# Patient Record
Sex: Female | Born: 1935 | Race: White | Hispanic: No | Smoking: Never smoker
Health system: Southern US, Community
[De-identification: ages and names within clinical notes are randomized; demographics above are authoritative.]

## PROBLEM LIST (undated history)

## (undated) DIAGNOSIS — I10 Essential (primary) hypertension: Secondary | ICD-10-CM

## (undated) DIAGNOSIS — F0390 Unspecified dementia without behavioral disturbance: Secondary | ICD-10-CM

---

## 2013-06-23 ENCOUNTER — Inpatient Hospital Stay: Payer: Self-pay | Admitting: Orthopedic Surgery

## 2013-06-23 LAB — URINALYSIS, COMPLETE
Bacteria: NONE SEEN
Bilirubin,UR: NEGATIVE
Blood: NEGATIVE
GLUCOSE, UR: NEGATIVE mg/dL (ref 0–75)
Leukocyte Esterase: NEGATIVE
Nitrite: NEGATIVE
Ph: 5 (ref 4.5–8.0)
Protein: NEGATIVE
Specific Gravity: 1.024 (ref 1.003–1.030)
Squamous Epithelial: <1
WBC UR: 1 /HPF (ref 0–5)

## 2013-06-23 LAB — CBC
HCT: 39.9 % (ref 35.0–47.0)
HGB: 13.8 g/dL (ref 12.0–16.0)
MCH: 29.2 pg (ref 26.0–34.0)
MCHC: 34.5 g/dL (ref 32.0–36.0)
MCV: 85 fL (ref 80–100)
PLATELETS: 178 10*3/uL (ref 150–440)
RBC: 4.71 10*6/uL (ref 3.80–5.20)
RDW: 13.6 % (ref 11.5–14.5)
WBC: 8.6 10*3/uL (ref 3.6–11.0)

## 2013-06-23 LAB — COMPREHENSIVE METABOLIC PANEL
Albumin: 3.4 g/dL (ref 3.4–5.0)
Alkaline Phosphatase: 75 U/L
Anion Gap: 5 — ABNORMAL LOW (ref 7–16)
BUN: 16 mg/dL (ref 7–18)
Bilirubin,Total: 0.7 mg/dL (ref 0.2–1.0)
CO2: 29 mmol/L (ref 21–32)
Calcium, Total: 9 mg/dL (ref 8.5–10.1)
Chloride: 107 mmol/L (ref 98–107)
Creatinine: 0.84 mg/dL (ref 0.60–1.30)
EGFR (African American): >60
EGFR (Non-African Amer.): >60
Glucose: 96 mg/dL (ref 65–99)
OSMOLALITY: 282 (ref 275–301)
POTASSIUM: 3.6 mmol/L (ref 3.5–5.1)
SGOT(AST): 33 U/L (ref 15–37)
SGPT (ALT): 23 U/L (ref 12–78)
SODIUM: 141 mmol/L (ref 136–145)
Total Protein: 6.9 g/dL (ref 6.4–8.2)

## 2013-06-23 LAB — APTT
ACTIVATED PTT: 23.9 s (ref 23.6–35.9)
Activated PTT: <23 secs — ABNORMAL LOW (ref 23.6–35.9)

## 2013-06-23 LAB — PROTIME-INR
INR: 1.1
INR: 1.1
PROTHROMBIN TIME: 13.6 s (ref 11.5–14.7)
Prothrombin Time: 13.9 secs (ref 11.5–14.7)

## 2013-06-24 LAB — CBC WITH DIFFERENTIAL/PLATELET
Basophil #: 0 10*3/uL (ref 0.0–0.1)
Basophil %: 0.5 %
Eosinophil #: 0.1 10*3/uL (ref 0.0–0.7)
Eosinophil %: 1.5 %
HCT: 36.2 % (ref 35.0–47.0)
HGB: 12.4 g/dL (ref 12.0–16.0)
LYMPHS ABS: 1.1 10*3/uL (ref 1.0–3.6)
LYMPHS PCT: 14.6 %
MCH: 28.7 pg (ref 26.0–34.0)
MCHC: 34.2 g/dL (ref 32.0–36.0)
MCV: 84 fL (ref 80–100)
Monocyte #: 1 x10 3/mm — ABNORMAL HIGH (ref 0.2–0.9)
Monocyte %: 13.7 %
NEUTROS PCT: 69.7 %
Neutrophil #: 5.2 10*3/uL (ref 1.4–6.5)
Platelet: 157 10*3/uL (ref 150–440)
RBC: 4.32 10*6/uL (ref 3.80–5.20)
RDW: 13.3 % (ref 11.5–14.5)
WBC: 7.5 10*3/uL (ref 3.6–11.0)

## 2013-06-24 LAB — BASIC METABOLIC PANEL
Anion Gap: 6 — ABNORMAL LOW (ref 7–16)
BUN: 16 mg/dL (ref 7–18)
CALCIUM: 8.6 mg/dL (ref 8.5–10.1)
CHLORIDE: 107 mmol/L (ref 98–107)
Co2: 27 mmol/L (ref 21–32)
Creatinine: 0.8 mg/dL (ref 0.60–1.30)
EGFR (Non-African Amer.): >60
Glucose: 101 mg/dL — ABNORMAL HIGH (ref 65–99)
OSMOLALITY: 281 (ref 275–301)
Potassium: 3.1 mmol/L — ABNORMAL LOW (ref 3.5–5.1)
Sodium: 140 mmol/L (ref 136–145)

## 2013-06-24 LAB — MAGNESIUM: MAGNESIUM: 1.5 mg/dL — AB

## 2013-06-25 LAB — CBC WITH DIFFERENTIAL/PLATELET
BASOS ABS: 0 10*3/uL (ref 0.0–0.1)
Basophil %: 0.1 %
EOS ABS: 0 10*3/uL (ref 0.0–0.7)
Eosinophil %: 0 %
HCT: 30.6 % — AB (ref 35.0–47.0)
HGB: 10.8 g/dL — AB (ref 12.0–16.0)
Lymphocyte #: 0.6 10*3/uL — ABNORMAL LOW (ref 1.0–3.6)
Lymphocyte %: 5.5 %
MCH: 29.3 pg (ref 26.0–34.0)
MCHC: 35.2 g/dL (ref 32.0–36.0)
MCV: 83 fL (ref 80–100)
MONO ABS: 0.7 x10 3/mm (ref 0.2–0.9)
MONOS PCT: 6.9 %
NEUTROS ABS: 9.1 10*3/uL — AB (ref 1.4–6.5)
NEUTROS PCT: 87.5 %
Platelet: 134 10*3/uL — ABNORMAL LOW (ref 150–440)
RBC: 3.67 10*6/uL — AB (ref 3.80–5.20)
RDW: 13.1 % (ref 11.5–14.5)
WBC: 10.4 10*3/uL (ref 3.6–11.0)

## 2013-06-25 LAB — BASIC METABOLIC PANEL
Anion Gap: 5 — ABNORMAL LOW (ref 7–16)
BUN: 10 mg/dL (ref 7–18)
CHLORIDE: 109 mmol/L — AB (ref 98–107)
CREATININE: 0.81 mg/dL (ref 0.60–1.30)
Calcium, Total: 7.8 mg/dL — ABNORMAL LOW (ref 8.5–10.1)
Co2: 27 mmol/L (ref 21–32)
EGFR (African American): >60
EGFR (Non-African Amer.): >60
Glucose: 135 mg/dL — ABNORMAL HIGH (ref 65–99)
Osmolality: 282 (ref 275–301)
POTASSIUM: 3.8 mmol/L (ref 3.5–5.1)
Sodium: 141 mmol/L (ref 136–145)

## 2013-06-25 LAB — MAGNESIUM: Magnesium: 2.2 mg/dL

## 2013-06-26 LAB — CBC WITH DIFFERENTIAL/PLATELET
Basophil #: 0 10*3/uL (ref 0.0–0.1)
Basophil %: 0.3 %
Eosinophil #: 0.2 10*3/uL (ref 0.0–0.7)
Eosinophil %: 1.7 %
HCT: 28.6 % — AB (ref 35.0–47.0)
HGB: 9.6 g/dL — ABNORMAL LOW (ref 12.0–16.0)
Lymphocyte #: 1.9 10*3/uL (ref 1.0–3.6)
Lymphocyte %: 20.8 %
MCH: 28.5 pg (ref 26.0–34.0)
MCHC: 33.6 g/dL (ref 32.0–36.0)
MCV: 85 fL (ref 80–100)
MONO ABS: 1 x10 3/mm — AB (ref 0.2–0.9)
Monocyte %: 11.5 %
NEUTROS PCT: 65.7 %
Neutrophil #: 5.9 10*3/uL (ref 1.4–6.5)
Platelet: 147 10*3/uL — ABNORMAL LOW (ref 150–440)
RBC: 3.38 10*6/uL — AB (ref 3.80–5.20)
RDW: 13.6 % (ref 11.5–14.5)
WBC: 8.9 10*3/uL (ref 3.6–11.0)

## 2013-06-26 LAB — MAGNESIUM: Magnesium: 1.8 mg/dL

## 2013-06-26 LAB — POTASSIUM: POTASSIUM: 3.8 mmol/L (ref 3.5–5.1)

## 2013-06-27 LAB — CBC WITH DIFFERENTIAL/PLATELET
Basophil #: 0 10*3/uL (ref 0.0–0.1)
Basophil %: 0.5 %
EOS ABS: 0.2 10*3/uL (ref 0.0–0.7)
EOS PCT: 3.2 %
HCT: 27.7 % — ABNORMAL LOW (ref 35.0–47.0)
HGB: 9.5 g/dL — ABNORMAL LOW (ref 12.0–16.0)
LYMPHS PCT: 25.9 %
Lymphocyte #: 1.8 10*3/uL (ref 1.0–3.6)
MCH: 29.4 pg (ref 26.0–34.0)
MCHC: 34.4 g/dL (ref 32.0–36.0)
MCV: 86 fL (ref 80–100)
Monocyte #: 0.9 x10 3/mm (ref 0.2–0.9)
Monocyte %: 13.4 %
NEUTROS PCT: 57 %
Neutrophil #: 4 10*3/uL (ref 1.4–6.5)
Platelet: 146 10*3/uL — ABNORMAL LOW (ref 150–440)
RBC: 3.24 10*6/uL — ABNORMAL LOW (ref 3.80–5.20)
RDW: 13.5 % (ref 11.5–14.5)
WBC: 7 10*3/uL (ref 3.6–11.0)

## 2014-09-09 NOTE — Op Note (Signed)
PATIENT NAME:  [name] [name], [name] [name] MR#:  [id number] DATE OF BIRTH:  [birth year]  DATE OF PROCEDURE:  06/24/2013  PREOPERATIVE DIAGNOSIS: Right tibia and fibula fracture, closed.   POSTOPERATIVE DIAGNOSIS: Right tibia and fibula fracture, closed.   PROCEDURE: Intramedullary fixation of right tibia fracture.   ANESTHESIA: General with LMA.   SURGEON: Juanell Fairly, M.D.   ESTIMATED BLOOD LOSS: 100 mL.   COMPLICATIONS: None.   IMPLANT: Biomet 10 mm x 330 mm VersaNail.   INDICATIONS FOR THE PROCEDURE: The patient sustained a fall when she tripped over a curb. She sustained a spiral fracture in the distal third of her tibia with an associated fracture of the proximal fibula with mild displacement. This 81 year old female was very active and a community ambulator prior to her injury. I had recommended an intramedullary fixation for her fracture. I reviewed the risks and benefits of surgery with the patient and her family. The risks include infection, bleeding, nerve or blood vessel injury, nonunion, malunion, hardware failure, knee or ankle pain, failure to return to ambulation and the need for further surgery. Medical risks include, but are not limited to, DVT and pulmonary embolism, myocardial infarction, stroke, pneumonia, respiratory failure and death. The patient understood these risks and wished to proceed with surgery.   PROCEDURE NOTE: The patient was brought to the operating room where she underwent general anesthesia with placement of an LMA. The patient had had her right leg marked with the word yes by me according to the hospital's right site protocol. She was prepped and draped in a sterile fashion. A timeout was performed to verify the patient's name, date of birth, medical record number, correct site of surgery and correct procedure to be performed. It was also used to verify the patient had received antibiotics and that all appropriate instruments, implants and radiographic studies were  available in the room. Once all in attendance were in agreement, the case began.   The patient's right leg was placed on a triangle with distal traction. The ankle had been Cobaned to the distal leg holder on the triangle. The patient had a closed reduction using C-arm at the start of the case. Once adequate reduction of the fracture had been achieved, a midline incision inferior to the patella was made with a #10 blade. A deep #10 blade was then used to make a small medial arthrotomy, taking care not to injure the patella tendon. A guidepin was then drilled through the proximal tibia and advanced into the tibial canal. The guidepin position was confirmed using C-arm imaging in both the AP and lateral planes. Once adequate position of the drill pin was achieved, a starting awl was advanced over the guidepin and into the proximal tibia. Once the starting hole had been created, a ball-tip guidewire was advanced into the proximal tibia, down the shaft and across the fracture site. The distal portion of the ball-tip guidewire was evaluated again using C-arm in both the AP and lateral planes. It was found to be crossing the fracture site. A depth gauge was used to measure the guidepin length, and it was found to be 330 mm long. Sequential reamers were then passed over the ball-tip guidewire and across the fracture site. The patient had significant reamer chatter at approximately 11.5 mm reamer. The decision was made, therefore, to insert a 10 mm diameter x 330 mm tibial VersaNail. This was advanced into position, malleting the inserter gently. The position of the nail was checked at the ankle, the knee  and at the fracture site both in the AP and lateral planes. Once it was determined that the nail was in adequate position and the fracture remained well reduced, the proximal guide arm was used to insert an oblique proximal interlocking screw as well as a screw through the dynamic hole laterally. The length of these  screws was measured with a depth gauge prior to their insertion. These screws were inserted through the guide arm, allowing for accurate placement. The proximal guide arm was then removed. Attention was then turned to placement of distal interlocking screws. Two distal interlocking screws were placed from a medial to lateral direction using a perfect circle freehand technique. Again, the depth of the screws was measured using a depth gauge. Once both screws were in position, final images of the intramedullary nail and fracture reduction were performed using C-arm. All wounds were then copiously irrigated. The distal and proximal interlocking stab wounds were closed with skin staples. The medial arthrotomy was closed with 0 Vicryl, the subcutaneous tissue closed with 2-0 Vicryl and the skin approximated with staples. Dry sterile dressings were applied, along with an AO splint. The patient was then awakened, transferred to a hospital bed and brought to the PACU in stable condition. I was scrubbed and present for the entire case, and all sharp and instrument counts were correct at the conclusion of the case. I spoke with the patient's family in the postop consultation room to let them know the case had gone without complication and the patient was stable in the recovery room.    ____________________________ Kathreen DevoidKevin L. Nekia Maxham, MD klk:gb D: 06/29/2013 22:31:53 ET T: 06/29/2013 23:19:41 ET JOB#: [id number]  cc: Kathreen DevoidKevin L. Tywanda Rice, MD, <Dictator> Kathreen DevoidKEVIN L Denyla Cortese MD ELECTRONICALLY SIGNED 06/30/2013 10:43

## 2014-09-09 NOTE — H&P (Signed)
Subjective/Chief Complaint Right leg pain.   History of Present Illness Patient is an 81 year old female who tripped on a curb today sustaining an injury to her right leg.  She denies other injuries or LOC.  She is seen in the ER with her son and daughter in law.  Patient denies numbness or tingling in the right leg.  She denies any significant pain and states she has not required pain medication.  A posterior splint was placed on the leg.   Past Med/Surgical Hx:  Hypertension:   hysterectomy:   Rt hip replacement:   ALLERGIES:  NKDA: None  HOME MEDICATIONS: Medication Instructions Status  dorzolamide-timolol ophthalmic 2.23%-0.68% ophthalmic solution 1 drop(s) to each affected eye 2 times a day Active  hydrochlorothiazide-valsartan 12.5 mg-320 mg oral tablet 1 tab(s) orally once a day Active  potassium chloride 20 mEq oral tablet, extended release 1 tab(s) orally once a day Active  Taztia XT 180 mg/24 hours oral capsule, extended release 1 cap(s) orally once a day Active   Family and Social History:  Social History negative tobacco   Place of Living Home   Review of Systems:  Subjective/Chief Complaint Right leg pain   Physical Exam:  GEN no acute distress   HEENT hearing intact to voice, Oropharynx clear, hard of hearing   RESP normal resp effort   EXTR Right lower leg is wrapped in a posterior splint which only covers the leg.  The foot is completely uncovered.  Patient has palpable pedal pulses and the foot is warm and well perfused.  She has intact sensation to light touch in the right foot and she has intact motor function throughout the right foot/ankle.   SKIN normal to palpation   NEURO motor/sensory function intact   PSYCH A+O to time, place, person   Lab Results: Hepatic:  05-Feb-15 15:13   Bilirubin, Total 0.7  Alkaline Phosphatase 75 (45-117 NOTE: New Reference Range 04/08/13)  SGPT (ALT) 23  SGOT (AST) 33  Total Protein, Serum 6.9  Albumin, Serum  3.4  Routine BB:  05-Feb-15 15:13   ABO Group + Rh Type A Positive  Antibody Screen NEGATIVE (Result(s) reported on 23 Jun 2013 at 04:36PM.)  Routine Chem:  05-Feb-15 15:13   BUN 16  Creatinine (comp) 0.84  Sodium, Serum 141  Potassium, Serum 3.6  Chloride, Serum 107  CO2, Serum 29  Calcium (Total), Serum 9.0  Osmolality (calc) 282  eGFR (African American) >60  eGFR (Non-African American) >60 (eGFR values <58m/min/1.73 m2 may be an indication of chronic kidney disease (CKD). Calculated eGFR is useful in patients with stable renal function. The eGFR calculation will not be reliable in acutely ill patients when serum creatinine is changing rapidly. It is not useful in  patients on dialysis. The eGFR calculation may not be applicable to patients at the low and high extremes of body sizes, pregnant women, and vegetarians.)  Anion Gap  5  Routine UA:  05-Feb-15 20:43   Color (UA) Yellow  Clarity (UA) Clear  Glucose (UA) Negative  Bilirubin (UA) Negative  Ketones (UA) 1+  Specific Gravity (UA) 1.024  Blood (UA) Negative  pH (UA) 5.0  Protein (UA) Negative  Nitrite (UA) Negative  Leukocyte Esterase (UA) Negative (Result(s) reported on 23 Jun 2013 at 09:31PM.)  RBC (UA) 1 /HPF  WBC (UA) 1 /HPF  Bacteria (UA) NONE SEEN  Epithelial Cells (UA) <1 /HPF  Mucous (UA) PRESENT (Result(s) reported on 23 Jun 2013 at 09:31PM.)  Routine  Coag:  05-Feb-15 15:13   Prothrombin 13.9  INR 1.1 (INR reference interval applies to patients on anticoagulant therapy. A single INR therapeutic range for coumarins is not optimal for all indications; however, the suggested range for most indications is 2.0 - 3.0. Exceptions to the INR Reference Range may include: Prosthetic heart valves, acute myocardial infarction, prevention of myocardial infarction, and combinations of aspirin and anticoagulant. The need for a higher or lower target INR must be assessed individually. Reference: The Pharmacology  and Management of the Vitamin K  antagonists: the seventh ACCP Conference on Antithrombotic and Thrombolytic Therapy. XBLTJ.0300 Sept:126 (3suppl): N9146842. A HCT value >55% may artifactually increase the PT.  In one study,  the increase was an average of 25%. Reference:  "Effect on Routine and Special Coagulation Testing Values of Citrate Anticoagulant Adjustment in Patients with High HCT Values." American Journal of Clinical Pathology 2006;126:400-405.)  Activated PTT (APTT) 23.9 (A HCT value >55% may artifactually increase the APTT. In one study, the increase was an average of 19%. Reference: "Effect on Routine and Special Coagulation Testing Values of Citrate Anticoagulant Adjustment in Patients with High HCT Values." American Journal of Clinical Pathology 2006;126:400-405.)    21:19   Prothrombin 13.6  INR 1.1 (INR reference interval applies to patients on anticoagulant therapy. A single INR therapeutic range for coumarins is not optimal for all indications; however, the suggested range for most indications is 2.0 - 3.0. Exceptions to the INR Reference Range may include: Prosthetic heart valves, acute myocardial infarction, prevention of myocardial infarction, and combinations of aspirin and anticoagulant. The need for a higher or lower target INR must be assessed individually. Reference: The Pharmacology and Management of the Vitamin K  antagonists: the seventh ACCP Conference on Antithrombotic and Thrombolytic Therapy. PQZRA.0762 Sept:126 (3suppl): N9146842. A HCT value >55% may artifactually increase the PT.  In one study,  the increase was an average of 25%. Reference:  "Effect on Routine and Special Coagulation Testing Values of Citrate Anticoagulant Adjustment in Patients with High HCT Values." American Journal of Clinical Pathology 2006;126:400-405.)  Activated PTT (APTT)  < 23.0 (A HCT value >55% may artifactually increase the APTT. In one study, the increase was an  average of 19%. Reference: "Effect on Routine and Special Coagulation Testing Values of Citrate Anticoagulant Adjustment in Patients with High HCT Values." American Journal of Clinical Pathology 2006;126:400-405.)  Routine Hem:  05-Feb-15 15:13   WBC (CBC) 8.6  RBC (CBC) 4.71  Hemoglobin (CBC) 13.8  Hematocrit (CBC) 39.9  Platelet Count (CBC) 178 (Result(s) reported on 23 Jun 2013 at 03:38PM.)  MCV 85  MCH 29.2  MCHC 34.5  RDW 13.6   Radiology Results: XRay:    05-Feb-15 14:07, Tibia And Fibula Right  Tibia And Fibula Right  REASON FOR EXAM:    truama, swelling, pain, fall  COMMENTS:   LMP: Post Hysterectomy    PROCEDURE: DXR - DXR TIBIA AND FIBULA RT (LOWER L  - Jun 23 2013  2:07PM     CLINICAL DATA:  [place] and injured right lower leg.    EXAM:  RIGHT TIBIA AND FIBULA - 2 VIEW    COMPARISON:  None.    FINDINGS:  Comminuted oblique fracture involving the distal tibial metaphysis  with slight lateral and posterior displacement of the distal  fragment. Comminuted oblique fracture involving the proximal fibular  metaphysis with slight lateral and anterior displacement of the  distal fragment. Mild osseous demineralization. Sclerotic focus  involving the medial cortex of the  distal fibular metaphysis likely  an old healed fibroma or a large bone island. Visualized knee joint  and ankle joint intact.     IMPRESSION:  1. Comminuted oblique fracture involving the distal tibial  metaphysis with lateral and posterior displacement of the distal  fragment.  2. Comminuted oblique fracture involving the proximal fibular  metaphysis with lateral and anterior displacement of the distal  fragment.  Electronically Signed    By: Evangeline Dakin M.D.    On: 06/23/2013 14:20         Verified By: Deniece Portela, M.D.,  LabUnknown:  PACS Image    Assessment/Admission Diagnosis Right tibia and fibula fracture, closed   Plan Patient has an oblique fracture in the distal  tibia and a proximal fibula fracture which is mildly displaced.  I have explained the injury to the patient and her family.  I have recommended intramedullary rod fixation for the tibia fracture.  The surgery is scheduled for tomorrow at 11 AM pending medical clearance.  I have contact the hosptialist, Dr. Laurin Coder, to see the patient for pre-op clearance.  I have reviewed the radiograph and lab studies.  The risks and benefits of surgical intervention were discussed in detail with the patient and her family and they expressed understanding of the risks and benefits and agreed with plans for surgery. The risks include, but are not limited to: infection, bleeding requiring transfusion, nerve and blood vessel injury , fracture, leg length discrepancy, malunion, nonunion, persistent leg or knee pain, harware failure or painful hardware, DVT, and PE, MI, stroke, pneumonia, respiratory failure and death.  The patient is being admitted to my service.  She will be NPO after midnight.   Electronic Signatures: [place] Park (MD)  (Signed 05-Feb-15 22:36)  Authored: CHIEF COMPLAINT and HISTORY, PAST MEDICAL/SURGIAL HISTORY, ALLERGIES, HOME MEDICATIONS, FAMILY AND SOCIAL HISTORY, REVIEW OF SYSTEMS, PHYSICAL EXAM, LABS, Radiology, ASSESSMENT AND PLAN   Last Updated: 05-Feb-15 22:36 by Thornton Park (MD)

## 2014-09-09 NOTE — Discharge Summary (Signed)
PATIENT NAME:  [name] [name], Lamonda A MR#:  [id number] DATE OF BIRTH:  [birth year]  DATE OF ADMISSION:  06/23/2013 DATE OF DISCHARGE:  06/27/2013  REASON FOR ADMISSION: Right tibia and fibula fracture, closed.   HISTORY OF PRESENT ILLNESS: Ms. [name] [name] is an 81 year old female who tripped on a curb and sustained an injury to her right lower leg. She had no other injuries. She was diagnosed with a tibia and fibula fracture in the ER by x-ray. She was admitted to orthopedics after being placed in a posterior splint for further management.   PAST MEDICAL HISTORY: Includes hypertension, hysterectomy, and right hip replacement.   ALLERGIES: No known drug allergies.   HOME MEDICATIONS: Include hydrochlorothiazide/valsartan 12.5 mg/320 mg p.o. daily, potassium chloride 20 mEq p.o. daily, Taztia XT 100 mg oral extended-release capsule 1 tablet daily, and dorzolamide/timolol ophthalmic solution one drop to each eye b.i.d.   HOSPITAL COURSE: The patient was admitted on June 23, 2013. She was seen by the hospitalist service and cleared for surgery. The patient was then brought to the operating room on June 24, 2013, and underwent an uncomplicated intramedullary fixation of the right tibia fracture. She was placed in an AO splint and returned to the orthopedic floor postoperatively. On postop day #1, she received 24 hours of antibiotics. Her dressing remained clean, dry and intact, and she was neurovascularly intact. Her pain was well controlled. She was out of bed to a chair. On postop day #2, the patient's Foley catheter was removed. She remained on Lovenox for DVT prophylaxis. She again was up and out of bed to a chair and improving with physical therapy. She had physical and occupational therapy consults, which began on postop day #1 and continued throughout her hospitalization. Her laboratories including a CBC were checked daily. The patient did not require any postop transfusions. Her DVT prophylaxis included  Lovenox, TED stockings and A-V I foot compressors. She also continued on incentive spirometry throughout her hospitalization. The hospitalists continued to follow the patient throughout her hospitalization as well. They temporarily stopped her hydrochlorothiazide during her hospitalization, as she had hypokalemia and a low magnesium. The patient continued to progress well with physical therapy. Given her pain control and clinical improvement and progress with physical therapy, she was then prepared for discharge on postop day #3.   DISCHARGE INSTRUCTIONS: The patient was discharged with instructions to remain touchdown weight-bearing on the right lower extremity using a walker. She should elevate and continue to ice her right lower extremity whenever possible. She will keep her bandage and splint on until she follows up in the office in approximately 10 days. She will continue to elevate her heels and continue to use her incentive spirometer. The patient has been arranged with home physical therapy. She was discharged on Norco 325 mg/5 mg tablets with instructions to take 1 to 2 tabs every 4 to 6 hours p.r.n. for pain. She was sent home with magnesium hydroxide 80% oral suspension, Lovenox 30 mg b.i.d., Colace 100 mg b.i.d., iron 325 mg daily, valsartan 160 mg daily, magnesium oxide 400 mg p.o. daily, dorzolamide/timolol ophthalmic solution one drop to each eye b.i.d., Taztia XT 180 mg p.o. daily, hydrochlorothiazide/valsartan 12.5 mg/320 mg daily, and potassium 20 mEq p.o. daily. The patient understood the discharge plan.   ____________________________ Kathreen DevoidKevin L. Andree Heeg, MD klk:jcm D: 07/12/2013 15:57:52 ET T: 07/12/2013 17:02:42 ET JOB#: [id number]  cc: Kathreen DevoidKevin L. Rishabh Rinkenberger, MD, <Dictator> Kathreen DevoidKEVIN L Kresha Abelson MD ELECTRONICALLY SIGNED 07/19/2013 18:15

## 2014-09-09 NOTE — Consult Note (Signed)
PATIENT NAME:  [name] [name], [name] [name] MR#:  [id number] DATE OF BIRTH:  Aug 24, 1928  DATE OF CONSULTATION:  06/24/2013  REFERRING PHYSICIAN:   CONSULTING PHYSICIAN:  Ramonita Lab, MD  REASON FOR CONSULTATION:  Preoperative clearance.   HISTORY OF PRESENT ILLNESS:  The patient is an 81 year old pleasant female with past medical history of hypertension, was admitted to Dr. Samuel Germany service after she got tripped on a curb and sustained an injury to her right leg.  She denies any dizziness or loss of consciousness.  The patient was seen in the ER, diagnosed with right comminuted tibial and fibular fracture, admitted to Dr. Samuel Germany service.  Medical consult was placed to [facility].  The patient denies any chest pain or shortness of breath.  No cardiac problems in the past.  She is resting well and pain is well-controlled.   PAST MEDICAL HISTORY:  Hypertension.  Glaucoma.   PAST SURGICAL HISTORY:  Hip replacement.   ALLERGIES:  No known drug allergies.   PSYCHOSOCIAL HISTORY:  Lives alone.  Son lives next door.  No history of smoking, alcohol or illicit drug usage.   FAMILY HISTORY:  Hypertension runs in her family.   REVIEW OF SYSTEMS:  CONSTITUTIONAL:  Denies any fever, fatigue.  EYES:  Denies blurry vision, double vision.  EARS, NOSE, THROAT:  No epistaxis, discharge.  RESPIRATION:  Denies cough, COPD.  CARDIOVASCULAR:  No chest pain, palpitations.  Denies syncope.  GASTROINTESTINAL:  No nausea, vomiting, diarrhea.  GENITOURINARY:  No dysuria or hematuria.  GYNECOLOGIC:  Denies breast mass or vaginal discharge.  ENDOCRINE:  Denies polyuria, nocturia, thyroid problems.  HEMATOLOGIC AND LYMPHATIC:  No anemia, easy bruising, bleeding.  INTEGUMENTARY:  No acne, rash, lesions.  MUSCULOSKELETAL:  No joint pain in the neck and back.  Complaining of right lower extremity pain, probably from recent fracture. NEUROLOGIC:  No vertigo, ataxia. PSYCHIATRIC:  No ADD, OCD.    HOME MEDICATIONS:  Potassium  chloride 20 mEq by mouth once a day, hydrochlorothiazide 12.5 mg/320 by mouth once daily, dorzolamide, timolol one drop each affected eye.   PHYSICAL EXAMINATION: VITAL SIGNS:  Temperature 98 degrees Fahrenheit, pulse 87, respirations 18, blood pressure 166/89, pulse ox 94% on 2 liters.  GENERAL APPEARANCE:  Not under acute distress.  Moderately built and nourished.  HEENT:  Normocephalic, atraumatic.  Pupils are equally reactive to light and accommodation.  No scleral icterus.  No conjunctival injection.  No sinus tenderness.  No postnasal drip.  Moist mucous membranes.  NECK:  Supple.  No JVD.  No thyromegaly.  Range of motion is intact.  LUNGS:  Clear to auscultation bilaterally.  No accessory muscle usage.  No anterior chest wall tenderness on palpation.  CARDIAC:  S1, S2 normal.  Regular rate and rhythm.  No murmurs.  GASTROINTESTINAL:  Soft.  Bowel sounds are positive in all four quadrants.  Nontender, nondistended.  No hepatosplenomegaly.  No masses felt.  NEUROLOGIC:  Awake, alert, oriented x 3.  Cranial nerves II through XII are grossly intact.  Reflexes are 2+.  EXTREMITIES:  Right lower extremity intact in an immobilizer.  Right elbow laceration is cleaned and covered with a clean bandage.  No cyanosis.  No clubbing.  SKIN:  Warm to touch.  Normal turgor.  No rashes.  No lesions.  PSYCHIATRIC:  Normal mood and affect.   LABORATORY AND IMAGING STUDIES:  Portable chest x-ray, no active disease.  A 12-lead EKG:  Normal sinus rhythm with premature atrial complexes.  Normal PR and QRS  interval.  No acute ST-T wave changes.  BNP normal except anion gap at 5.  LFTs normal.  CBC normal.  She is A positive, antibody negative.  PT 13.6, INR 1.1.  Activated PTT 23.0.  Urinalysis yellow in color, nitrite and leukocyte esterase are negative, blood negative, glucose and bili are negative.   ASSESSMENT AND PLAN:  An 81 year old female with right-sided tibia fibular fracture.  Medical consult is placed  to [facility] for preoperative clearance.  The patient is medically cleared for surgery in the a.m.  1.  Hypertension.  Blood pressure is elevated, probably from stress and pain.  Pain management by orthopedics.  We will provide her Lopressor IV as needed basis for systolic blood pressure greater than 160 after providing her pain management.  2.  Glaucoma.  We will resume her eye drops.  3.  Right tibia fibular fracture.  Management by orthopedics.  Pain management by orthopedics.   4.  We will provide her gastrointestinal prophylaxis with Protonix.  5.  Deep vein thrombosis prophylaxis with TEDs for now as the patient is nothing by mouth and scheduled for surgery in a.m.   Plan of care discussed with the patient.  She is aware of the plan.   Total time spent on the consultation is 45 minutes.    ____________________________ Ramonita LabAruna Jann Ra, MD ag:ea D: 06/24/2013 02:54:42 ET T: 06/24/2013 04:46:24 ET JOB#: [id number]  cc: Ramonita LabAruna Peggyann Zwiefelhofer, MD, <Dictator> Kathreen DevoidKevin L. Krasinski, MD Ramonita LabARUNA Maria Coin MD ELECTRONICALLY SIGNED 07/08/2013 1:03

## 2015-04-08 IMAGING — CR DG CHEST 1V PORT
1 series · 1 of 1 positions shown · non-contrast
Comparison: None.

CLINICAL DATA: Preop clearance.

EXAM:
PORTABLE CHEST - 1 VIEW

[ap]
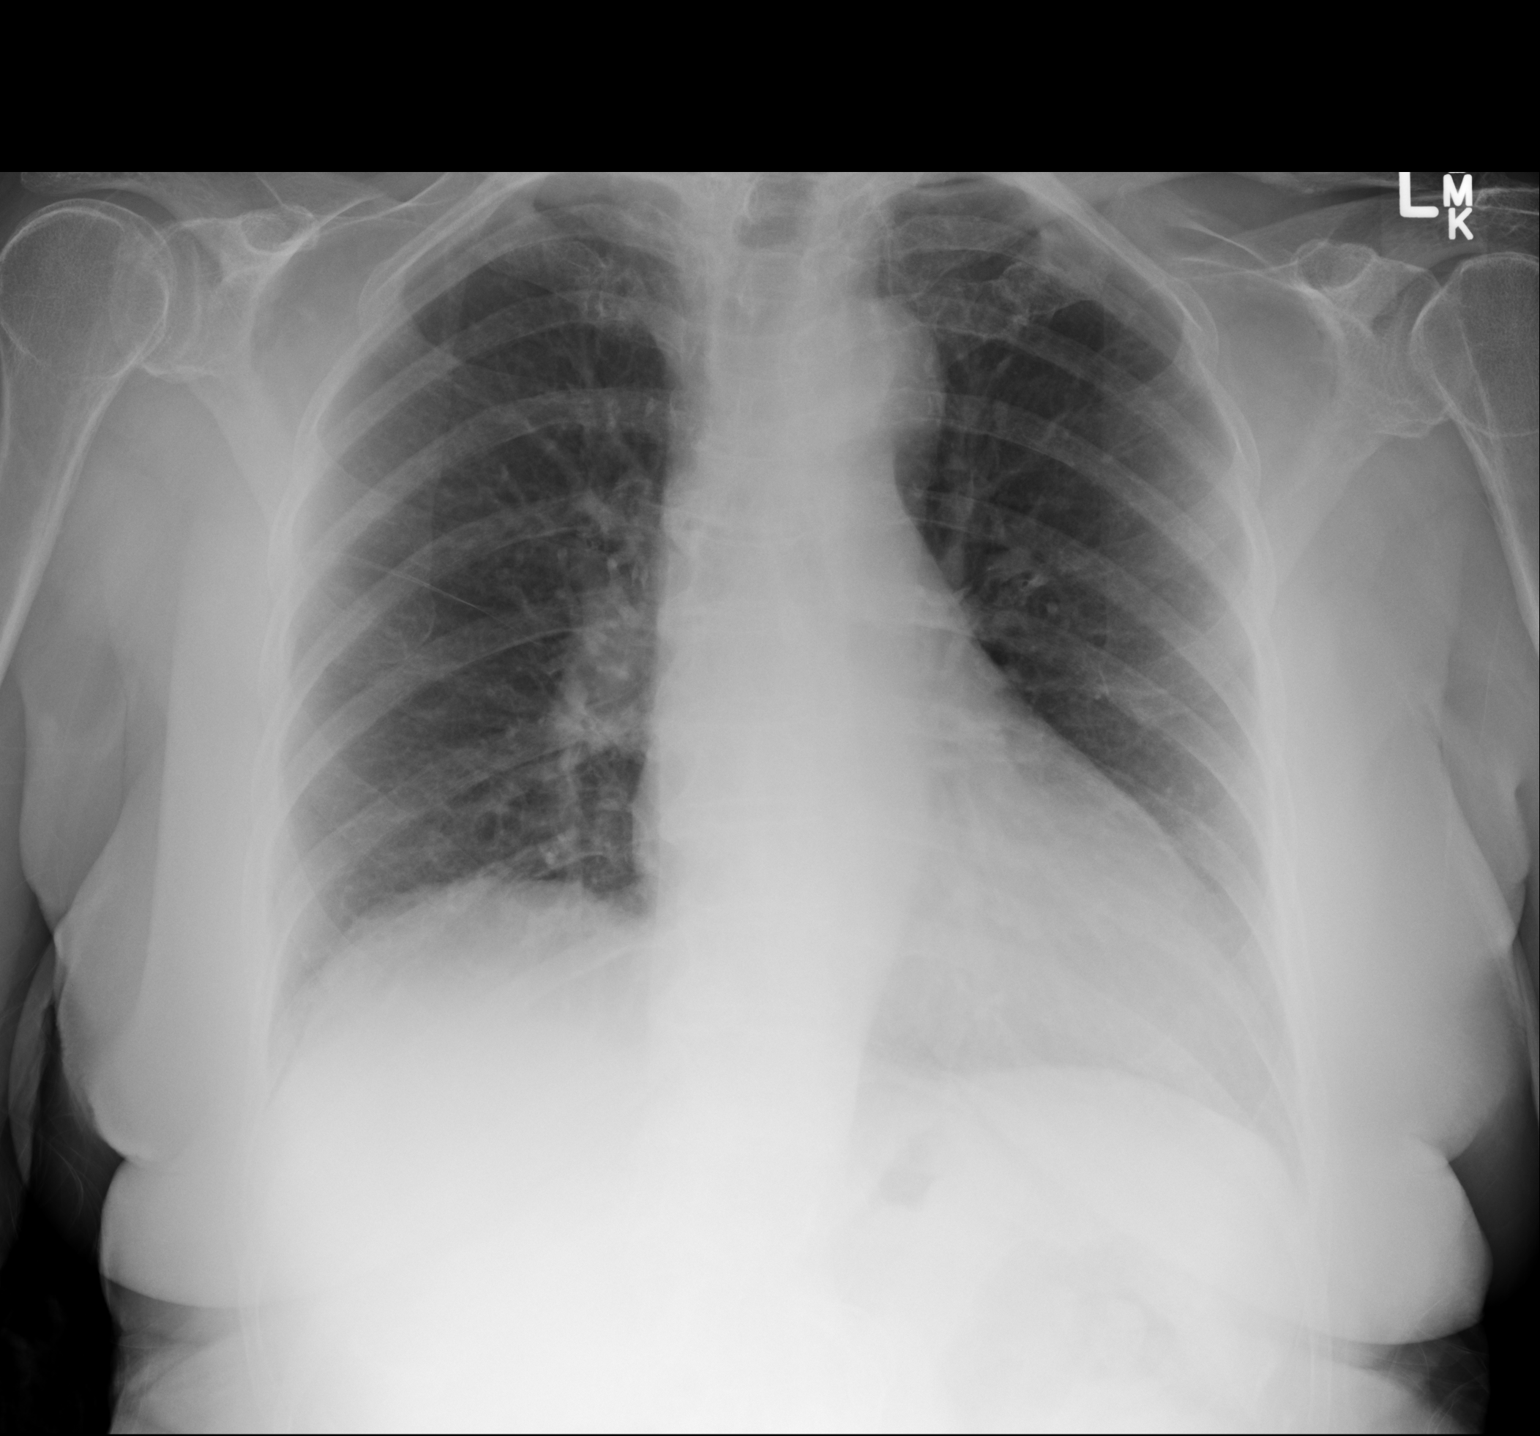

[1 of 1 positions shown; findings below may reference images not displayed]

FINDINGS: Mild cardiomegaly. No edema or consolidation. No effusion or
pneumothorax.
IMPRESSION: No active disease.

## 2017-04-07 ENCOUNTER — Other Ambulatory Visit: Payer: Self-pay | Admitting: Internal Medicine

## 2017-04-07 DIAGNOSIS — R4182 Altered mental status, unspecified: Secondary | ICD-10-CM

## 2017-04-14 ENCOUNTER — Ambulatory Visit
Admission: RE | Admit: 2017-04-14 | Discharge: 2017-04-14 | Disposition: A | Discharge status: Home / Self Care | Payer: Medicare Other | Source: Ambulatory Visit | Attending: Internal Medicine | Admitting: Internal Medicine

## 2017-04-14 DIAGNOSIS — R4182 Altered mental status, unspecified: Secondary | ICD-10-CM | POA: Insufficient documentation

## 2018-02-06 ENCOUNTER — Emergency Department: Payer: Medicare Other

## 2018-02-06 ENCOUNTER — Emergency Department
Admission: EM | Admit: 2018-02-06 | Discharge: 2018-02-06 | Disposition: A | Discharge status: Home / Self Care | Payer: Medicare Other | Attending: Emergency Medicine | Admitting: Emergency Medicine

## 2018-02-06 ENCOUNTER — Encounter: Payer: Self-pay | Admitting: Emergency Medicine

## 2018-02-06 ENCOUNTER — Other Ambulatory Visit: Payer: Self-pay

## 2018-02-06 DIAGNOSIS — R8290 Unspecified abnormal findings in urine: Secondary | ICD-10-CM | POA: Diagnosis not present

## 2018-02-06 DIAGNOSIS — Z23 Encounter for immunization: Secondary | ICD-10-CM | POA: Diagnosis not present

## 2018-02-06 DIAGNOSIS — I10 Essential (primary) hypertension: Secondary | ICD-10-CM | POA: Insufficient documentation

## 2018-02-06 DIAGNOSIS — F0390 Unspecified dementia without behavioral disturbance: Secondary | ICD-10-CM | POA: Insufficient documentation

## 2018-02-06 DIAGNOSIS — W19XXXA Unspecified fall, initial encounter: Secondary | ICD-10-CM

## 2018-02-06 DIAGNOSIS — Z043 Encounter for examination and observation following other accident: Secondary | ICD-10-CM | POA: Insufficient documentation

## 2018-02-06 HISTORY — DX: Unspecified dementia, unspecified severity, without behavioral disturbance, psychotic disturbance, mood disturbance, and anxiety: F03.90

## 2018-02-06 HISTORY — DX: Essential (primary) hypertension: I10

## 2018-02-06 LAB — CBC
HEMATOCRIT: 38.6 % (ref 35.0–47.0)
Hemoglobin: 13.8 g/dL (ref 12.0–16.0)
MCH: 30.7 pg (ref 26.0–34.0)
MCHC: 35.8 g/dL (ref 32.0–36.0)
MCV: 85.7 fL (ref 80.0–100.0)
PLATELETS: 153 10*3/uL (ref 150–440)
RBC: 4.5 MIL/uL (ref 3.80–5.20)
RDW: 13.6 % (ref 11.5–14.5)
WBC: 8.9 10*3/uL (ref 3.6–11.0)

## 2018-02-06 LAB — BASIC METABOLIC PANEL
Anion gap: 7 (ref 5–15)
BUN: 21 mg/dL (ref 8–23)
CHLORIDE: 105 mmol/L (ref 98–111)
CO2: 31 mmol/L (ref 22–32)
CREATININE: 0.76 mg/dL (ref 0.44–1.00)
Calcium: 9.4 mg/dL (ref 8.9–10.3)
GFR calc non Af Amer: >60 mL/min (ref >60)
Glucose, Bld: 135 mg/dL — ABNORMAL HIGH (ref 70–99)
Potassium: 3.4 mmol/L — ABNORMAL LOW (ref 3.5–5.1)
Sodium: 143 mmol/L (ref 135–145)

## 2018-02-06 LAB — HEPATIC FUNCTION PANEL
ALBUMIN: 4 g/dL (ref 3.5–5.0)
ALK PHOS: 67 U/L (ref 38–126)
ALT: 23 U/L (ref 0–44)
AST: 48 U/L — ABNORMAL HIGH (ref 15–41)
Bilirubin, Direct: 0.2 mg/dL (ref 0.0–0.2)
Indirect Bilirubin: 1.4 mg/dL — ABNORMAL HIGH (ref 0.3–0.9)
Total Bilirubin: 1.6 mg/dL — ABNORMAL HIGH (ref 0.3–1.2)
Total Protein: 6.6 g/dL (ref 6.5–8.1)

## 2018-02-06 LAB — CK: Total CK: 555 U/L — ABNORMAL HIGH (ref 38–234)

## 2018-02-06 LAB — TROPONIN I
TROPONIN I: 0.04 ng/mL — AB (ref <0.03)
TROPONIN I: 0.04 ng/mL — AB (ref <0.03)

## 2018-02-06 MED ORDER — TETANUS-DIPHTH-ACELL PERTUSSIS 5-2.5-18.5 LF-MCG/0.5 IM SUSP
0.5000 mL | Freq: Once | INTRAMUSCULAR | Status: AC
  Administered 2018-02-06: 0.5 mL via INTRAMUSCULAR
  Filled 2018-02-06: qty 0.5

## 2018-02-06 MED ORDER — CEPHALEXIN 500 MG PO CAPS: 500.0000 mg | ORAL_CAPSULE | Freq: Three times a day (TID) | ORAL | 0 refills | Status: AC

## 2018-02-06 MED ORDER — SODIUM CHLORIDE 0.9 % IV BOLUS
1000.0000 mL | Freq: Once | INTRAVENOUS | Status: AC
  Administered 2018-02-06: 1000 mL via INTRAVENOUS

## 2018-02-06 NOTE — ED Notes (Signed)
Patient transported to X-ray 

## 2018-02-06 NOTE — Discharge Instructions (Addendum)
These follow-up with Dr. Arlana Pouchate as social work discussed with you.  Please return for any further problems.  Try to keep his close an eye on her as you can.  Since her urine smells but she will not give us a urine specimen I will give you some Keflex antibiotic.  Take 1 3 times a day for 5 days.  That should take care of most urinary tract infections.  Please return for any fever, pain or if she starts acting sicker.

## 2018-02-06 NOTE — ED Triage Notes (Addendum)
Pt arrived via POV with son and DIL with reports of multiple falls this week, last fall being today.  Pt was found by son in backyard on her back for an unknown period of time.  Son lives nearby but lives by herself.   Pt reports she fell early this morning when she got up out of the bed, pt unable to recall falling outside.     Pt has strong smelling urine odor as well.  Pt has  Abrasion and ecchymosis noted to left eye.

## 2018-02-06 NOTE — Clinical Social Work Note (Signed)
Clinical Social Work Assessment  Patient Details  Name: [name] [name] MRN: [id number] Date of Birth: 03/26/1929  Date of referral:  02/06/18               Reason for consult:  Facility Placement, Care Management Concerns                Permission sought to share information with:  Family Supports, Magazine features editoracility Contact Representative Permission granted to share information::  Yes, Verbal Permission Granted  Name::     Neale BurlyDavid Cermak and Celesta AverJudy Hennon [phone]  Agency::  All facilities  Relationship::     Contact Information:     Housing/Transportation Living arrangements for the past 2 months:  Single Family Home Source of Information:  Patient Patient Interpreter Needed:  None Criminal Activity/Legal Involvement Pertinent to Current Situation/Hospitalization:  No - Comment as needed Significant Relationships:  Adult Children, Other Family Members, Neighbor Lives with:  Self(Family visits every day) Do you feel safe going back to the place where you live?  Yes Need for family participation in patient care:  Yes (Comment)  Care giving concerns: Son and daughter in law will increase her in home supports and begin looking at memory care facilities   Social Worker assessment / plan: LCSW introduced myself to patient and family and obtained consent to complete assessment. This family is close, she is widowed ahd has I son and daughter in law and grand kids and great grand children. She is very independent and has managed to live alone but in the last few months she is having wandering off issues and memory issues. She does need assistance now with  Her hygiene and the family will not allow her to drive. She needs help dressing. She refuses to use walker. Patient is oriented x2. She can make toast and coffee and may eat small foods. LCSW provided family with resources from [employer] [facility]- SNF list, [facility]F and [facility] and also received personal resource list for 1-1 care givers. The family is going  to follow up with her family doctor to arrange his to write up orders for [facility]- and make recommendations to SNF. LCSW will complete Fl2 and Passr and further support the family.   Employment status:  Retired Health and safety inspectornsurance information:  Medicare([employer]) PT Recommendations:  Not assessed at this time Information / Referral to community resources:     Patient/Family's Response to care:  Would like her safe  Patient/Family's Understanding of and Emotional Response to Diagnosis, Current Treatment, and Prognosis: Patient disoriented to place and time  Emotional Assessment Appearance:  Appears stated age Attitude/Demeanor/Rapport:  Gracious Affect (typically observed):  Calm Orientation:  Oriented to Self, Oriented to  Time(Early signs of dementia) Alcohol / Substance use:  Not Applicable Psych involvement (Current and /or in the community):  No (Comment)  Discharge Needs  Concerns to be addressed:    Readmission within the last 30 days:  No Current discharge risk:  None Barriers to Discharge:  No Barriers Identified   Cheron SchaumannBandi, Kelen Laura M, LCSW 02/06/2018, 5:32 PM

## 2018-02-06 NOTE — ED Notes (Signed)
Pt assisted to the toilet in room. Pt was unable to urinate, will try again

## 2018-02-06 NOTE — ED Notes (Signed)
Social work at bedside.  

## 2018-02-06 NOTE — ED Provider Notes (Signed)
[facility] Emergency Department Provider Note   ____________________________________________   First MD Initiated Contact with Patient 02/06/18 1629     (approximate)  I have reviewed the triage vital signs and the nursing notes.   HISTORY  Chief Complaint Fall    HPI [name] [name] is a 84 y.o. female who fell today out in the backyard and was out there for an unknown period of time.  Son lives nearby but across the past year.  Patient's by herself most of the time.  Patient is fallen 3 times in the last week.  Patient has dementia not taking her medicines.  Family is trying to talk her into going to a nursing home but she has so far refused.  Patient herself says she feels fine.  She has no complaints.   Past Medical History:  Diagnosis Date  . Dementia   . Hypertension     There are no active problems to display for this patient.   History reviewed. No pertinent surgical history.  Prior to Admission medications   Medication Sig Start Date End Date Taking? Authorizing Provider  cephALEXin (KEFLEX) 500 MG capsule Take 1 capsule (500 mg total) by mouth 3 (three) times daily for 10 days. 02/06/18 02/16/18  Arnaldo Natal, MD    Allergies Patient has no known allergies.  History reviewed. No pertinent family history.  Social History Social History   Tobacco Use  . Smoking status: Never Smoker  . Smokeless tobacco: Never Used  Substance Use Topics  . Alcohol use: Not on file  . Drug use: Not on file    Review of Systems  Constitutional: No fever/chills Eyes: No visual changes. ENT: No sore throat. Cardiovascular: Denies chest pain. Respiratory: Denies shortness of breath. Gastrointestinal: No abdominal pain.  No nausea, no vomiting.  No diarrhea.  No constipation. Genitourinary: Negative for dysuria. Musculoskeletal: Negative for back pain. Skin: Negative for rash. Neurological: Negative for headaches, focal  weakness  ____________________________________________   PHYSICAL EXAM:  VITAL SIGNS: ED Triage Vitals [02/06/18 1430]  Enc Vitals Group     BP (!) 152/78     Pulse Rate 86     Resp 18     Temp 98.7 F (37.1 C)     Temp Source Oral     SpO2 99 %     Weight 150 lb (68 kg)     Height 5\' 5"  (1.651 m)     Head Circumference      Peak Flow      Pain Score      Pain Loc      Pain Edu?      Excl. in GC?     Constitutional: Alert and oriented. Well appearing and in no acute distress. Eyes: Conjunctivae are normal.  Head: Atraumatic except for some abrasions and swelling around the left cheek. Nose: No congestion/rhinnorhea. Mouth/Throat: Mucous membranes are moist.  Oropharynx non-erythematous. Neck: No stridor. Cardiovascular: Normal rate, regular rhythm. Grossly normal heart sounds.  Good peripheral circulation. Respiratory: Normal respiratory effort.  No retractions. Lungs CTAB. Gastrointestinal: Soft and nontender. No distention. No abdominal bruits. No CVA tenderness. Musculoskeletal: No lower extremity tenderness bilateral edema left greater than right this is chronic  Neurologic:  Normal speech and language. No gross focal neurologic deficits are appreciated.  Cranial nerves II through XII are intact of the visual fields were not checked cerebellar finger-to-nose is slightly slowed but equal bilaterally motor strength is 5/5 throughout Skin:  Skin is  warm, dry and intact. No rash noted. Psychiatric: Mood and affect are normal. Speech and behavior are normal.  ____________________________________________   LABS (all labs ordered are listed, but only abnormal results are displayed)  Labs Reviewed  BASIC METABOLIC PANEL - Abnormal; Notable for the following components:      Result Value   Potassium 3.4 (*)    Glucose, Bld 135 (*)    All other components within normal limits  CK - Abnormal; Notable for the following components:   Total CK 555 (*)    All other  components within normal limits  HEPATIC FUNCTION PANEL - Abnormal; Notable for the following components:   AST 48 (*)    Total Bilirubin 1.6 (*)    Indirect Bilirubin 1.4 (*)    All other components within normal limits  TROPONIN I - Abnormal; Notable for the following components:   Troponin I 0.04 (*)    All other components within normal limits  TROPONIN I - Abnormal; Notable for the following components:   Troponin I 0.04 (*)    All other components within normal limits  CBC  URINALYSIS, COMPLETE (UACMP) WITH MICROSCOPIC   ____________________________________________  EKG  EKG read and interpreted by me shows normal sinus rhythm rate of 85 normal axis no acute ST-T wave changes ____________________________________________  RADIOLOGY  ED MD interpretation: CT of the head neck and maxillofacial area there negative except for some mild swelling in the soft tissues around the cheek where the abrasions are.  Radiology reads chest x-ray as possible seventh nondisplaced rib fracture.  I reviewed the films.  Official radiology report(s): Dg Chest 2 View  Result Date: 02/06/2018 CLINICAL DATA:  Pt arrived via POV with son and DIL with reports of multiple falls this week, last fall being today. Pt was found by son in backyard on her back for an unknown period of time. Son lives nearby but lives by herself. Pt reports she fell early this morning when she got up out of the bed, pt unable to recall falling outside. Non-smoker. EXAM: CHEST - 2 VIEW COMPARISON:  None. FINDINGS: Cardiac silhouette is top-normal in size. No mediastinal or hilar masses. No evidence of adenopathy. Lungs are clear.  No pleural effusion.  No pneumothorax. Nondisplaced fracture of the right lateral seventh rib. No other convincing fractures. Skeletal structures are demineralized. IMPRESSION: 1. Undisplaced fracture of the right lateral seventh rib. 2. No acute cardiopulmonary disease. No evidence of lung contusion or of a  pneumothorax. Electronically Signed   By: Amie Portland M.D.   On: 02/06/2018 17:13   Ct Head Wo Contrast  Result Date: 02/06/2018 CLINICAL DATA:  84 year old female with head, neck and facial injuries from multiple falls. EXAM: CT HEAD WITHOUT CONTRAST CT MAXILLOFACIAL WITHOUT CONTRAST CT CERVICAL SPINE WITHOUT CONTRAST TECHNIQUE: Multidetector CT imaging of the head, cervical spine, and maxillofacial structures were performed using the standard protocol without intravenous contrast. Multiplanar CT image reconstructions of the cervical spine and maxillofacial structures were also generated. COMPARISON:  04/14/2017 head CT FINDINGS: CT HEAD FINDINGS Brain: No evidence of acute infarction, hemorrhage, hydrocephalus, extra-axial collection or mass lesion/mass effect. Mild chronic small-vessel white matter ischemic changes again noted. Vascular: No hyperdense vessel or unexpected calcification. Skull: Normal. Negative for fracture or focal lesion. Other: None. CT MAXILLOFACIAL FINDINGS Osseous: No fracture or mandibular dislocation. No destructive process. Orbits: No traumatic or inflammatory finding. Sinuses: A small amount of fluid in the LEFT sphenoid sinus noted. Soft tissues: Mild LEFT facial soft tissue  swelling noted. CT CERVICAL SPINE FINDINGS Alignment: Normal. Skull base and vertebrae: No acute fracture. No primary bone lesion or focal pathologic process. Soft tissues and spinal canal: No prevertebral fluid or swelling. No visible canal hematoma. Disc levels: Moderate multilevel degenerative disc disease, spondylosis and facet arthropathy noted. Upper chest: No acute abnormality Other: None IMPRESSION: 1. No evidence of acute intracranial abnormality. Mild chronic small-vessel white matter ischemic changes. 2. Mild LEFT facial soft tissue swelling without acute fracture. 3. No static evidence of acute injury to the cervical spine. Moderate multilevel degenerative changes. Electronically Signed   By:  Harmon PierJeffrey  Hu M.D.   On: 02/06/2018 15:52   Ct Cervical Spine Wo Contrast  Result Date: 02/06/2018 CLINICAL DATA:  84 year old female with head, neck and facial injuries from multiple falls. EXAM: CT HEAD WITHOUT CONTRAST CT MAXILLOFACIAL WITHOUT CONTRAST CT CERVICAL SPINE WITHOUT CONTRAST TECHNIQUE: Multidetector CT imaging of the head, cervical spine, and maxillofacial structures were performed using the standard protocol without intravenous contrast. Multiplanar CT image reconstructions of the cervical spine and maxillofacial structures were also generated. COMPARISON:  04/14/2017 head CT FINDINGS: CT HEAD FINDINGS Brain: No evidence of acute infarction, hemorrhage, hydrocephalus, extra-axial collection or mass lesion/mass effect. Mild chronic small-vessel white matter ischemic changes again noted. Vascular: No hyperdense vessel or unexpected calcification. Skull: Normal. Negative for fracture or focal lesion. Other: None. CT MAXILLOFACIAL FINDINGS Osseous: No fracture or mandibular dislocation. No destructive process. Orbits: No traumatic or inflammatory finding. Sinuses: A small amount of fluid in the LEFT sphenoid sinus noted. Soft tissues: Mild LEFT facial soft tissue swelling noted. CT CERVICAL SPINE FINDINGS Alignment: Normal. Skull base and vertebrae: No acute fracture. No primary bone lesion or focal pathologic process. Soft tissues and spinal canal: No prevertebral fluid or swelling. No visible canal hematoma. Disc levels: Moderate multilevel degenerative disc disease, spondylosis and facet arthropathy noted. Upper chest: No acute abnormality Other: None IMPRESSION: 1. No evidence of acute intracranial abnormality. Mild chronic small-vessel white matter ischemic changes. 2. Mild LEFT facial soft tissue swelling without acute fracture. 3. No static evidence of acute injury to the cervical spine. Moderate multilevel degenerative changes. Electronically Signed   By: Harmon PierJeffrey  Hu M.D.   On: 02/06/2018 15:52    Ct Maxillofacial Wo Contrast  Result Date: 02/06/2018 CLINICAL DATA:  84 year old female with head, neck and facial injuries from multiple falls. EXAM: CT HEAD WITHOUT CONTRAST CT MAXILLOFACIAL WITHOUT CONTRAST CT CERVICAL SPINE WITHOUT CONTRAST TECHNIQUE: Multidetector CT imaging of the head, cervical spine, and maxillofacial structures were performed using the standard protocol without intravenous contrast. Multiplanar CT image reconstructions of the cervical spine and maxillofacial structures were also generated. COMPARISON:  04/14/2017 head CT FINDINGS: CT HEAD FINDINGS Brain: No evidence of acute infarction, hemorrhage, hydrocephalus, extra-axial collection or mass lesion/mass effect. Mild chronic small-vessel white matter ischemic changes again noted. Vascular: No hyperdense vessel or unexpected calcification. Skull: Normal. Negative for fracture or focal lesion. Other: None. CT MAXILLOFACIAL FINDINGS Osseous: No fracture or mandibular dislocation. No destructive process. Orbits: No traumatic or inflammatory finding. Sinuses: A small amount of fluid in the LEFT sphenoid sinus noted. Soft tissues: Mild LEFT facial soft tissue swelling noted. CT CERVICAL SPINE FINDINGS Alignment: Normal. Skull base and vertebrae: No acute fracture. No primary bone lesion or focal pathologic process. Soft tissues and spinal canal: No prevertebral fluid or swelling. No visible canal hematoma. Disc levels: Moderate multilevel degenerative disc disease, spondylosis and facet arthropathy noted. Upper chest: No acute abnormality Other: None IMPRESSION: 1. No  evidence of acute intracranial abnormality. Mild chronic small-vessel white matter ischemic changes. 2. Mild LEFT facial soft tissue swelling without acute fracture. 3. No static evidence of acute injury to the cervical spine. Moderate multilevel degenerative changes. Electronically Signed   By: Harmon Pier M.D.   On: 02/06/2018 15:52     ____________________________________________   PROCEDURES  Procedure(s) performed:   Procedures  Critical Care performed:   ____________________________________________   INITIAL IMPRESSION / ASSESSMENT AND PLAN / ED COURSE   Patient is not tender over the rib or chest wall at all.  I reexamined her to confirm this.  She denies any burning when she urinates either.  However her urine smells strongly per the nurse and family members.  I will give her some Keflex 1 3 times a day for 5 days to treat any possible UTI as patient refuses to give [place] urine specimen I do not think it would be appropriate at this point to hold her down to get a cath specimen.        ____________________________________________   FINAL CLINICAL IMPRESSION(S) / ED DIAGNOSES  Final diagnoses:  Fall, initial encounter     ED Discharge Orders         Ordered    cephALEXin (KEFLEX) 500 MG capsule  3 times daily     02/06/18 1849           Note:  This document was prepared using Dragon voice recognition software and may include unintentional dictation errors.    Arnaldo Natal, MD 02/06/18 [phone]

## 2018-02-07 NOTE — Progress Notes (Signed)
As per the request of patient and family LCSW assisted them in completing Fl2 and obtaining a passr number they will bring this to their family doctor. Several resources were provided for both in home and SNF care options.  Delta Air LinesClaudine Jayton Popelka LCSW [phone]

## 2018-02-07 NOTE — NC FL2 (Signed)
  [place] MEDICAID FL2 LEVEL OF CARE SCREENING TOOL     IDENTIFICATION  Patient Name: [name] PeersWilma A Fairbairn Birthdate: 03/11/1929 Sex: female Admission Date (Current Location): 02/06/2018  [place]   Facility and Address:  [facility], [address], [place], [place] [address]      Provider Number: [id number]  Attending Physician Name and Address:  No att. providers found  Relative Name and Phone Number:   Onalee HuaDavid Osment [phone] son)    Current Level of Care: Hospital Recommended Level of Care: Memory Care, Family Care Home Prior Approval Number:    Date Approved/Denied:   PASRR Number:   [id number]  Discharge Plan: Home    Current Diagnoses: There are no active problems to display for this patient.   Orientation RESPIRATION BLADDER Height & Weight     Self, Situation  Normal Continent Weight: 150 lb (68 kg) Height:  5\' 5"  (165.1 cm)  BEHAVIORAL SYMPTOMS/MOOD NEUROLOGICAL BOWEL NUTRITION STATUS      Continent Diet(Normal)  AMBULATORY STATUS COMMUNICATION OF NEEDS Skin    Can walk Verbally Normal                       Personal Care Assistance Level of Assistance  Bathing, Feeding, Dressing, Total care Bathing Assistance: Independent Feeding assistance: Independent Dressing Assistance: Limited assistance Total Care Assistance: Independent   Functional Limitations Info  Sight, Hearing, Speech Sight Info: Adequate Hearing Info: Impaired(Hard of hearing) Speech Info: Adequate    SPECIAL CARE FACTORS FREQUENCY                       Contractures Contractures Info: Not present    Additional Factors Info  Code Status Code Status Info: Full code             Current Medications (02/07/2018):  This is the current hospital active medication list No current facility-administered medications for this encounter.    Current Outpatient Medications  Medication Sig Dispense Refill  . cephALEXin  (KEFLEX) 500 MG capsule Take 1 capsule (500 mg total) by mouth 3 (three) times daily for 10 days. 15 capsule 0     Discharge Medications: Please see discharge summary for a list of discharge medications.  Relevant Imaging Results:  Relevant Lab Results:   Additional Information [id number] SSN   Keriana Sarsfield, [place] M, [place]

## 2018-05-01 ENCOUNTER — Emergency Department
Admission: EM | Admit: 2018-05-01 | Discharge: 2018-05-01 | Disposition: A | Discharge status: Home / Self Care | Payer: Medicare Other | Attending: Emergency Medicine | Admitting: Emergency Medicine

## 2018-05-01 ENCOUNTER — Emergency Department: Payer: Medicare Other

## 2018-05-01 DIAGNOSIS — E876 Hypokalemia: Secondary | ICD-10-CM | POA: Insufficient documentation

## 2018-05-01 DIAGNOSIS — R410 Disorientation, unspecified: Secondary | ICD-10-CM | POA: Diagnosis present

## 2018-05-01 DIAGNOSIS — W010XXA Fall on same level from slipping, tripping and stumbling without subsequent striking against object, initial encounter: Secondary | ICD-10-CM | POA: Insufficient documentation

## 2018-05-01 DIAGNOSIS — W19XXXA Unspecified fall, initial encounter: Secondary | ICD-10-CM

## 2018-05-01 DIAGNOSIS — N3001 Acute cystitis with hematuria: Secondary | ICD-10-CM | POA: Insufficient documentation

## 2018-05-01 DIAGNOSIS — F0390 Unspecified dementia without behavioral disturbance: Secondary | ICD-10-CM | POA: Insufficient documentation

## 2018-05-01 DIAGNOSIS — I10 Essential (primary) hypertension: Secondary | ICD-10-CM | POA: Insufficient documentation

## 2018-05-01 LAB — URINALYSIS, COMPLETE (UACMP) WITH MICROSCOPIC
BILIRUBIN URINE: NEGATIVE
GLUCOSE, UA: NEGATIVE mg/dL
Ketones, ur: 5 mg/dL — AB
NITRITE: POSITIVE — AB
Protein, ur: 100 mg/dL — AB
SPECIFIC GRAVITY, URINE: 1.016 (ref 1.005–1.030)
pH: 6 (ref 5.0–8.0)

## 2018-05-01 LAB — BASIC METABOLIC PANEL
Anion gap: 10 (ref 5–15)
BUN: 15 mg/dL (ref 8–23)
CHLORIDE: 104 mmol/L (ref 98–111)
CO2: 27 mmol/L (ref 22–32)
Calcium: 9.3 mg/dL (ref 8.9–10.3)
Creatinine, Ser: 0.71 mg/dL (ref 0.44–1.00)
GFR calc non Af Amer: >60 mL/min (ref >60)
Glucose, Bld: 100 mg/dL — ABNORMAL HIGH (ref 70–99)
POTASSIUM: 3.1 mmol/L — AB (ref 3.5–5.1)
SODIUM: 141 mmol/L (ref 135–145)

## 2018-05-01 LAB — CBC
HEMATOCRIT: 42.6 % (ref 36.0–46.0)
HEMOGLOBIN: 14.1 g/dL (ref 12.0–15.0)
MCH: 29.3 pg (ref 26.0–34.0)
MCHC: 33.1 g/dL (ref 30.0–36.0)
MCV: 88.6 fL (ref 80.0–100.0)
Platelets: 172 10*3/uL (ref 150–400)
RBC: 4.81 MIL/uL (ref 3.87–5.11)
RDW: 13 % (ref 11.5–15.5)
WBC: 8 10*3/uL (ref 4.0–10.5)
nRBC: 0 % (ref 0.0–0.2)

## 2018-05-01 LAB — CK: Total CK: 150 U/L (ref 38–234)

## 2018-05-01 LAB — TROPONIN I: Troponin I: <0.03 ng/mL (ref <0.03)

## 2018-05-01 MED ORDER — CEPHALEXIN 500 MG PO CAPS: 500.0000 mg | ORAL_CAPSULE | Freq: Three times a day (TID) | ORAL | 0 refills | Status: AC

## 2018-05-01 MED ORDER — SODIUM CHLORIDE 0.9 % IV SOLN
1.0000 g | Freq: Once | INTRAVENOUS | Status: AC
  Administered 2018-05-01: 1 g via INTRAVENOUS
  Filled 2018-05-01: qty 10

## 2018-05-01 MED ORDER — POTASSIUM CHLORIDE 20 MEQ PO PACK
40.0000 meq | PACK | Freq: Once | ORAL | Status: AC
  Administered 2018-05-01: 40 meq via ORAL
  Filled 2018-05-01: qty 2

## 2018-05-01 NOTE — ED Notes (Signed)
Pt discharged with family taking her home

## 2018-05-01 NOTE — ED Provider Notes (Signed)
Patient [facility] by CM/SW [facility]. Will d/c with keflex for UTI   Jene EveryKinner, Donne Robillard, MD 05/01/18 1609

## 2018-05-01 NOTE — ED Notes (Signed)
Pt cleaned and repositioned in bed.

## 2018-05-01 NOTE — ED Notes (Signed)
sw in with pt

## 2018-05-01 NOTE — ED Triage Notes (Signed)
Pt presents via EMS. Per family last seen pt at 1400 yesterday and presented today to find pt in floor. Unknown time of fall. Pt soaked in urine. Hx advanced dementia. Unable to give history.

## 2018-05-01 NOTE — NC FL2 (Addendum)
  [place] MEDICAID FL2 LEVEL OF CARE SCREENING TOOL     IDENTIFICATION  Patient Name: [name] [name] Birthdate: 04/16/1929 Sex: female Admission Date (Current Location): 05/01/2018  [place]   Facility and Address:  [facility], [address], [place], [place] [address]      Provider Number: [id number]  Attending Physician Name and Address:  No att. providers found  Relative Name and Phone Number:   Annamary RummageJudy and David Gaba [phone]    Current Level of Care: Hospital Recommended Level of Care: Memory Care Prior Approval Number:    Date Approved/Denied:   PASRR Number:    [id number] A  Discharge Plan: Domiciliary (Rest home)    Current Diagnoses: There are no active problems to display for this patient.   Orientation RESPIRATION BLADDER Height & Weight     Self  Normal Incontinent Weight: 149 lb 14.6 oz (68 kg) Height:     BEHAVIORAL SYMPTOMS/MOOD NEUROLOGICAL BOWEL NUTRITION STATUS     Dementia Continent Normal diet  AMBULATORY STATUS COMMUNICATION OF NEEDS Skin   Independent Verbally Bruising                       Personal Care Assistance Level of Assistance  Bathing, Feeding, Dressing, Total care Bathing Assistance: Limited assistance Feeding assistance: Independent Dressing Assistance: Limited assistance Total Care Assistance: Limited assistance   Functional Limitations Info  Sight, Hearing, Speech Sight Info: Adequate Hearing Info: Adequate Speech Info: Adequate    SPECIAL CARE FACTORS FREQUENCY   Dementia                    Contractures Contractures Info: Not present    Additional Factors Info                  Current Medications (05/01/2018):  This is the current hospital active medication list No current facility-administered medications for this encounter.    Current Outpatient Medications  Medication Sig Dispense Refill  . cephALEXin (KEFLEX) 500 MG capsule Take 1  capsule (500 mg total) by mouth 3 (three) times daily for 7 days. 21 capsule 0     Discharge Medications: Please see discharge summary for a list of discharge medications.  Relevant Imaging Results:  Relevant Lab Results:   Additional Information [id number] SSN   Ailsa Mireles, [place] M, [place]

## 2018-05-01 NOTE — ED Notes (Signed)
SW at bedside.

## 2018-05-01 NOTE — ED Provider Notes (Signed)
[facility] Emergency Department Provider Note  ____________________________________________  Time seen: Approximately 1:47 PM  I have reviewed the triage vital signs and the nursing notes.   HISTORY  Chief Complaint Fall  Level 5 caveat:  Portions of the history and physical were unable to be obtained due to dementia   HPI [name] [name] is a 84 y.o. female with history of dementia and hypertension who presents for evaluation after fall vs syncope.  According to EMS family saw patient last yesterday at noon.  They came this afternoon to see her and she was found on the ground.  Patient has no recollection of fall.  She is confused which is her baseline.  Patient denies headache, neck pain, back pain, chest pain, extremity pain, abdominal pain.  Past Medical History:  Diagnosis Date  . Dementia   . Hypertension     There are no active problems to display for this patient.   No past surgical history on file.  Prior to Admission medications   Medication Sig Start Date End Date Taking? Authorizing Provider  cephALEXin (KEFLEX) 500 MG capsule Take 1 capsule (500 mg total) by mouth 3 (three) times daily for 7 days. 05/01/18 05/08/18  Nita Sickle, MD    Allergies Patient has no known allergies.  No family history on file.  Social History Social History   Tobacco Use  . Smoking status: Never Smoker  . Smokeless tobacco: Never Used  Substance Use Topics  . Alcohol use: Not on file  . Drug use: Not on file    Review of Systems Constitutional: Negative for fever. Eyes: Negative for visual changes. ENT: Negative for facial injury or neck injury Cardiovascular: Negative for chest injury. Respiratory: Negative for shortness of breath. Negative for chest wall injury. Gastrointestinal: Negative for abdominal pain or injury. Genitourinary: Negative for dysuria. Musculoskeletal: Negative for back injury, negative for arm or leg pain. Skin:  Negative for laceration/abrasions. Neurological: Negative for head injury.  ___________________________________________   PHYSICAL EXAM:  VITAL SIGNS: ED Triage Vitals  Enc Vitals Group     BP 05/01/18 1321 (!) 195/92     Pulse Rate 05/01/18 1321 74     Resp 05/01/18 1321 14     Temp 05/01/18 1321 98.5 F (36.9 C)     Temp Source 05/01/18 1321 Oral     SpO2 05/01/18 1321 98 %     Weight 05/01/18 1322 149 lb 14.6 oz (68 kg)     Height --      Head Circumference --      Peak Flow --      Pain Score 05/01/18 1322 0     Pain Loc --      Pain Edu? --      Excl. in GC? --     Constitutional: Alert and oriented x1. No acute distress. Does not appear intoxicated. Patient smells very strong of urine HEENT Head: Normocephalic and atraumatic. Face: No facial bony tenderness. Stable midface Ears: No hemotympanum bilaterally. No Battle sign Eyes: No eye injury. PERRL. No raccoon eyes Nose: Nontender. No epistaxis. No rhinorrhea Mouth/Throat: Mucous membranes are moist. No oropharyngeal blood. No dental injury. Airway patent without stridor. Normal voice. Neck: no C-collar in place. No midline c-spine tenderness.  Cardiovascular: Normal rate, regular rhythm. Normal and symmetric distal pulses are present in all extremities. Pulmonary/Chest: Chest wall is stable and nontender to palpation/compression. Normal respiratory effort. Breath sounds are normal. No crepitus.  Abdominal: Soft, nontender, non distended.  Musculoskeletal: Nontender with normal full range of motion in all extremities. No deformities. No thoracic or lumbar midline spinal tenderness. Pelvis is stable. Skin: Skin is warm, dry and intact. No abrasions or contutions. Psychiatric: Speech and behavior are appropriate. Neurological: Normal speech and language. Moves all extremities to command. No gross focal neurologic deficits are appreciated.  Glascow Coma Score: 4 - Opens eyes on own 6 - Follows simple motor commands 4  - Seems confused, disoriented GCS: 14   ____________________________________________   LABS (all labs ordered are listed, but only abnormal results are displayed)  Labs Reviewed  BASIC METABOLIC PANEL - Abnormal; Notable for the following components:      Result Value   Potassium 3.1 (*)    Glucose, Bld 100 (*)    All other components within normal limits  URINALYSIS, COMPLETE (UACMP) WITH MICROSCOPIC - Abnormal; Notable for the following components:   Color, Urine AMBER (*)    APPearance CLOUDY (*)    Hgb urine dipstick SMALL (*)    Ketones, ur 5 (*)    Protein, ur 100 (*)    Nitrite POSITIVE (*)    Leukocytes, UA SMALL (*)    Bacteria, UA RARE (*)    All other components within normal limits  URINE CULTURE  CBC  TROPONIN I  CK   ____________________________________________  EKG  ED ECG REPORT I, Nita Sickle, the attending physician, personally viewed and interpreted this ECG.  Normal sinus rhythm, rate of 73, normal intervals, normal axis, no ST elevations or depressions.  Normal EKG. ____________________________________________  RADIOLOGY  I have personally reviewed the images performed during this visit and I agree with the Radiologist's read.   Interpretation by Radiologist:  Ct Head Wo Contrast  Result Date: 05/01/2018 CLINICAL DATA:  Head trauma, found on floor today, last seen at 1400 hours yesterday, unknown time a fall, advanced dementia, hypertension EXAM: CT HEAD WITHOUT CONTRAST TECHNIQUE: Contiguous axial images were obtained from the base of the skull through the vertex without intravenous contrast. Sagittal and coronal MPR images reconstructed from axial data set. COMPARISON:  02/06/2018 FINDINGS: Brain: Generalized atrophy. Normal ventricular morphology. No midline shift or mass effect. Small vessel chronic ischemic changes of deep cerebral white matter. No intracranial hemorrhage, mass lesion, evidence of acute infarction, or extra-axial fluid  collection. Vascular: No hyperdense vessels. Skull: Intact Sinuses/Orbits: Clear Other: N/A IMPRESSION: Atrophy with small vessel chronic ischemic changes of deep cerebral white matter. No acute intracranial abnormalities. Electronically Signed   By: Ulyses Southward M.D.   On: 05/01/2018 14:34      ____________________________________________   PROCEDURES  Procedure(s) performed: None Procedures Critical Care performed:  None ____________________________________________   INITIAL IMPRESSION / ASSESSMENT AND PLAN / ED COURSE   84 y.o. female with history of dementia and hypertension who presents for evaluation after fall vs syncope.  Patient found down by her family today.  Last seen a little over 24 hours ago.  No obvious injuries on physical exam.  Patient denies any pain at this time.  Patient has advanced dementia and is currently at baseline.  CT head has been ordered to rule out intracranial injury.  Patient smells very strong of urine therefore we will send a UA to rule out UTI.  EKG shows no evidence of ischemia or dysrhythmias.  Will monitor on telemetry for any signs of dysrhythmias.  Will check labs to rule out AKI, electrolyte abnormalities, sepsis, or anemia.  Clinical Course as of May 01 1454  Sat May 01, 2018  1455 UA positive for UTI.  No signs of sepsis.  Patient was given Rocephin and will be discharged home on Keflex.  Head CT negative for intracranial injury.  Discussed with case manager and social work for evaluation for possible placement.   [CV]    Clinical Course User Index [CV] Don PerkingVeronese, [place], MD     As part of my medical decision making, I reviewed the following data within the electronic MEDICAL RECORD NUMBER History obtained from family, Nursing notes reviewed and incorporated, Labs reviewed , EKG interpreted , Old chart reviewed, Radiograph reviewed , A consult was requested and obtained from this/these consultant(s) CM and SW, Notes from prior ED visits and [place]  Controlled Substance Database    Pertinent labs & imaging results that were available during my care of the patient were reviewed by me and considered in my medical decision making (see chart for details).    ____________________________________________   FINAL CLINICAL IMPRESSION(S) / ED DIAGNOSES  Final diagnoses:  Fall, initial encounter  Acute cystitis with hematuria  Hypokalemia      NEW MEDICATIONS STARTED DURING THIS VISIT:  ED Discharge Orders         Ordered    cephALEXin (KEFLEX) 500 MG capsule  3 times daily     05/01/18 1418           Note:  This document was prepared using Dragon voice recognition software and may include unintentional dictation errors.    Nita SickleVeronese, [place], MD 05/01/18 1455

## 2018-05-01 NOTE — Care Management Note (Addendum)
Case Management Note  Patient Details  Name: [name] PeersWilma A [name] MRN: [id number] Date of Birth: 06/18/1928  Subjective/Objective:   Patient to be discharged per MD order. Orders in place for home health services. Patient  has had an increase in falls and confusion and does not understand her physical limitations due to dementia. Family concerned about the safety of her environment. CMS Medicare.gov Compare Post Acute Care list reviewed with patient and [facility] is their preference. Referral placed with Jermaine at [facility]. Family tells me patient is on a waiting list for assisted living but agreeable to home health in the meantime. No DME needed. Per family request will include social worker in the home health order to assist with transition of care. They're also requesting a list of private care sitters to assist with her care in the interim. We can provide this list. Plan is to discharge with home health services and out of pocket caregivers until the patient can get into a facility.                     Action/Plan:   Expected Discharge Date:                  Expected Discharge Plan:  Home w Home Health Services  In-House Referral:     Discharge planning Services  CM Consult  Post Acute Care Choice:  Home Health Choice offered to:  Patient, Adult Children  DME Arranged:    DME Agency:     HH Arranged:  RN, PT, Nurse's Aide, Social Work [facility] [employer] Agency:  [facility]  Status of Service:  Completed, signed off  If discussed at [employer] Meetings, dates discussed:    Additional Comments:  [name] ManifoldJosh A Errik Mitchelle, RN 05/01/2018, 4:03 PM

## 2018-05-01 NOTE — [place] FL2 (Deleted)
  [place] MEDICAID FL2 LEVEL OF CARE SCREENING TOOL     IDENTIFICATION  Patient Name: [name] [name] Birthdate: 03/01/1929 Sex: female Admission Date (Current Location): 05/01/2018  County and Medicaid Number:  [id number]   Facility and Address:  [facility], [address], [place], [place] [address]      Provider Number: [id number]  Attending Physician Name and Address:  No att. providers found  Relative Name and Phone Number:       Current Level of Care: Hospital Recommended Level of Care: Memory Care Prior Approval Number:    Date Approved/Denied:   PASRR Number:    Discharge Plan: Domiciliary (Rest home)    Current Diagnoses: There are no active problems to display for this patient.   Orientation RESPIRATION BLADDER Height & Weight     Self  Normal Incontinent Weight: 149 lb 14.6 oz (68 kg) Height:     BEHAVIORAL SYMPTOMS/MOOD NEUROLOGICAL BOWEL NUTRITION STATUS      Continent Diet  AMBULATORY STATUS COMMUNICATION OF NEEDS Skin   Independent Verbally Bruising                       Personal Care Assistance Level of Assistance  Bathing, Feeding, Dressing, Total care Bathing Assistance: Limited assistance Feeding assistance: Independent Dressing Assistance: Limited assistance Total Care Assistance: Limited assistance   Functional Limitations Info  Sight, Hearing, Speech Sight Info: Adequate Hearing Info: Adequate Speech Info: Adequate    SPECIAL CARE FACTORS FREQUENCY                       Contractures Contractures Info: Not present    Additional Factors Info                  Current Medications (05/01/2018):  This is the current hospital active medication list No current facility-administered medications for this encounter.    Current Outpatient Medications  Medication Sig Dispense Refill  . cephALEXin (KEFLEX) 500 MG capsule Take 1 capsule (500 mg total) by mouth 3 (three) times daily for 7 days. 21  capsule 0     Discharge Medications: Please see discharge summary for a list of discharge medications.  Relevant Imaging Results:  Relevant Lab Results:   Additional Information [id number] SSN   [name] [name] M, LCSW    

## 2018-05-01 NOTE — [place] FL2 (Deleted)
  [place] MEDICAID FL2 LEVEL OF CARE SCREENING TOOL     IDENTIFICATION  Patient Name: [name] [name] Birthdate: 01/02/1929 Sex: female Admission Date (Current Location): 05/01/2018  [facility] [place]   Facility and Address:  [facility], [address], [place], [place] [address]      Provider Number: [id number]  Attending Physician Name and Address:  No att. providers found  Relative Name and Phone Number:       Current Level of Care: Hospital Recommended Level of Care: Memory Care Prior Approval Number:    Date Approved/Denied:   PASRR Number:    Discharge Plan: Domiciliary (Rest home)    Current Diagnoses: There are no active problems to display for this patient.   Orientation RESPIRATION BLADDER Height & Weight     Self  Normal Incontinent Weight: 149 lb 14.6 oz (68 kg) Height:     BEHAVIORAL SYMPTOMS/MOOD NEUROLOGICAL BOWEL NUTRITION STATUS      Continent Diet  AMBULATORY STATUS COMMUNICATION OF NEEDS Skin   Independent Verbally Bruising                       Personal Care Assistance Level of Assistance  Bathing, Feeding, Dressing, Total care Bathing Assistance: Limited assistance Feeding assistance: Independent Dressing Assistance: Limited assistance Total Care Assistance: Limited assistance   Functional Limitations Info  Sight, Hearing, Speech Sight Info: Adequate Hearing Info: Adequate Speech Info: Adequate    SPECIAL CARE FACTORS FREQUENCY                       Contractures Contractures Info: Not present    Additional Factors Info                  Current Medications (05/01/2018):  This is the current hospital active medication list No current facility-administered medications for this encounter.    Current Outpatient Medications  Medication Sig Dispense Refill  . cephALEXin (KEFLEX) 500 MG capsule Take 1 capsule (500 mg total) by mouth 3 (three) times daily for 7 days. 21  capsule 0     Discharge Medications: Please see discharge summary for a list of discharge medications.  Relevant Imaging Results:  Relevant Lab Results:   Additional Information [id number] SSN   Davidlee Jeanbaptiste, [place] M, [place]

## 2018-05-03 LAB — URINE CULTURE: Culture: >=100000 — AB

## 2018-08-20 ENCOUNTER — Other Ambulatory Visit: Payer: Self-pay

## 2018-08-20 ENCOUNTER — Emergency Department
Admission: EM | Admit: 2018-08-20 | Discharge: 2018-08-20 | Disposition: A | Discharge status: Home / Self Care | Payer: Medicare Other | Attending: Emergency Medicine | Admitting: Emergency Medicine

## 2018-08-20 ENCOUNTER — Emergency Department: Payer: Medicare Other

## 2018-08-20 DIAGNOSIS — Y92122 Bedroom in nursing home as the place of occurrence of the external cause: Secondary | ICD-10-CM | POA: Diagnosis not present

## 2018-08-20 DIAGNOSIS — Y999 Unspecified external cause status: Secondary | ICD-10-CM | POA: Insufficient documentation

## 2018-08-20 DIAGNOSIS — S0083XA Contusion of other part of head, initial encounter: Secondary | ICD-10-CM | POA: Diagnosis not present

## 2018-08-20 DIAGNOSIS — Y9389 Activity, other specified: Secondary | ICD-10-CM | POA: Diagnosis not present

## 2018-08-20 DIAGNOSIS — I10 Essential (primary) hypertension: Secondary | ICD-10-CM | POA: Diagnosis not present

## 2018-08-20 DIAGNOSIS — S0990XA Unspecified injury of head, initial encounter: Secondary | ICD-10-CM | POA: Diagnosis not present

## 2018-08-20 DIAGNOSIS — W06XXXA Fall from bed, initial encounter: Secondary | ICD-10-CM | POA: Diagnosis not present

## 2018-08-20 DIAGNOSIS — F0390 Unspecified dementia without behavioral disturbance: Secondary | ICD-10-CM | POA: Diagnosis not present

## 2018-08-20 NOTE — ED Provider Notes (Signed)
[facility] Emergency Department Provider Note    First MD Initiated Contact with Patient 08/20/18 [phone]     (approximate)  I have reviewed the triage vital signs and the nursing notes.   HISTORY  Chief Complaint Fall    HPI [name] [name] is a 85 y.o. female with medical history as listed below history of dementia and hypertension presents to the emergency department via EMS from [facility] with history of rolling out of bed tonight with resultant head and facial injury.  Patient denies any complaints at present.     Past Medical History:  Diagnosis Date  . Dementia (HCC)   . Hypertension     There are no active problems to display for this patient.   History reviewed. No pertinent surgical history.  Prior to Admission medications   Not on File    Allergies Patient has no known allergies.  History reviewed. No pertinent family history.  Social History Social History   Tobacco Use  . Smoking status: Never Smoker  . Smokeless tobacco: Never Used  Substance Use Topics  . Alcohol use: Not on file  . Drug use: Not on file    Review of Systems Constitutional: No fever/chills Eyes: No visual changes. ENT: No sore throat. Cardiovascular: Denies chest pain. Respiratory: Denies shortness of breath. Gastrointestinal: No abdominal pain.  No nausea, no vomiting.  No diarrhea.  No constipation. Genitourinary: Negative for dysuria. Musculoskeletal: Negative for neck pain.  Negative for back pain. Integumentary: Facial contusion on nasal bone. Neurological: Negative for headaches, focal weakness or numbness.   ____________________________________________   PHYSICAL EXAM:  VITAL SIGNS: ED Triage Vitals  Enc Vitals Group     BP 08/20/18 0355 (!) 162/78     Pulse Rate 08/20/18 0354 77     Resp 08/20/18 0354 18     Temp 08/20/18 0354 98.7 F (37.1 C)     Temp Source 08/20/18 0354 Oral     SpO2 08/20/18 0347 98  %     Weight 08/20/18 0355 68 kg (150 lb)     Height 08/20/18 0355 1.702 m ( )     Head Circumference --      Peak Flow --      Pain Score 08/20/18 0355 0     Pain Loc --      Pain Edu? --      Excl. in GC? --     Constitutional: Alert and . Well appearing and in no acute distress. Eyes: Conjunctivae are normal. PERRL. EOMI. Head: Contusion/ecchymosis nasal bridge Ears:  Healthy appearing ear canals and TMs bilaterally Nose: Contusion ecchymosis on the nasal bridge Mouth/Throat: Mucous membranes are moist. Oropharynx non-erythematous. Neck: No stridor.   Cardiovascular: Normal rate, regular rhythm. Good peripheral circulation. Grossly normal heart sounds. Respiratory: Normal respiratory effort.  No retractions. Lungs CTAB. Gastrointestinal: Soft and nontender. No distention.  Musculoskeletal: No lower extremity tenderness nor edema. No gross deformities of extremities. Neurologic:  Normal speech and language. No gross focal neurologic deficits are appreciated.  Skin:  Skin is warm, dry and intact. No rash noted.   ____________________________________________    RADIOLOGY I, Darci Current, personally viewed and evaluated these images (plain radiographs) as part of my medical decision making, as well as reviewing the written report by the radiologist.  ED MD interpretation: No evidence of acute intracranial or cervical spine injury on CT.  No facial fractures noted.  Official radiology report(s): Ct Head Wo  Contrast  Result Date: 08/20/2018 CLINICAL DATA:  Fall with head injury.  Dementia EXAM: CT HEAD WITHOUT CONTRAST CT MAXILLOFACIAL WITHOUT CONTRAST CT CERVICAL SPINE WITHOUT CONTRAST TECHNIQUE: Multidetector CT imaging of the head, cervical spine, and maxillofacial structures were performed using the standard protocol without intravenous contrast. Multiplanar CT image reconstructions of the cervical spine and maxillofacial structures were also generated. COMPARISON:   05/01/2018 head CT FINDINGS: CT HEAD FINDINGS Brain: No evidence of acute infarction, hemorrhage, hydrocephalus, extra-axial collection or mass lesion/mass effect. Moderate area of encephalomalacia in the inferior right temporal to occipital lobe which could be ischemic or posttraumatic. Atrophy and mild chronic small vessel ischemia for age. Vascular: No hyperdense vessel or unexpected calcification. Skull: Negative for fracture CT MAXILLOFACIAL FINDINGS Osseous: Motion artifact, especially affecting the mandible. No acute fracture. Orbits: No evidence of injury. Bilateral cataract resection and AP elongation of the globes Sinuses: Negative for hemosinus. There is prominent leftward nasal septal deviation. Frothy material in the nasopharynx and left nasal cavity which may be epistaxis. Soft tissues: Possible soft tissue swelling over the nasal bridge. CT CERVICAL SPINE FINDINGS Alignment: No traumatic malalignment. Mild, degenerative, fused anterolisthesis at C4-5. Skull base and vertebrae: Negative for acute fracture Soft tissues and spinal canal: No prevertebral fluid or swelling. No visible canal hematoma. Incidental left thyroid nodule. Disc levels: Generalized disc narrowing and facet spurring. Ankylosis of the most hypertrophic facet on the left at C4-5. Upper chest: No acute finding IMPRESSION: 1. No evidence of acute intracranial or cervical spine injury. 2. Motion degraded face CT that is negative for fracture. Electronically Signed   By: Marnee Spring M.D.   On: 08/20/2018 04:50   Ct Cervical Spine Wo Contrast  Result Date: 08/20/2018 CLINICAL DATA:  Fall with head injury.  Dementia EXAM: CT HEAD WITHOUT CONTRAST CT MAXILLOFACIAL WITHOUT CONTRAST CT CERVICAL SPINE WITHOUT CONTRAST TECHNIQUE: Multidetector CT imaging of the head, cervical spine, and maxillofacial structures were performed using the standard protocol without intravenous contrast. Multiplanar CT image reconstructions of the cervical  spine and maxillofacial structures were also generated. COMPARISON:  05/01/2018 head CT FINDINGS: CT HEAD FINDINGS Brain: No evidence of acute infarction, hemorrhage, hydrocephalus, extra-axial collection or mass lesion/mass effect. Moderate area of encephalomalacia in the inferior right temporal to occipital lobe which could be ischemic or posttraumatic. Atrophy and mild chronic small vessel ischemia for age. Vascular: No hyperdense vessel or unexpected calcification. Skull: Negative for fracture CT MAXILLOFACIAL FINDINGS Osseous: Motion artifact, especially affecting the mandible. No acute fracture. Orbits: No evidence of injury. Bilateral cataract resection and AP elongation of the globes Sinuses: Negative for hemosinus. There is prominent leftward nasal septal deviation. Frothy material in the nasopharynx and left nasal cavity which may be epistaxis. Soft tissues: Possible soft tissue swelling over the nasal bridge. CT CERVICAL SPINE FINDINGS Alignment: No traumatic malalignment. Mild, degenerative, fused anterolisthesis at C4-5. Skull base and vertebrae: Negative for acute fracture Soft tissues and spinal canal: No prevertebral fluid or swelling. No visible canal hematoma. Incidental left thyroid nodule. Disc levels: Generalized disc narrowing and facet spurring. Ankylosis of the most hypertrophic facet on the left at C4-5. Upper chest: No acute finding IMPRESSION: 1. No evidence of acute intracranial or cervical spine injury. 2. Motion degraded face CT that is negative for fracture. Electronically Signed   By: Marnee Spring M.D.   On: 08/20/2018 04:50   Ct Maxillofacial Wo Contrast  Result Date: 08/20/2018 CLINICAL DATA:  Fall with head injury.  Dementia EXAM: CT HEAD WITHOUT CONTRAST  CT MAXILLOFACIAL WITHOUT CONTRAST CT CERVICAL SPINE WITHOUT CONTRAST TECHNIQUE: Multidetector CT imaging of the head, cervical spine, and maxillofacial structures were performed using the standard protocol without  intravenous contrast. Multiplanar CT image reconstructions of the cervical spine and maxillofacial structures were also generated. COMPARISON:  05/01/2018 head CT FINDINGS: CT HEAD FINDINGS Brain: No evidence of acute infarction, hemorrhage, hydrocephalus, extra-axial collection or mass lesion/mass effect. Moderate area of encephalomalacia in the inferior right temporal to occipital lobe which could be ischemic or posttraumatic. Atrophy and mild chronic small vessel ischemia for age. Vascular: No hyperdense vessel or unexpected calcification. Skull: Negative for fracture CT MAXILLOFACIAL FINDINGS Osseous: Motion artifact, especially affecting the mandible. No acute fracture. Orbits: No evidence of injury. Bilateral cataract resection and AP elongation of the globes Sinuses: Negative for hemosinus. There is prominent leftward nasal septal deviation. Frothy material in the nasopharynx and left nasal cavity which may be epistaxis. Soft tissues: Possible soft tissue swelling over the nasal bridge. CT CERVICAL SPINE FINDINGS Alignment: No traumatic malalignment. Mild, degenerative, fused anterolisthesis at C4-5. Skull base and vertebrae: Negative for acute fracture Soft tissues and spinal canal: No prevertebral fluid or swelling. No visible canal hematoma. Incidental left thyroid nodule. Disc levels: Generalized disc narrowing and facet spurring. Ankylosis of the most hypertrophic facet on the left at C4-5. Upper chest: No acute finding IMPRESSION: 1. No evidence of acute intracranial or cervical spine injury. 2. Motion degraded face CT that is negative for fracture. Electronically Signed   By: Marnee Spring M.D.   On: 08/20/2018 04:50    ____________________________________________     Procedures   ____________________________________________   INITIAL IMPRESSION / MDM / ASSESSMENT AND PLAN / ED COURSE  As part of my medical decision making, I reviewed the following data within the electronic medical  record:    CIELITA NOVACK was evaluated in Emergency Department on 08/20/2018 for the symptoms described in the history of present illness. She was evaluated in the context of the global COVID-19 pandemic, which necessitated consideration that the patient might be at risk for infection with the SARS-CoV-2 virus that causes COVID-19. Institutional protocols and algorithms that pertain to the evaluation of patients at risk for COVID-19 are in a state of rapid change based on information released by regulatory bodies including the CDC and federal and state organizations. These policies and algorithms were followed during the patient's care in the ED.     85 year old female presenting with above-stated history and physical exam secondary to out of bed with facial contusion.  CT head cervical spine and maxillofacial revealed no acute pathology.  ____________________________________________  FINAL CLINICAL IMPRESSION(S) / ED DIAGNOSES  Final diagnoses:  Contusion of face, initial encounter     MEDICATIONS GIVEN DURING THIS VISIT:  Medications - No data to display   ED Discharge Orders    None       Note:  This document was prepared using Dragon voice recognition software and may include unintentional dictation errors.   Darci Current, MD 08/20/18 [phone]

## 2018-08-20 NOTE — ED Triage Notes (Signed)
Patient arrived from [facility] by [facility]. Patient has facial trama from fall out of bed. Per EMS Patient rolled out of bed and fell and hit face. Patient has dried blood on face and nose area. Patient has old skin tear on right arm that is covered, but bandage is bloody. New skin tear on left arm. EMS covered with bandage. Patient has history of falls.

## 2018-08-20 NOTE — ED Notes (Signed)
Patient returned from CT

## 2018-08-20 NOTE — ED Notes (Signed)
[facility] Notified of patient to return to Assisted living facility.

## 2018-08-20 NOTE — ED Notes (Signed)
Report from [place], [place]. Pt is awaiting transport back to facility via EMS. Pt is unable to sign due to dementia. Pt is resting in bed at this time, pt is in NAD.

## 2018-08-20 NOTE — ED Notes (Signed)
Notified patient's family Dorothymae Parchman Notified of patient in ER and is being discharge back to [facility].

## 2018-08-20 NOTE — ED Notes (Signed)
Patient transferred to CT

## 2019-03-01 ENCOUNTER — Ambulatory Visit: Payer: Medicare Other | Admitting: Physician Assistant

## 2019-03-03 ENCOUNTER — Encounter: Payer: Medicare Other | Attending: Physician Assistant | Admitting: Physician Assistant

## 2019-03-03 ENCOUNTER — Other Ambulatory Visit: Payer: Self-pay

## 2019-03-03 DIAGNOSIS — L89513 Pressure ulcer of right ankle, stage 3: Secondary | ICD-10-CM | POA: Diagnosis not present

## 2019-03-03 DIAGNOSIS — F0150 Vascular dementia without behavioral disturbance: Secondary | ICD-10-CM | POA: Insufficient documentation

## 2019-03-03 DIAGNOSIS — L89224 Pressure ulcer of left hip, stage 4: Secondary | ICD-10-CM | POA: Diagnosis not present

## 2019-03-03 DIAGNOSIS — L89523 Pressure ulcer of left ankle, stage 3: Secondary | ICD-10-CM | POA: Diagnosis not present

## 2019-03-03 DIAGNOSIS — I252 Old myocardial infarction: Secondary | ICD-10-CM | POA: Diagnosis not present

## 2019-03-03 DIAGNOSIS — L89623 Pressure ulcer of left heel, stage 3: Secondary | ICD-10-CM | POA: Insufficient documentation

## 2019-03-03 DIAGNOSIS — H409 Unspecified glaucoma: Secondary | ICD-10-CM | POA: Insufficient documentation

## 2019-03-03 DIAGNOSIS — I10 Essential (primary) hypertension: Secondary | ICD-10-CM | POA: Insufficient documentation

## 2019-03-03 DIAGNOSIS — M1990 Unspecified osteoarthritis, unspecified site: Secondary | ICD-10-CM | POA: Insufficient documentation

## 2019-03-03 NOTE — Progress Notes (Signed)
[name] [name], [name] [name] ([id number]) Visit Report for 03/03/2019 Abuse/Suicide Risk Screen Details Patient Name: [name] [name], [name] Date of Service: 03/03/2019 9:45 AM Medical Record Number: [id number] Patient Account Number: [id number] Date of Birth/Sex: [birth year] (85 y.o. F) Treating RN: Harold Barban Primary Care Celicia Minahan: Benita Stabile Other Clinician: Referring Bethanee Redondo: Benita Stabile Treating Toria Monte/Extender: STONE III, [name] Weeks in Treatment: 0 Abuse/Suicide Risk Screen Items Answer ABUSE RISK SCREEN: Has anyone close to you tried to hurt or harm you recentlyo No Do you feel uncomfortable with anyone in your familyo No Has anyone forced you do things that you didnot want to doo No Electronic Signature(s) Signed: 03/03/2019 4:37:29 PM By: Harold Barban Entered By: Harold Barban on 03/03/2019 09:53:30 [name] [name], [name] ([id number]) -------------------------------------------------------------------------------- Activities of Daily Living Details Patient Name: [name], [name] Date of Service: 03/03/2019 9:45 AM Medical Record Number: [id number] Patient Account Number: [id number] Date of Birth/Sex: [birth year] (85 y.o. F) Treating RN: Harold Barban Primary Care Ariann Khaimov: Benita Stabile Other Clinician: Referring Nica Friske: Benita Stabile Treating Kaveri Perras/Extender: STONE III, [name] Weeks in Treatment: 0 Activities of Daily Living Items Answer Activities of Daily Living (Please select one for each item) Drive Automobile Not Able Take Medications Not Able Use Telephone Not Able Care for Appearance Not Able Use Toilet Not Able Bath / Shower Not Able Dress Self Not Able Feed Self Need Assistance Walk Not Able Get In / Out Bed Not Able Housework Not Able Prepare Meals Not Able Handle Money Not Able Shop for Self Not Able Electronic Signature(s) Signed: 03/03/2019 4:37:29 PM By: Harold Barban Entered By: Harold Barban on 03/03/2019 09:54:29 [name] [name]  ([id number]) -------------------------------------------------------------------------------- Education Screening Details Patient Name: [name] Roan A. Date of Service: 03/03/2019 9:45 AM Medical Record Number: [id number] Patient Account Number: [id number] Date of Birth/Sex: 1929/03/18 (85 y.o. F) Treating RN: Harold Barban Primary Care Yuliya Nova: Benita Stabile Other Clinician: Referring Unique Searfoss: Benita Stabile Treating Sammi Stolarz/Extender: Melburn Hake, [name] Weeks in Treatment: 0 Primary Learner Assessed: Caregiver Learning Preferences/Education Level/Primary Language Learning Preference: Explanation Highest Education Level: College or Above Preferred Language: English Cognitive Barrier Language Barrier: No Translator Needed: No Memory Deficit: No Emotional Barrier: No Cultural/Religious Beliefs Affecting Medical Care: No Physical Barrier Impaired Vision: No Impaired Hearing: No Decreased Hand dexterity: No Knowledge/Comprehension Knowledge Level: High Comprehension Level: High Ability to understand written High instructions: Ability to understand verbal High instructions: Motivation Anxiety Level: Calm Cooperation: Cooperative Education Importance: Acknowledges Need Interest in Health Problems: Asks Questions Perception: Coherent Willingness to Engage in Self- High Management Activities: Readiness to Engage in Self- High Management Activities: Electronic Signature(s) Signed: 03/03/2019 4:37:29 PM By: Harold Barban Entered By: Harold Barban on 03/03/2019 09:54:52 [name] [name], [name] [name] ([id number]) -------------------------------------------------------------------------------- Fall Risk Assessment Details Patient Name: [name] Roan A. Date of Service: 03/03/2019 9:45 AM Medical Record Number: [id number] Patient Account Number: [id number] Date of Birth/Sex: [birth year] (85 y.o. F) Treating RN: Harold Barban Primary Care Wren Gallaga: Benita Stabile Other Clinician: Referring  Khristian Seals: Benita Stabile Treating Darneisha Windhorst/Extender: Melburn Hake, [name] Weeks in Treatment: 0 Fall Risk Assessment Items Have you had 2 or more falls in the last 12 monthso 0 Yes Have you had any fall that resulted in injury in the last 12 monthso 0 No FALLS RISK SCREEN History of falling - immediate or within 3 months 0 No Secondary diagnosis (Do you have 2 or more medical diagnoseso) 0 No Ambulatory aid None/bed rest/wheelchair/nurse 0 No Crutches/cane/walker 0 No Furniture 0 No Intravenous therapy Access/Saline/Heparin Lock 0 No Gait/Transferring Normal/ bed rest/ wheelchair 0 No Weak (  short steps with or without shuffle, stooped but able to lift head while 0 No walking, may seek support from furniture) Impaired (short steps with shuffle, may have difficulty arising from chair, head 20 Yes down, impaired balance) Mental Status Oriented to own ability 0 No Electronic Signature(s) Signed: 03/03/2019 4:37:29 PM By: Arnette Norris Entered By: Arnette Norris on 03/03/2019 09:55:23 [name] [name], [name] ([id number]) -------------------------------------------------------------------------------- Foot Assessment Details Patient Name: [name] Lye A. Date of Service: 03/03/2019 9:45 AM Medical Record Number: [id number] Patient Account Number: [id number] Date of Birth/Sex: October 31, 1928 (85 y.o. F) Treating RN: Arnette Norris Primary Care Michalene Debruler: Dewaine Oats Other Clinician: Referring Deane Wattenbarger: Dewaine Oats Treating Lamarcus Spira/Extender: STONE III, [name] Weeks in Treatment: 0 Foot Assessment Items Site Locations + = Sensation present, - = Sensation absent, C = Callus, U = Ulcer R = Redness, W = Warmth, M = Maceration, PU = Pre-ulcerative lesion F = Fissure, S = Swelling, D = Dryness Assessment Right: Left: Other Deformity: No No Prior Foot Ulcer: No No Prior Amputation: No No Charcot Joint: No No Ambulatory Status: Non-ambulatory Assistance Device: Wheelchair Gait: Unsteady Notes Patient  has dementia Electronic Signature(s) Signed: 03/03/2019 4:37:29 PM By: Arnette Norris Entered By: Arnette Norris on 03/03/2019 10:07:45 [name] [name] ([id number]) -------------------------------------------------------------------------------- Nutrition Risk Screening Details Patient Name: [name] Lye A. Date of Service: 03/03/2019 9:45 AM Medical Record Number: [id number] Patient Account Number: [id number] Date of Birth/Sex: May 16, 1929 (85 y.o. F) Treating RN: Arnette Norris Primary Care Ileana Chalupa: Dewaine Oats Other Clinician: Referring Mariaclara Spear: TATE, Katherina Right Treating Justine Cossin/Extender: STONE III, [name] Weeks in Treatment: 0 Height (in): 67 Weight (lbs): 140 Body Mass Index (BMI): 21.9 Nutrition Risk Screening Items Score Screening NUTRITION RISK SCREEN: I have an illness or condition that made me change the kind and/or amount of 0 No food I eat I eat fewer than two meals per day 0 No I eat few fruits and vegetables, or milk products 2 Yes I have three or more drinks of beer, liquor or wine almost every day 0 No I have tooth or mouth problems that make it hard for me to eat 0 No I don't always have enough money to buy the food I need 0 No I eat alone most of the time 0 No I take three or more different prescribed or over-the-counter drugs a day 1 Yes Without wanting to, I have lost or gained 10 pounds in the last six months 0 No I am not always physically able to shop, cook and/or feed myself 0 No Nutrition Protocols Good Risk Protocol Moderate Risk Protocol High Risk Proctocol Risk Level: Moderate Risk Score: 3 Electronic Signature(s) Signed: 03/03/2019 4:37:29 PM By: Arnette Norris Entered By: Arnette Norris on 03/03/2019 10:03:43

## 2019-03-04 NOTE — Progress Notes (Signed)
[name] [name], [name] [name] ([id number]) Visit Report for 03/03/2019 Chief Complaint Document Details Patient Name: [name] [name], [name] [name]. Date of Service: 03/03/2019 9:45 AM Medical Record Number: [id number] Patient Account Number: [id number] Date of Birth/Sex: Feb 18, 1929 (85 y.o. F) Treating RN: Huel Coventry Primary Care Provider: Dewaine Oats Other Clinician: Referring Provider: Dewaine Oats Treating Provider/Extender: Linwood Dibbles, HOYT Weeks in Treatment: 0 Information Obtained from: Patient Chief Complaint Multiple pressure ulcers Electronic Signature(s) Signed: 03/03/2019 10:25:03 AM By: Lenda Kelp PA-C Entered By: Lenda Kelp on 03/03/2019 10:25:03 [name] [name]. ([id number]) -------------------------------------------------------------------------------- Debridement Details Patient Name: [name] [name]. Date of Service: 03/03/2019 9:45 AM Medical Record Number: [id number] Patient Account Number: [id number] Date of Birth/Sex: 03/21/1929 (85 y.o. F) Treating RN: Huel Coventry Primary Care Provider: Dewaine Oats Other Clinician: Referring Provider: Dewaine Oats Treating Provider/Extender: Linwood Dibbles, HOYT Weeks in Treatment: 0 Debridement Performed for Wound #3 Left Ischium Assessment: Performed By: Physician STONE III, HOYT E., PA-C Debridement Type: Debridement Level of Consciousness (Pre- Awake and Alert procedure): Pre-procedure Verification/Time Yes - 10:35 Out Taken: Start Time: 10:35 Pain Control: Lidocaine Total Area Debrided (L x W): 3.5 (cm) x 4.2 (cm) = 14.7 (cm) Tissue and other material Viable, Non-Viable, Fat, Muscle, Slough, Subcutaneous, Slough debrided: Level: Skin/Subcutaneous Tissue/Muscle Debridement Description: Excisional Instrument: Forceps, Scissors Bleeding: Minimum Hemostasis Achieved: Pressure End Time: 10:40 Response to Treatment: Procedure was tolerated well Level of Consciousness Awake and Alert (Post-procedure): Post Debridement Measurements of Total  Wound Length: (cm) 3.5 Stage: Category/Stage IV Width: (cm) 4.2 Depth: (cm) 2.2 Volume: (cm) 25.4 Character of Wound/Ulcer Post Stable Debridement: Post Procedure Diagnosis Same as Pre-procedure Electronic Signature(s) Signed: 03/03/2019 5:55:45 PM By: Elliot Gurney, BSN, RN, CWS, Kim RN, BSN Signed: 03/03/2019 6:39:08 PM By: Lenda Kelp PA-C Entered By: Elliot Gurney, BSN, RN, CWS, Kim on 03/03/2019 10:42:16 [name] [name]. ([id number]) -------------------------------------------------------------------------------- HPI Details Patient Name: [name] [name]. Date of Service: 03/03/2019 9:45 AM Medical Record Number: [id number] Patient Account Number: [id number] Date of Birth/Sex: May 22, 1928 (85 y.o. F) Treating RN: Huel Coventry Primary Care Provider: Dewaine Oats Other Clinician: Referring Provider: Dewaine Oats Treating Provider/Extender: Linwood Dibbles, HOYT Weeks in Treatment: 0 History of Present Illness HPI Description: 03/03/2019 on evaluation today patient presents for initial evaluation here in the clinic today concerning issues that she has been having with wounds which are pressure in nature at multiple locations currently. This is on her left heel, right ankle, left hip/ischial location, and all her to varying degrees of severity and depth. The worst is on the left ischial location. At this time the patient has been tolerating dressing changes although I am not exactly sure the dressings that have been utilized prior to coming in today. She does have some necrotic tissue noted at several locations this can require at least some degree of cleaning prior to application of dressings going forward. The patient does have dementia and unfortunately in recent months has become increasingly immobile requiring more significant treatment she was just recently moved roughly 2 weeks ago from assisted living to [name] skilled nursing facility at [facility]. Based on what I am seeing currently these pressure  injuries occurred prior to that 2-week time I do not see any new or obvious pressure injury at this point. I discussed with the patient as well as her family member that this does appear to be doing better in my opinion than likely where things started. They are unsure as to whether or not she has an air mattress at the facility I  do think that would be something that would be appropriate and good for her to have but at the moment I do not know for sure whether that is already in place if not that definitely is can be 1 of the recommendations. Also think [name] Roho cushion for her wheelchair would be good as well as Prevalon offloading boots. The patient currently does not have any severe pain although when I was cleaning some of the areas she did note there was some discomfort at several of the locations. She does have [name] history of hypertension as well. Electronic Signature(s) Signed: 03/03/2019 1:41:23 PM By: Worthy Keeler PA-C Entered By: Worthy Keeler on 03/03/2019 13:41:22 [name] [name] ([id number]) -------------------------------------------------------------------------------- Physical Exam Details Patient Name: [name] [name]. Date of Service: 03/03/2019 9:45 AM Medical Record Number: [id number] Patient Account Number: [id number] Date of Birth/Sex: [birth year] (85 y.o. F) Treating RN: Cornell Barman Primary Care Provider: Benita Stabile Other Clinician: Referring Provider: Benita Stabile Treating Provider/Extender: STONE III, HOYT Weeks in Treatment: 0 Constitutional sitting or standing blood pressure is within target range for patient.. pulse regular and within target range for patient.Marland Kitchen respirations regular, non-labored and within target range for patient.Marland Kitchen temperature within target range for patient.. Well- nourished and well-hydrated in no acute distress. Eyes conjunctiva clear no eyelid edema noted. pupils equal round and reactive to light and accommodation. Ears, Nose, Mouth, and  Throat no gross abnormality of ear auricles or external auditory canals. normal hearing noted during conversation. mucus membranes moist. Respiratory normal breathing without difficulty. clear to auscultation bilaterally. Cardiovascular regular rate and rhythm with normal S1, S2. 1+ dorsalis pedis/posterior tibialis pulses. no clubbing, cyanosis, significant edema, <3 sec cap refill. Gastrointestinal (GI) soft, non-tender, non-distended, +BS. no ventral hernia noted. Musculoskeletal Patient unable to walk without assistance. no significant deformity or arthritic changes, no loss or range of motion, no clubbing. Psychiatric Patient is not able to cooperate in decision making regarding care. Patient is oriented to person only. pleasant and cooperative. Notes Upon evaluation today patient's wounds on her heel and her ankle of lower extremities only required manual/mechanical debridement there was no sharp debridement necessary I was able to remove the slough and overall she seems to be doing quite well in that regard. I do think is still that it takes some time and I do believe she needs to be very careful as far as appropriate offloading is concerned and all that will be documented in her plan as well. In my opinion she seems to have fairly good blood flow into her lower extremities based on what I am seeing she did have some bleeding even after mechanical debridement. In regard to her sacral region this appears to be doing quite well and though there is some subcutaneous layer noted there did not appear to be any evidence of deeper wounds this is [name] stage III but nonetheless I think will heal quite nicely I believe that just the barrier cream will be sufficient for protection especially with appropriate offloading. With regard to the larger ulcer which is [name] stage IV in the left ischial/hip location this did require debridement down to necrotic muscle I was able to clean most of this away  there is still some necrotic tissue noted but not much in the muscle though appearing bruised I think has [name] chance to at least healing come back from this in regard to what was remaining at this point. Electronic Signature(s) Signed: 03/03/2019 1:42:59 PM By: Worthy Keeler PA-C Entered  By: Lenda Kelp on 03/03/2019 13:42:58 [name] [name] ([id number]) -------------------------------------------------------------------------------- Physician Orders Details Patient Name: [name] [name]. Date of Service: 03/03/2019 9:45 AM Medical Record Number: [id number] Patient Account Number: [id number] Date of Birth/Sex: July 31, 1928 (85 y.o. F) Treating RN: Huel Coventry Primary Care Provider: Dewaine Oats Other Clinician: Referring Provider: Dewaine Oats Treating Provider/Extender: Linwood Dibbles, HOYT Weeks in Treatment: 0 Verbal / Phone Orders: No Diagnosis Coding ICD-10 Coding Code Description L89.523 Pressure ulcer of left ankle, stage 3 L89.513 Pressure ulcer of right ankle, stage 3 L89.224 Pressure ulcer of left hip, stage 4 I10 Essential (primary) hypertension F01.50 Vascular dementia without behavioral disturbance Wound Cleansing Wound #1 Left,Lateral Calcaneus o Clean wound with Normal Saline. o Cleanse wound with mild soap and water Wound #2 Right,Medial Malleolus o Clean wound with Normal Saline. o Cleanse wound with mild soap and water Wound #3 Left Ischium o Clean wound with Normal Saline. o Cleanse wound with mild soap and water Wound #4 Midline Sacrum o Clean wound with Normal Saline. o Cleanse wound with mild soap and water Anesthetic (add to Medication List) Wound #1 Left,Lateral Calcaneus o Topical Lidocaine 4% cream applied to wound bed prior to debridement (In Clinic Only). Wound #2 Right,Medial Malleolus o Topical Lidocaine 4% cream applied to wound bed prior to debridement (In Clinic Only). Wound #3 Left Ischium o Topical Lidocaine 4% cream applied to  wound bed prior to debridement (In Clinic Only). Wound #4 Midline Sacrum o Topical Lidocaine 4% cream applied to wound bed prior to debridement (In Clinic Only). Skin Barriers/Peri-Wound Care Wound #4 Midline Sacrum o Barrier cream MERSEDES, ALBER [name]. ([id number]) Primary Wound Dressing Wound #1 Left,Lateral Calcaneus o Silver Collagen Wound #2 Right,Medial Malleolus o Silver Collagen Wound #3 Left Ischium o Other: - dakins soaked gauze Secondary Dressing Wound #1 Left,Lateral Calcaneus o ABD and Kerlix/Conform Wound #2 Right,Medial Malleolus o ABD and Kerlix/Conform Wound #3 Left Ischium o Boardered Foam Dressing Dressing Change Frequency Wound #1 Left,Lateral Calcaneus o Change Dressing Monday, Wednesday, Friday Wound #2 Right,Medial Malleolus o Change Dressing Monday, Wednesday, Friday Wound #3 Left Ischium o Change dressing every day. Wound #4 Midline Sacrum o Change dressing every day. - as needed Follow-up Appointments Wound #1 Left,Lateral Calcaneus o Return Appointment in 2 weeks. Wound #2 Right,Medial Malleolus o Return Appointment in 2 weeks. Wound #3 Left Ischium o Return Appointment in 2 weeks. Wound #4 Midline Sacrum o Return Appointment in 2 weeks. Off-Loading Wound #1 Left,Lateral Calcaneus o Roho cushion for wheelchair o Mattress - Patient needs air matress o Turn and reposition every 2 hours o Other: - Offloading boots for bilateral feet Wound #2 Right,Medial Malleolus o Roho cushion for wheelchair o Mattress - Patient needs air matress [name] [name], [name] [name]. ([id number]) o Turn and reposition every 2 hours o Other: - Offloading boots for bilateral feet Wound #3 Left Ischium o Roho cushion for wheelchair o Mattress - Patient needs air matress o Turn and reposition every 2 hours o Other: - Offloading boots for bilateral feet Wound #4 Midline Sacrum o Roho cushion for wheelchair o Mattress - Patient  needs air matress o Turn and reposition every 2 hours o Other: - Offloading boots for bilateral Freight forwarder) Signed: 03/03/2019 5:55:45 PM By: Elliot Gurney, BSN, RN, CWS, Kim RN, BSN Signed: 03/03/2019 6:39:08 PM By: Lenda Kelp PA-C Entered By: Elliot Gurney, BSN, RN, CWS, Kim on 03/03/2019 10:50:23 [name] [name] ([id number]) -------------------------------------------------------------------------------- Problem List Details Patient Name: [name] [name]. Date of Service: 03/03/2019 9:45  AM Medical Record Number: [id number] Patient Account Number: [id number] Date of Birth/Sex: 1928-08-19 (85 y.o. F) Treating RN: Huel Coventry Primary Care Provider: Dewaine Oats Other Clinician: Referring Provider: Dewaine Oats Treating Provider/Extender: Linwood Dibbles, HOYT Weeks in Treatment: 0 Active Problems ICD-10 Evaluated Encounter Code Description Active Date Today Diagnosis L89.623 Pressure ulcer of left heel, stage 3 03/03/2019 No Yes L89.513 Pressure ulcer of right ankle, stage 3 03/03/2019 No Yes L89.224 Pressure ulcer of left hip, stage 4 03/03/2019 No Yes I10 Essential (primary) hypertension 03/03/2019 No Yes F01.50 Vascular dementia without behavioral disturbance 03/03/2019 No Yes Inactive Problems Resolved Problems Electronic Signature(s) Signed: 03/03/2019 10:49:53 AM By: Lenda Kelp PA-C Previous Signature: 03/03/2019 10:24:39 AM Version By: Lenda Kelp PA-C Entered By: Lenda Kelp on 03/03/2019 10:49:52 [name] [name], [name] [name]. ([id number]) -------------------------------------------------------------------------------- Progress Note Details Patient Name: [name] [name]. Date of Service: 03/03/2019 9:45 AM Medical Record Number: [id number] Patient Account Number: [id number] Date of Birth/Sex: [birth year] (85 y.o. F) Treating RN: Huel Coventry Primary Care Provider: Dewaine Oats Other Clinician: Referring Provider: Dewaine Oats Treating Provider/Extender: Linwood Dibbles, HOYT Weeks in  Treatment: 0 Subjective Chief Complaint Information obtained from Patient Multiple pressure ulcers History of Present Illness (HPI) 03/03/2019 on evaluation today patient presents for initial evaluation here in the clinic today concerning issues that she has been having with wounds which are pressure in nature at multiple locations currently. This is on her left heel, right ankle, left hip/ischial location, and all her to varying degrees of severity and depth. The worst is on the left ischial location. At this time the patient has been tolerating dressing changes although I am not exactly sure the dressings that have been utilized prior to coming in today. She does have some necrotic tissue noted at several locations this can require at least some degree of cleaning prior to application of dressings going forward. The patient does have dementia and unfortunately in recent months has become increasingly immobile requiring more significant treatment she was just recently moved roughly 2 weeks ago from assisted living to [name] skilled nursing facility at [facility]. Based on what I am seeing currently these pressure injuries occurred prior to that 2-week time I do not see any new or obvious pressure injury at this point. I discussed with the patient as well as her family member that this does appear to be doing better in my opinion than likely where things started. They are unsure as to whether or not she has an air mattress at the facility I do think that would be something that would be appropriate and good for her to have but at the moment I do not know for sure whether that is already in place if not that definitely is can be 1 of the recommendations. Also think [name] Roho cushion for her wheelchair would be good as well as Prevalon offloading boots. The patient currently does not have any severe pain although when I was cleaning some of the areas she did note there was some discomfort at several  of the locations. She does have [name] history of hypertension as well. Patient History Unable to Obtain Patient History due to Dementia. Allergies No Known Drug Allergies Social History Never smoker. Medical History Eyes Patient has history of Glaucoma Denies history of Cataracts, Optic Neuritis Ear/Nose/Mouth/Throat Denies history of Chronic sinus problems/congestion, Middle ear problems Hematologic/Lymphatic Denies history of Anemia, Hemophilia, Human Immunodeficiency Virus, Lymphedema, Sickle Cell Disease Respiratory Denies history of Aspiration, Asthma, Chronic Obstructive Pulmonary Disease (COPD),  Pneumothorax, Sleep Apnea, Tuberculosis Cardiovascular Patient has history of Hypertension Denies history of Angina, Arrhythmia, Congestive Heart Failure, Coronary Artery Disease, Deep Vein Thrombosis, Mone, Eve [name]. ([id number]) Hypotension, Myocardial Infarction, Peripheral Arterial Disease, Peripheral Venous Disease, Phlebitis, Vasculitis Gastrointestinal Denies history of Cirrhosis , Colitis, Crohn s, Hepatitis [name], Hepatitis B, Hepatitis C Endocrine Denies history of Type I Diabetes, Type II Diabetes Genitourinary Denies history of End Stage Renal Disease Immunological Denies history of Lupus Erythematosus, Raynaud s, Scleroderma Integumentary (Skin) Denies history of History of Burn, History of pressure wounds Musculoskeletal Patient has history of Osteoarthritis Denies history of Gout, Rheumatoid Arthritis, Osteomyelitis Neurologic Patient has history of Dementia Denies history of Neuropathy, Quadriplegia, Paraplegia, Seizure Disorder Oncologic Denies history of Received Chemotherapy, Received Radiation Psychiatric Denies history of Anorexia/bulimia, Confinement Anxiety Review of Systems (ROS) Constitutional Symptoms (General Health) Denies complaints or symptoms of Fatigue, Fever, Chills, Marked Weight Change. Ear/Nose/Mouth/Throat Denies complaints or symptoms of  Difficult clearing ears, Sinusitis, HOH Hematologic/Lymphatic Denies complaints or symptoms of Bleeding / Clotting Disorders, Human Immunodeficiency Virus. Respiratory Denies complaints or symptoms of Chronic or frequent coughs, Shortness of Breath. Cardiovascular Denies complaints or symptoms of Chest pain, LE edema. Gastrointestinal Denies complaints or symptoms of Frequent diarrhea, Nausea, Vomiting. Endocrine Complains or has symptoms of Thyroid disease - Hypothyroidism. Denies complaints or symptoms of Hepatitis, Polydypsia (Excessive Thirst). Genitourinary Complains or has symptoms of Incontinence/dribbling. Denies complaints or symptoms of Kidney failure/ Dialysis. Immunological Denies complaints or symptoms of Hives, Itching. Integumentary (Skin) Complains or has symptoms of Wounds, Bleeding or bruising tendency. Denies complaints or symptoms of Breakdown, Swelling. Musculoskeletal Complains or has symptoms of Muscle Weakness. Denies complaints or symptoms of Muscle Pain. Neurologic Complains or has symptoms of Focal/Weakness. Psychiatric Complains or has symptoms of Anxiety, GAD, MDD Bolender, Carys [name]. ([id number]) Objective Constitutional sitting or standing blood pressure is within target range for patient.. pulse regular and within target range for patient.Marland Kitchen respirations regular, non-labored and within target range for patient.Marland Kitchen temperature within target range for patient.. Well- nourished and well-hydrated in no acute distress. Vitals Time Taken: 9:30 AM, Height: 67 in, Source: Stated, Weight: 140 lbs, Source: Stated, BMI: 21.9, Temperature: 98.5 F, Pulse: 81 bpm, Respiratory Rate: 16 breaths/min, Blood Pressure: 119/62 mmHg. Eyes conjunctiva clear no eyelid edema noted. pupils equal round and reactive to light and accommodation. Ears, Nose, Mouth, and Throat no gross abnormality of ear auricles or external auditory canals. normal hearing noted during conversation.  mucus membranes moist. Respiratory normal breathing without difficulty. clear to auscultation bilaterally. Cardiovascular regular rate and rhythm with normal S1, S2. 1+ dorsalis pedis/posterior tibialis pulses. no clubbing, cyanosis, significant edema, Gastrointestinal (GI) soft, non-tender, non-distended, +BS. no ventral hernia noted. Musculoskeletal Patient unable to walk without assistance. no significant deformity or arthritic changes, no loss or range of motion, no clubbing. Psychiatric Patient is not able to cooperate in decision making regarding care. Patient is oriented to person only. pleasant and cooperative. General Notes: Upon evaluation today patient's wounds on her heel and her ankle of lower extremities only required manual/mechanical debridement there was no sharp debridement necessary I was able to remove the slough and overall she seems to be doing quite well in that regard. I do think is still that it takes some time and I do believe she needs to be very careful as far as appropriate offloading is concerned and all that will be documented in her plan as well. In my opinion she seems to have fairly good blood flow into her lower  extremities based on what I am seeing she did have some bleeding even after mechanical debridement. In regard to her sacral region this appears to be doing quite well and though there is some subcutaneous layer noted there did not appear to be any evidence of deeper wounds this is [name] stage III but nonetheless I think will heal quite nicely I believe that just the barrier cream will be sufficient for protection especially with appropriate offloading. With regard to the larger ulcer which is [name] stage IV in the left ischial/hip location this did require debridement down to necrotic muscle I was able to clean most of this away there is still some necrotic tissue noted but not much in the muscle though appearing bruised I think has [name] chance to at least  healing come back from this in regard to what was remaining at this point. Integumentary (Hair, Skin) Wound #1 status is Open. Original cause of wound was Pressure Injury. The wound is located on the Left,Lateral Calcaneus. The wound measures 1cm length x 0.7cm width x 0.2cm depth; 0.55cm^2 area and 0.11cm^3 volume. There is Fat Layer (Subcutaneous Tissue) Exposed exposed. There is no tunneling or undermining noted. There is [name] large amount of serosanguineous drainage noted. The wound margin is epibole. There is small (1-33%) pale granulation within the wound bed. There is [name] large (67-100%) amount of necrotic tissue within the wound bed including Adherent Slough. Wound #2 status is Open. Original cause of wound was Pressure Injury. The wound is located on the Right,Medial Malleolus. [name] [name], [name] [name]. ([id number]) The wound measures 1.5cm length x 2.5cm width x 0.5cm depth; 2.945cm^2 area and 1.473cm^3 volume. There is Fat Layer (Subcutaneous Tissue) Exposed exposed. There is no tunneling or undermining noted. There is [name] large amount of serosanguineous drainage noted. The wound margin is epibole. There is no granulation within the wound bed. There is [name] small (1-33%) amount of necrotic tissue within the wound bed including Adherent Slough. Wound #3 status is Open. Original cause of wound was Pressure Injury. The wound is located on the Left Ischium. The wound measures 3.5cm length x 4.2cm width x 2.2cm depth; 11.545cm^2 area and 25.4cm^3 volume. There is muscle and Fat Layer (Subcutaneous Tissue) Exposed exposed. There is no tunneling noted, however, there is undermining starting at 4:00 and ending at 11:00 with [name] maximum distance of 2.4cm. There is [name] large amount of serosanguineous drainage noted. The wound margin is thickened. There is no granulation within the wound bed. There is [name] large (67-100%) amount of necrotic tissue within the wound bed including Eschar and Adherent Slough. Wound #4 status is  Open. Original cause of wound was Pressure Injury. The wound is located on the Midline Sacrum. The wound measures 5.5cm length x 4cm width x 0.1cm depth; 17.279cm^2 area and 1.728cm^3 volume. The wound is limited to skin breakdown. There is no tunneling or undermining noted. There is [name] medium amount of serous drainage noted. The wound margin is indistinct and nonvisible. There is medium (34-66%) pink granulation within the wound bed. There is [name] medium (34-66%) amount of necrotic tissue within the wound bed including Adherent Slough. Assessment Active Problems ICD-10 Pressure ulcer of left heel, stage 3 Pressure ulcer of right ankle, stage 3 Pressure ulcer of left hip, stage 4 Essential (primary) hypertension Vascular dementia without behavioral disturbance Procedures Wound #3 Pre-procedure diagnosis of Wound #3 is [name] Pressure Ulcer located on the Left Ischium . There was [name] Excisional Skin/Subcutaneous Tissue/Muscle Debridement with [name] total area of  14.7 sq cm performed by STONE III, HOYT E., PA-C. With the following instrument(s): Forceps, and Scissors to remove Viable and Non-Viable tissue/material. Material removed includes Fat, Muscle, Subcutaneous Tissue, and Slough after achieving pain control using Lidocaine. No specimens were taken. [name] time out was conducted at 10:35, prior to the start of the procedure. [name] Minimum amount of bleeding was controlled with Pressure. The procedure was tolerated well. Post Debridement Measurements: 3.5cm length x 4.2cm width x 2.2cm depth; 25.4cm^3 volume. Post debridement Stage noted as Category/Stage IV. Character of Wound/Ulcer Post Debridement is stable. Post procedure Diagnosis Wound #3: Same as Pre-Procedure Plan Wound Cleansing: Bidinger, Halimah [name]. ([id number]) Wound #1 Left,Lateral Calcaneus: Clean wound with Normal Saline. Cleanse wound with mild soap and water Wound #2 Right,Medial Malleolus: Clean wound with Normal Saline. Cleanse wound with mild  soap and water Wound #3 Left Ischium: Clean wound with Normal Saline. Cleanse wound with mild soap and water Wound #4 Midline Sacrum: Clean wound with Normal Saline. Cleanse wound with mild soap and water Anesthetic (add to Medication List): Wound #1 Left,Lateral Calcaneus: Topical Lidocaine 4% cream applied to wound bed prior to debridement (In Clinic Only). Wound #2 Right,Medial Malleolus: Topical Lidocaine 4% cream applied to wound bed prior to debridement (In Clinic Only). Wound #3 Left Ischium: Topical Lidocaine 4% cream applied to wound bed prior to debridement (In Clinic Only). Wound #4 Midline Sacrum: Topical Lidocaine 4% cream applied to wound bed prior to debridement (In Clinic Only). Skin Barriers/Peri-Wound Care: Wound #4 Midline Sacrum: Barrier cream Primary Wound Dressing: Wound #1 Left,Lateral Calcaneus: Silver Collagen Wound #2 Right,Medial Malleolus: Silver Collagen Wound #3 Left Ischium: Other: - dakins soaked gauze Secondary Dressing: Wound #1 Left,Lateral Calcaneus: ABD and Kerlix/Conform Wound #2 Right,Medial Malleolus: ABD and Kerlix/Conform Wound #3 Left Ischium: Boardered Foam Dressing Dressing Change Frequency: Wound #1 Left,Lateral Calcaneus: Change Dressing Monday, Wednesday, Friday Wound #2 Right,Medial Malleolus: Change Dressing Monday, Wednesday, Friday Wound #3 Left Ischium: Change dressing every day. Wound #4 Midline Sacrum: Change dressing every day. - as needed Follow-up Appointments: Wound #1 Left,Lateral Calcaneus: Return Appointment in 2 weeks. Wound #2 Right,Medial Malleolus: Return Appointment in 2 weeks. Wound #3 Left Ischium: Return Appointment in 2 weeks. Wound #4 Midline Sacrum: Return Appointment in 2 weeks. Off-Loading: Wound #1 Left,Lateral Calcaneus: Roho cushion for wheelchair Mattress - Patient needs air matress [name] [name], [name] [name]. ([id number]) Turn and reposition every 2 hours Other: - Offloading boots for bilateral  feet Wound #2 Right,Medial Malleolus: Roho cushion for wheelchair Mattress - Patient needs air matress Turn and reposition every 2 hours Other: - Offloading boots for bilateral feet Wound #3 Left Ischium: Roho cushion for wheelchair Mattress - Patient needs air matress Turn and reposition every 2 hours Other: - Offloading boots for bilateral feet Wound #4 Midline Sacrum: Roho cushion for wheelchair Mattress - Patient needs air matress Turn and reposition every 2 hours Other: - Offloading boots for bilateral feet 1. My suggestion at this time is good to be that we go ahead and initiate treatment in the ischial/hip location with Dakin soaked gauze dressings I believe this will be appropriate to help continue to clean this area up. Once we get it nice and clean I think [name] wound VAC would likely be beneficial at this location. 2. I am going to suggest in regard to the bilateral lower extremities that we use silver collagen both on the heel as well as the ankle which I think will allow this area to heal and more appropriately  as well. 3. With regard to the sacral region I think that just using [name] barrier cream will be sufficient I recommended zinc oxide at this point. 4. I am to recommend appropriate offloading including Prevalon offloading boots for the bilateral feet, [name] Roho cushion for her wheelchair, and air mattress for her obviously to sleep on, and she does need to have appropriate offloading with turning and repositioning every 2-3 hours but not extending past the 3-hour mark by any means. Obviously the other offloading devices are not [name] substitute for appropriate repositioning they just help during the time she is in those positions. We will see patient back for reevaluation in 1 week here in the clinic. If anything worsens or changes patient will contact our office for additional recommendations. Electronic Signature(s) Signed: 03/03/2019 1:44:39 PM By: Lenda Kelp PA-C Entered  By: Lenda Kelp on 03/03/2019 13:44:39 [name] [name], [name] [name] ([id number]) -------------------------------------------------------------------------------- ROS/PFSH Details Patient Name: [name] [name]. Date of Service: 03/03/2019 9:45 AM Medical Record Number: [id number] Patient Account Number: [id number] Date of Birth/Sex: 1928-06-29 (85 y.o. F) Treating RN: Arnette Norris Primary Care Provider: Dewaine Oats Other Clinician: Referring Provider: Dewaine Oats Treating Provider/Extender: STONE III, HOYT Weeks in Treatment: 0 Unable to Obtain Patient History due to oo Dementia Constitutional Symptoms (General Health) Complaints and Symptoms: Negative for: Fatigue; Fever; Chills; Marked Weight Change Ear/Nose/Mouth/Throat Complaints and Symptoms: Negative for: Difficult clearing ears; Sinusitis Review of System Notes: HOH Medical History: Negative for: Chronic sinus problems/congestion; Middle ear problems Hematologic/Lymphatic Complaints and Symptoms: Negative for: Bleeding / Clotting Disorders; Human Immunodeficiency Virus Medical History: Negative for: Anemia; Hemophilia; Human Immunodeficiency Virus; Lymphedema; Sickle Cell Disease Respiratory Complaints and Symptoms: Negative for: Chronic or frequent coughs; Shortness of Breath Medical History: Negative for: Aspiration; Asthma; Chronic Obstructive Pulmonary Disease (COPD); Pneumothorax; Sleep Apnea; Tuberculosis Cardiovascular Complaints and Symptoms: Negative for: Chest pain; LE edema Medical History: Positive for: Hypertension Negative for: Angina; Arrhythmia; Congestive Heart Failure; Coronary Artery Disease; Deep Vein Thrombosis; Hypotension; Myocardial Infarction; Peripheral Arterial Disease; Peripheral Venous Disease; Phlebitis; Vasculitis Gastrointestinal Complaints and Symptoms: Negative for: Frequent diarrhea; Nausea; Vomiting Medical HistoryNATALYIA, [name] [name]. ([id number]) Negative for: Cirrhosis ; Colitis; Crohnos;  Hepatitis [name]; Hepatitis B; Hepatitis C Endocrine Complaints and Symptoms: Positive for: Thyroid disease - Hypothyroidism Negative for: Hepatitis; Polydypsia (Excessive Thirst) Medical History: Negative for: Type I Diabetes; Type II Diabetes Genitourinary Complaints and Symptoms: Positive for: Incontinence/dribbling Negative for: Kidney failure/ Dialysis Medical History: Negative for: End Stage Renal Disease Immunological Complaints and Symptoms: Negative for: Hives; Itching Medical History: Negative for: Lupus Erythematosus; Raynaudos; Scleroderma Integumentary (Skin) Complaints and Symptoms: Positive for: Wounds; Bleeding or bruising tendency Negative for: Breakdown; Swelling Medical History: Negative for: History of Burn; History of pressure wounds Musculoskeletal Complaints and Symptoms: Positive for: Muscle Weakness Negative for: Muscle Pain Medical History: Positive for: Osteoarthritis Negative for: Gout; Rheumatoid Arthritis; Osteomyelitis Neurologic Complaints and Symptoms: Positive for: Focal/Weakness Medical History: Positive for: Dementia Negative for: Neuropathy; Quadriplegia; Paraplegia; Seizure Disorder Psychiatric Complaints and Symptoms: Positive for: Anxiety [name] [name], [name] [name]. ([id number]) Review of System Notes: GAD, MDD Medical History: Negative for: Anorexia/bulimia; Confinement Anxiety Eyes Medical History: Positive for: Glaucoma Negative for: Cataracts; Optic Neuritis Oncologic Medical History: Negative for: Received Chemotherapy; Received Radiation HBO Extended History Items Eyes: Glaucoma Immunizations Pneumococcal Vaccine: Received Pneumococcal Vaccination: No Implantable Devices None Family and Social History Never smoker Electronic Signature(s) Signed: 03/03/2019 4:37:29 PM By: Arnette Norris Signed: 03/03/2019 6:39:08 PM By: Lenda Kelp PA-C Entered By: Arnette Norris on  03/03/2019 09:53:15 [name] [name], [name] [name].  ([id number]) -------------------------------------------------------------------------------- SuperBill Details Patient Name: KRISTELL, WOODING [name]. Date of Service: 03/03/2019 Medical Record Number: [id number] Patient Account Number: [id number] Date of Birth/Sex: [birth year] (85 y.o. F) Treating RN: Huel Coventry Primary Care Provider: Dewaine Oats Other Clinician: Referring Provider: Dewaine Oats Treating Provider/Extender: Linwood Dibbles, HOYT Weeks in Treatment: 0 Diagnosis Coding ICD-10 Codes Code Description [phone] Pressure ulcer of left heel, stage 3 L89.513 Pressure ulcer of right ankle, stage 3 L89.224 Pressure ulcer of left hip, stage 4 I10 Essential (primary) hypertension F01.50 Vascular dementia without behavioral disturbance Facility Procedures CPT4 Code: [id number] Description: 99213 - WOUND CARE VISIT-LEV 3 EST PT Modifier: Quantity: 1 CPT4 Code: [id number] Description: 11043 - DEB MUSC/FASCIA 20 SQ CM/< ICD-10 Diagnosis Description L89.224 Pressure ulcer of left hip, stage 4 Modifier: Quantity: 1 Physician Procedures CPT4 Code: [id number] Description: 99204 - WC PHYS LEVEL 4 - NEW PT ICD-10 Diagnosis Description L89.623 Pressure ulcer of left heel, stage 3 L89.513 Pressure ulcer of right ankle, stage 3 L89.224 Pressure ulcer of left hip, stage 4 I10 Essential (primary) hypertension Modifier: 25 Quantity: 1 CPT4 Code: [id number] Description: 11043 - WC PHYS DEBR MUSCLE/FASCIA 20 SQ CM ICD-10 Diagnosis Description L89.224 Pressure ulcer of left hip, stage 4 Modifier: Quantity: 1 Electronic Signature(s) Signed: 03/03/2019 1:45:00 PM By: Lenda Kelp PA-C Entered By: Lenda Kelp on 03/03/2019 13:45:00

## 2019-03-04 NOTE — Progress Notes (Signed)
BRADLEY, HANDYSIDE ([id number]) Visit Report for 03/03/2019 Allergy List Details Patient Name: [name] [name], [name] Date of Service: 03/03/2019 9:45 AM Medical Record Number: [id number] Patient Account Number: [id number] Date of Birth/Sex: [birth year] (85 y.o. F) Treating RN: Arnette Norris Primary Care Sally Reimers: Dewaine Oats Other Clinician: Referring Ladaisha Portillo: Dewaine Oats Treating Valor Quaintance/Extender: STONE III, HOYT Weeks in Treatment: 0 Allergies Active Allergies No Known Drug Allergies Allergy Notes Electronic Signature(s) Signed: 03/03/2019 4:37:29 PM By: Arnette Norris Entered By: Arnette Norris on 03/03/2019 09:44:05 [name], [name] [name] ([id number]) -------------------------------------------------------------------------------- Arrival Information Details Patient Name: [name] Lye A. Date of Service: 03/03/2019 9:45 AM Medical Record Number: [id number] Patient Account Number: [id number] Date of Birth/Sex: [birth year] (85 y.o. F) Treating RN: Arnette Norris Primary Care Torrie Namba: Dewaine Oats Other Clinician: Referring Valente Fosberg: Dewaine Oats Treating Lamyia Cdebaca/Extender: Linwood Dibbles, HOYT Weeks in Treatment: 0 Visit Information Patient Arrived: Wheel Chair Arrival Time: 09:34 Accompanied By: Bunnie Domino Transfer Assistance: EasyPivot Patient Lift Patient Identification Verified: Yes Secondary Verification Process Yes Completed: Electronic Signature(s) Signed: 03/03/2019 4:37:29 PM By: Arnette Norris Entered By: Arnette Norris on 03/03/2019 09:35:14 [name], [name] [name] ([id number]) -------------------------------------------------------------------------------- Clinic Level of Care Assessment Details Patient Name: [name] Lye A. Date of Service: 03/03/2019 9:45 AM Medical Record Number: [id number] Patient Account Number: [id number] Date of Birth/Sex: 1929-04-01 (85 y.o. F) Treating RN: Huel Coventry Primary Care Natividad Schlosser: Dewaine Oats Other Clinician: Referring Else Habermann: Dewaine Oats Treating  Annleigh Knueppel/Extender: Linwood Dibbles, HOYT Weeks in Treatment: 0 Clinic Level of Care Assessment Items TOOL 1 Quantity Score []  - Use when EandM and Procedure is performed on INITIAL visit 0 ASSESSMENTS - Nursing Assessment / Reassessment X - General Physical Exam (combine w/ comprehensive assessment (listed just below) when 1 20 performed on new pt. evals) X- 1 25 Comprehensive Assessment (HX, ROS, Risk Assessments, Wounds Hx, etc.) ASSESSMENTS - Wound and Skin Assessment / Reassessment []  - Dermatologic / Skin Assessment (not related to wound area) 0 ASSESSMENTS - Ostomy and/or Continence Assessment and Care []  - Incontinence Assessment and Management 0 []  - 0 Ostomy Care Assessment and Management (repouching, etc.) PROCESS - Coordination of Care X - Simple Patient / Family Education for ongoing care 1 15 []  - 0 Complex (extensive) Patient / Family Education for ongoing care X- 1 10 Staff obtains , Records, Test Results / Process Orders []  - 0 Staff telephones HHA, Nursing Homes / Clarify orders / etc []  - 0 Routine Transfer to another Facility (non-emergent condition) []  - 0 Routine Hospital Admission (non-emergent condition) X- 1 15 New Admissions / / Ordering NPWT, Apligraf, etc. []  - 0 Emergency Hospital Admission (emergent condition) PROCESS - Special Needs []  - Pediatric / Minor Patient Management 0 []  - 0 Isolation Patient Management []  - 0 Hearing / Language / Visual special needs []  - 0 Assessment of Community assistance (transportation, D/C planning, etc.) []  - 0 Additional assistance / Altered mentation []  - 0 Support Surface(s) Assessment (bed, cushion, seat, etc.) [name], [name] ( ) INTERVENTIONS - Miscellaneous []  - External ear exam 0 []  - 0 Patient Transfer (multiple staff / / Similar devices) []  - 0 Simple Staple / Suture removal (25 or less) []  - 0 Complex Staple / Suture removal (26 or more) []  -  0 Hypo/Hyperglycemic Management (do not check if billed separately) []  - 0 Ankle / Brachial Index (ABI) - do not check if billed separately Has the patient been seen at the hospital within the last three years: Yes Total Score: 85 Level Of Care: New/Established -  Level 3 Electronic Signature(s) Signed: 03/03/2019 5:55:45 PM By: Elliot Gurney, BSN, RN, CWS, Kim RN, BSN Entered By: Elliot Gurney, BSN, RN, CWS, Kim on 03/03/2019 10:42:41 [name] ([id number]) -------------------------------------------------------------------------------- Encounter Discharge Information Details Patient Name: [name] Lye A. Date of Service: 03/03/2019 9:45 AM Medical Record Number: [id number] Patient Account Number: [id number] Date of Birth/Sex: 01-28-1929 (85 y.o. F) Treating RN: Huel Coventry Primary Care Lemoyne Scarpati: Dewaine Oats Other Clinician: Referring Kaylynn Chamblin: Dewaine Oats Treating Lason Eveland/Extender: Linwood Dibbles, HOYT Weeks in Treatment: 0 Encounter Discharge Information Items Post Procedure Vitals Discharge Condition: Stable Temperature (F): 98.5 Ambulatory Status: Wheelchair Pulse (bpm): 81 Discharge Destination: Home Respiratory Rate (breaths/min): 16 Transportation: Private Auto Blood Pressure (mmHg): 119/62 Accompanied By: family Schedule Follow-up Appointment: Yes Clinical Summary of Care: Electronic Signature(s) Signed: 03/03/2019 5:55:45 PM By: Elliot Gurney, BSN, RN, CWS, Kim RN, BSN Entered By: Elliot Gurney, BSN, RN, CWS, Kim on 03/03/2019 10:53:42 [name] [name] ([id number]) -------------------------------------------------------------------------------- Lower Extremity Assessment Details Patient Name: [name] Lye A. Date of Service: 03/03/2019 9:45 AM Medical Record Number: [id number] Patient Account Number: [id number] Date of Birth/Sex: July 17, 1928 (85 y.o. F) Treating RN: Arnette Norris Primary Care Kumari Sculley: Dewaine Oats Other Clinician: Referring Tysean Vandervliet: Dewaine Oats Treating Nariya Neumeyer/Extender: STONE III,  HOYT Weeks in Treatment: 0 Vascular Assessment Pulses: Dorsalis Pedis Palpable: [Left:Yes] [Right:Yes] Posterior Tibial Palpable: [Left:Yes] [Right:Yes] Electronic Signature(s) Signed: 03/03/2019 4:37:29 PM By: Arnette Norris Entered By: Arnette Norris on 03/03/2019 10:08:02 [name] Lye A. ([id number]) -------------------------------------------------------------------------------- Multi Wound Chart Details Patient Name: [name] Lye A. Date of Service: 03/03/2019 9:45 AM Medical Record Number: [id number] Patient Account Number: [id number] Date of Birth/Sex: 06/04/1928 (85 y.o. F) Treating RN: Huel Coventry Primary Care Reality Dejonge: Dewaine Oats Other Clinician: Referring Rolena Knutson: Dewaine Oats Treating [name] [name]: Linwood Dibbles, HOYT Weeks in Treatment: 0 Vital Signs Height(in): 67 Pulse(bpm): 81 Weight(lbs): 140 Blood Pressure(mmHg): 119/62 Body Mass Index(BMI): 22 Temperature(F): 98.5 Respiratory Rate 16 (breaths/min): Photos: Wound Location: Left Malleolus - Lateral Right Malleolus - Medial Left Ischium Wounding Event: Pressure Injury Pressure Injury Pressure Injury Primary Etiology: Pressure Ulcer Pressure Ulcer Pressure Ulcer Comorbid History: Glaucoma, Hypertension, Glaucoma, Hypertension, Glaucoma, Hypertension, Osteoarthritis, Dementia Osteoarthritis, Dementia Osteoarthritis, Dementia Date Acquired: 07/18/2018 07/18/2018 07/18/2018 Weeks of Treatment: 0 0 0 Wound Status: Open Open Open Clustered Wound: No No No Clustered Quantity: N/A N/A N/A Measurements L x W x D 1x0.7x0.2 1.5x2.5x0.5 3.5x4.2x2.2 (cm) Area (cm) : 0.55 2.945 11.545 Volume (cm) : 0.11 1.473 25.4 % Reduction in Area: 0.00% N/A N/A % Reduction in Volume: 0.00% N/A N/A Starting Position 1 4 (o'clock): Ending Position 1 11 (o'clock): Maximum Distance 1 (cm): 2.4 Undermining: No No Yes Classification: Category/Stage III Category/Stage II Category/Stage IV Exudate Amount: Large Large Large Exudate  Type: Serosanguineous Serosanguineous Serosanguineous Exudate Color: red, brown red, brown red, brown Wound Margin: Epibole Epibole Thickened Granulation Amount: Small (1-33%) None Present (0%) None Present (0%) Granulation Quality: Pale N/A N/A [name], [name] ([id number]) Necrotic Amount: Large (67-100%) Small (1-33%) Large (67-100%) Necrotic Tissue: Adherent Slough Adherent Slough Eschar, Adherent Slough Exposed Structures: Fat Layer (Subcutaneous Fat Layer (Subcutaneous Fat Layer (Subcutaneous Tissue) Exposed: Yes Tissue) Exposed: Yes Tissue) Exposed: Yes Fascia: No Fascia: No Muscle: Yes Tendon: No Tendon: No Fascia: No Muscle: No Muscle: No Tendon: No Joint: No Joint: No Joint: No Bone: No Bone: No Bone: No Epithelialization: None None None Wound Number: 4 N/A N/A Photos: N/A N/A Wound Location: Sacrum - Midline N/A N/A Wounding Event: Pressure Injury N/A N/A Primary Etiology: Pressure Ulcer N/A N/A Comorbid History: Glaucoma, Hypertension, N/A N/A  Osteoarthritis, Dementia Date Acquired: 07/18/2018 N/A N/A Weeks of Treatment: 0 N/A N/A Wound Status: Open N/A N/A Clustered Wound: Yes N/A N/A Clustered Quantity: 3 N/A N/A Measurements L x W x D 5.5x4x0.1 N/A N/A (cm) Area (cm) : 17.279 N/A N/A Volume (cm) : 1.728 N/A N/A % Reduction in Area: 0.00% N/A N/A % Reduction in Volume: 0.00% N/A N/A Undermining: No N/A N/A Classification: Category/Stage III N/A N/A Exudate Amount: Medium N/A N/A Exudate Type: Serous N/A N/A Exudate Color: amber N/A N/A Wound Margin: Indistinct, nonvisible N/A N/A Granulation Amount: Medium (34-66%) N/A N/A Granulation Quality: Pink N/A N/A Necrotic Amount: Medium (34-66%) N/A N/A Necrotic Tissue: Adherent Slough N/A N/A Exposed Structures: Fascia: No N/A N/A Fat Layer (Subcutaneous Tissue) Exposed: No Tendon: No Muscle: No Joint: No Bone: No Limited to Skin Breakdown Epithelialization: Medium (34-66%) N/A N/A Treatment  Notes KATELIND, PYTEL ([id number]) Electronic Signature(s) Signed: 03/03/2019 5:55:45 PM By: Elliot Gurney, BSN, RN, CWS, Kim RN, BSN Entered By: Elliot Gurney, BSN, RN, CWS, Kim on 03/03/2019 10:35:22 [name] ([id number]) -------------------------------------------------------------------------------- Multi-Disciplinary Care Plan Details Patient Name: [name] Lye A. Date of Service: 03/03/2019 9:45 AM Medical Record Number: [id number] Patient Account Number: [id number] Date of Birth/Sex: 1928-07-02 (85 y.o. F) Treating RN: Huel Coventry Primary Care Mishelle Hassan: Dewaine Oats Other Clinician: Referring Ronalda Walpole: Dewaine Oats Treating Vedansh Kerstetter/Extender: Linwood Dibbles, HOYT Weeks in Treatment: 0 Active Inactive Abuse / Safety / Falls / Self Care Management Nursing Diagnoses: Potential for falls Goals: Patient will remain injury free related to falls Date Initiated: 03/03/2019 Target Resolution Date: 03/16/2019 Goal Status: Active Interventions: Assess fall risk on admission and as needed Notes: Medication Nursing Diagnoses: Knowledge deficit related to medication safety: actual or potential Goals: Patient/caregiver will demonstrate understanding of all current medications Date Initiated: 03/03/2019 Target Resolution Date: 03/16/2019 Goal Status: Active Interventions: Assess for medication contraindications each visit where new medications are prescribed Treatment Activities: New medication prescribed at Wound Center : 03/03/2019 Notes: Necrotic Tissue Nursing Diagnoses: Impaired tissue integrity related to necrotic/devitalized tissue Goals: Necrotic/devitalized tissue will be minimized in the wound bed Date Initiated: 03/03/2019 Target Resolution Date: 03/16/2019 Goal Status: Active [name] [name], [name] ([id number]) Interventions: Assess patient pain level pre-, during and post procedure and prior to discharge Treatment Activities: Apply topical anesthetic as ordered :  03/03/2019 Notes: Nutrition Nursing Diagnoses: Potential for alteratiion in Nutrition/Potential for imbalanced nutrition Goals: Patient/caregiver agrees to and verbalizes understanding of need to use nutritional supplements and/or vitamins as prescribed Date Initiated: 03/03/2019 Target Resolution Date: 03/16/2019 Goal Status: Active Interventions: Provide education on nutrition Notes: Orientation to the Wound Care Program Nursing Diagnoses: Knowledge deficit related to the wound healing center program Goals: Patient/caregiver will verbalize understanding of the Wound Healing Center Program Date Initiated: 03/03/2019 Target Resolution Date: 03/16/2019 Goal Status: Active Interventions: Provide education on orientation to the wound center Notes: Pressure Nursing Diagnoses: Knowledge deficit related to management of pressures ulcers Goals: Patient/caregiver will verbalize understanding of pressure ulcer management Date Initiated: 03/03/2019 Target Resolution Date: 03/16/2019 Goal Status: Active Interventions: Assess: immobility, friction, shearing, incontinence upon admission and as needed Provide education on pressure ulcers Treatment Activities: Patient referred for home evaluation of offloading devices/mattresses : 03/03/2019 [name] [name], [name] ([id number]) Patient referred for pressure reduction/relief devices : 03/03/2019 Pressure reduction/relief device ordered : 03/03/2019 Notes: Wound/Skin Impairment Nursing Diagnoses: Impaired tissue integrity Goals: Ulcer/skin breakdown will have a volume reduction of 30% by week 4 Date Initiated: 03/03/2019 Target Resolution Date: 03/03/2019 Goal Status: Active Interventions: Assess ulceration(s) every visit Treatment  Activities: Skin care regimen initiated : 03/03/2019 Notes: Electronic Signature(s) Signed: 03/03/2019 5:55:45 PM By: Elliot Gurney, BSN, RN, CWS, Kim RN, BSN Entered By: Elliot Gurney, BSN, RN, CWS, Kim on 03/03/2019  10:35:09 [name] ([id number]) -------------------------------------------------------------------------------- Pain Assessment Details Patient Name: [name] Lye A. Date of Service: 03/03/2019 9:45 AM Medical Record Number: [id number] Patient Account Number: [id number] Date of Birth/Sex: 1928/06/28 (85 y.o. F) Treating RN: Arnette Norris Primary Care Taylin Mans: Dewaine Oats Other Clinician: Referring Etrulia Zarr: Dewaine Oats Treating Aariya Ferrick/Extender: STONE III, HOYT Weeks in Treatment: 0 Active Problems Location of Pain Severity and Description of Pain Patient Has Paino No Site Locations Pain Management and Medication Current Pain Management: Electronic Signature(s) Signed: 03/03/2019 4:37:29 PM By: Arnette Norris Entered By: Arnette Norris on 03/03/2019 09:37:50 Stavros, [name] [name] ([id number]) -------------------------------------------------------------------------------- Patient/Caregiver Education Details Patient Name: [name] Lye A. Date of Service: 03/03/2019 9:45 AM Medical Record Number: [id number] Patient Account Number: [id number] Date of Birth/Gender: 12-24-1928 (85 y.o. F) Treating RN: Huel Coventry Primary Care Physician: Dewaine Oats Other Clinician: Referring Physician: Dewaine Oats Treating Physician/Extender: Skeet Simmer in Treatment: 0 Education Assessment Education Provided To: Caregiver Education Topics Provided Pressure: Handouts: Pressure Ulcers: Care and Offloading Methods: Demonstration, Explain/Verbal Responses: Return demonstration correctly Electronic Signature(s) Signed: 03/03/2019 5:55:45 PM By: Elliot Gurney, BSN, RN, CWS, Kim RN, BSN Entered By: Elliot Gurney, BSN, RN, CWS, Kim on 03/03/2019 10:47:02 [name] ([id number]) -------------------------------------------------------------------------------- Wound Assessment Details Patient Name: [name] Lye A. Date of Service: 03/03/2019 9:45 AM Medical Record Number: [id number] Patient Account Number:  [id number] Date of Birth/Sex: 04-09-1929 (85 y.o. F) Treating RN: Arnette Norris Primary Care Mackey Varricchio: Dewaine Oats Other Clinician: Referring Jayion Schneck: Dewaine Oats Treating Mieka Leaton/Extender: STONE III, HOYT Weeks in Treatment: 0 Wound Status Wound Number: 1 Primary Pressure Ulcer Etiology: Wound Location: Left Malleolus - Lateral Wound Status: Open Wounding Event: Pressure Injury Comorbid Glaucoma, Hypertension, Osteoarthritis, Date Acquired: 07/18/2018 History: Dementia Weeks Of Treatment: 0 Clustered Wound: No Photos Wound Measurements Length: (cm) 1 Width: (cm) 0.7 Depth: (cm) 0.2 Area: (cm) 0.55 Volume: (cm) 0.11 % Reduction in Area: 0% % Reduction in Volume: 0% Epithelialization: None Tunneling: No Undermining: No Wound Description Classification: Category/Stage III Wound Margin: Epibole Exudate Amount: Large Exudate Type: Serosanguineous Exudate Color: red, brown Foul Odor After Cleansing: No Slough/Fibrino Yes Wound Bed Granulation Amount: Small (1-33%) Exposed Structure Granulation Quality: Pale Fascia Exposed: No Necrotic Amount: Large (67-100%) Fat Layer (Subcutaneous Tissue) Exposed: Yes Necrotic Quality: Adherent Slough Tendon Exposed: No Muscle Exposed: No Joint Exposed: No Bone Exposed: No Electronic Signature(s) DAMYIA, STRIDER A. ([id number]) Signed: 03/03/2019 10:25:42 AM By: Lenda Kelp PA-C Signed: 03/03/2019 4:37:29 PM By: Arnette Norris Entered By: Lenda Kelp on 03/03/2019 10:25:41 [name], [name] ([id number]) -------------------------------------------------------------------------------- Wound Assessment Details Patient Name: [name] Lye A. Date of Service: 03/03/2019 9:45 AM Medical Record Number: [id number] Patient Account Number: [id number] Date of Birth/Sex: 1929-01-09 (85 y.o. F) Treating RN: Arnette Norris Primary Care Dylann Gallier: Dewaine Oats Other Clinician: Referring Chieko Neises: Dewaine Oats Treating Javione Gunawan/Extender: STONE  III, HOYT Weeks in Treatment: 0 Wound Status Wound Number: 2 Primary Pressure Ulcer Etiology: Wound Location: Right Malleolus - Medial Wound Status: Open Wounding Event: Pressure Injury Comorbid Glaucoma, Hypertension, Osteoarthritis, Date Acquired: 07/18/2018 History: Dementia Weeks Of Treatment: 0 Clustered Wound: No Photos Wound Measurements Length: (cm) 1.5 % Reduction Width: (cm) 2.5 % Reduction Depth: (cm) 0.5 Epitheliali Area: (cm) 2.945 Tunneling: Volume: (cm) 1.473 Underminin in Area: 0% in Volume: 0% zation: None No g: No Wound Description Classification: Category/Stage III Foul  Odor Wound Margin: Epibole Slough/Fib Exudate Amount: Large Exudate Type: Serosanguineous Exudate Color: red, brown After Cleansing: No rino Yes Wound Bed Granulation Amount: None Present (0%) Exposed Structure Necrotic Amount: Small (1-33%) Fascia Exposed: No Necrotic Quality: Adherent Slough Fat Layer (Subcutaneous Tissue) Exposed: Yes Tendon Exposed: No Muscle Exposed: No Joint Exposed: No Bone Exposed: No Treatment Notes [name], [name] ([id number]) Wound #2 (Right, Medial Malleolus) Notes Prism. BFD Electronic Signature(s) Signed: 03/03/2019 4:37:29 PM By: Harold Barban Signed: 03/03/2019 5:55:45 PM By: Gretta Cool, BSN, RN, CWS, Kim RN, BSN Entered By: Gretta Cool, BSN, RN, CWS, Kim on 03/03/2019 10:44:21 [name] [name] ([id number]) -------------------------------------------------------------------------------- Wound Assessment Details Patient Name: [name] Roan A. Date of Service: 03/03/2019 9:45 AM Medical Record Number: [id number] Patient Account Number: [id number] Date of Birth/Sex: 04-11-1929 (85 y.o. F) Treating RN: Harold Barban Primary Care Ifeoluwa Beller: Benita Stabile Other Clinician: Referring Tayloranne Lekas: Benita Stabile Treating Marvel Mcphillips/Extender: STONE III, HOYT Weeks in Treatment: 0 Wound Status Wound Number: 3 Primary Pressure Ulcer Etiology: Wound Location: Left  Ischium Wound Status: Open Wounding Event: Pressure Injury Comorbid Glaucoma, Hypertension, Osteoarthritis, Date Acquired: 07/18/2018 History: Dementia Weeks Of Treatment: 0 Clustered Wound: No Photos Wound Measurements Length: (cm) 3.5 % Reductio Width: (cm) 4.2 % Reductio Depth: (cm) 2.2 Epithelial Area: (cm) 11.545 Tunneling Volume: (cm) 25.4 Undermini Startin Ending Maximum n in Area: 0% n in Volume: 0% ization: None : No ng: Yes g Position (o'clock): 4 Position (o'clock): 11 Distance: (cm) 2.4 Wound Description Classification: Category/Stage IV Foul Odor Wound Margin: Thickened Slough/Fi Exudate Amount: Large Exudate Type: Serosanguineous Exudate Color: red, brown After Cleansing: No brino Yes Wound Bed Granulation Amount: None Present (0%) Exposed Structure Necrotic Amount: Large (67-100%) Fascia Exposed: No Necrotic Quality: Eschar, Adherent Slough Fat Layer (Subcutaneous Tissue) Exposed: Yes Tendon Exposed: No Muscle Exposed: Yes Necrosis of Muscle: No [name], [name] ([id number]) Joint Exposed: No Bone Exposed: No Treatment Notes Wound #3 (Left Ischium) Notes Dakins soaked gauze, BFD Electronic Signature(s) Signed: 03/03/2019 4:37:29 PM By: Harold Barban Signed: 03/03/2019 5:55:45 PM By: Gretta Cool, BSN, RN, CWS, Kim RN, BSN Entered By: Gretta Cool, BSN, RN, CWS, Kim on 03/03/2019 10:44:43 [name] Roan A. ([id number]) -------------------------------------------------------------------------------- Wound Assessment Details Patient Name: [name] Roan A. Date of Service: 03/03/2019 9:45 AM Medical Record Number: [id number] Patient Account Number: [id number] Date of Birth/Sex: 1929/01/11 (85 y.o. F) Treating RN: Harold Barban Primary Care Lejuan Botto: Benita Stabile Other Clinician: Referring Mikael Skoda: Benita Stabile Treating Illiana Losurdo/Extender: STONE III, HOYT Weeks in Treatment: 0 Wound Status Wound Number: 4 Primary Pressure Ulcer Etiology: Wound Location: Sacrum -  Midline Wound Status: Open Wounding Event: Pressure Injury Comorbid Glaucoma, Hypertension, Osteoarthritis, Date Acquired: 07/18/2018 History: Dementia Weeks Of Treatment: 0 Clustered Wound: Yes Photos Wound Measurements Length: (cm) 5.5 % Reducti Width: (cm) 4 % Reducti Depth: (cm) 0.1 Epithelia Clustered Quantity: 3 Tunneling Area: (cm) 17.279 Undermin Volume: (cm) 1.728 on in Area: 0% on in Volume: 0% lization: Medium (34-66%) : No ing: No Wound Description Classification: Category/Stage III Wound Margin: Indistinct, nonvisible Exudate Amount: Medium Exudate Type: Serous Exudate Color: amber Foul Odor After Cleansing: No Slough/Fibrino Yes Wound Bed Granulation Amount: Medium (34-66%) Exposed Structure Granulation Quality: Pink Fascia Exposed: No Necrotic Amount: Medium (34-66%) Fat Layer (Subcutaneous Tissue) Exposed: No Necrotic Quality: Adherent Slough Tendon Exposed: No Muscle Exposed: No Joint Exposed: No Bone Exposed: No Limited to Skin Breakdown [name], [name] ([id number]) Treatment Notes Wound #4 (Midline Sacrum) Notes Zinc oxide to surface area Electronic Signature(s) Signed: 03/03/2019 4:37:29 PM By: Harold Barban Signed: 03/03/2019  5:55:45 PM By: Elliot GurneyWoody, BSN, RN, CWS, Kim RN, BSN Previous Signature: 03/03/2019 10:25:30 AM Version By: Lenda KelpStone III, Hoyt PA-C Entered By: Elliot GurneyWoody, BSN, RN, CWS, Kim on 03/03/2019 10:46:26 [name] AyeHALL, [name] IshiharaWILMA A. ([id number]) -------------------------------------------------------------------------------- Vitals Details Patient Name: [name] LyeHALL, Derrick A. Date of Service: 03/03/2019 9:45 AM Medical Record Number: [id number] Patient Account Number: [id number] Date of Birth/Sex: 10/11/1928 (85 y.o. F) Treating RN: Arnette NorrisBiell, Kristina Primary Care Edmond Ginsberg: Dewaine OatsATE, DENNY Other Clinician: Referring Lynesha Bango: TATE, Katherina RightENNY Treating Mansa Willers/Extender: STONE III, HOYT Weeks in Treatment: 0 Vital Signs Time Taken: 09:30 Temperature (F):  98.5 Height (in): 67 Pulse (bpm): 81 Source: Stated Respiratory Rate (breaths/min): 16 Weight (lbs): 140 Blood Pressure (mmHg): 119/62 Source: Stated Reference Range: 80 - 120 mg / dl Body Mass Index (BMI): 21.9 Electronic Signature(s) Signed: 03/03/2019 4:37:29 PM By: Arnette NorrisBiell, Kristina Entered By: Arnette NorrisBiell, Kristina on 03/03/2019 56:21:3009:38:24

## 2019-03-17 ENCOUNTER — Other Ambulatory Visit: Payer: Self-pay

## 2019-03-17 ENCOUNTER — Encounter: Payer: Medicare Other | Admitting: Internal Medicine

## 2019-03-17 DIAGNOSIS — L89224 Pressure ulcer of left hip, stage 4: Secondary | ICD-10-CM | POA: Diagnosis not present

## 2019-03-18 NOTE — Progress Notes (Signed)
[name], [name] ([id number]) Visit Report for 03/17/2019 Debridement Details Patient Name: [name] [name], [name] Date of Service: 03/17/2019 3:45 PM Medical Record Number: [id number] Patient Account Number: [id number] Date of Birth/Sex: [birth year] (85 y.o. F) Treating RN: Rodell Perna Primary Care Provider: Dewaine Oats Other Clinician: Referring Provider: Dewaine Oats Treating Provider/Extender: Altamese [place] in Treatment: 2 Debridement Performed for Wound #3 Left Ischium Assessment: Performed By: Physician Maxwell Caul, MD Debridement Type: Debridement Level of Consciousness (Pre- Awake and Alert procedure): Pre-procedure Verification/Time Yes - 16:33 Out Taken: Start Time: 16:33 Pain Control: Lidocaine Total Area Debrided (L x W): 3.1 (cm) x 3.9 (cm) = 12.09 (cm) Tissue and other material Viable, Non-Viable, Slough, Subcutaneous, Tendon, Slough debrided: Level: Skin/Subcutaneous Tissue/Muscle Debridement Description: Excisional Instrument: Curette Bleeding: Minimum Hemostasis Achieved: Pressure End Time: 16:34 Response to Treatment: Procedure was tolerated well Level of Consciousness Awake and Alert (Post-procedure): Post Debridement Measurements of Total Wound Length: (cm) 3.1 Stage: Category/Stage IV Width: (cm) 3.9 Depth: (cm) 2.1 Volume: (cm) 19.94 Character of Wound/Ulcer Post Stable Debridement: Post Procedure Diagnosis Same as Pre-procedure Electronic Signature(s) Signed: 03/17/2019 5:39:42 PM By: Baltazar Najjar MD Signed: 03/18/2019 9:12:39 AM By: Rodell Perna Entered By: Rodell Perna on 03/17/2019 16:34:11 Benefiel, Tana A. ([id number]) -------------------------------------------------------------------------------- HPI Details Patient Name: [name] Lye A. Date of Service: 03/17/2019 3:45 PM Medical Record Number: [id number] Patient Account Number: [id number] Date of Birth/Sex: 1929-03-29 (85 y.o. F) Treating RN: Rodell Perna Primary Care  Provider: Dewaine Oats Other Clinician: Referring Provider: Dewaine Oats Treating Provider/Extender: Altamese [place] in Treatment: 2 History of Present Illness HPI Description: 03/03/2019 on evaluation today patient presents for initial evaluation here in the clinic today concerning issues that she has been having with wounds which are pressure in nature at multiple locations currently. This is on her left heel, right ankle, left hip/ischial location, and all her to varying degrees of severity and depth. The worst is on the left ischial location. At this time the patient has been tolerating dressing changes although I am not exactly sure the dressings that have been utilized prior to coming in today. She does have some necrotic tissue noted at several locations this can require at least some degree of cleaning prior to application of dressings going forward. The patient does have dementia and unfortunately in recent months has become increasingly immobile requiring more significant treatment she was just recently moved roughly 2 weeks ago from assisted living to a skilled nursing facility at [facility]. Based on what I am seeing currently these pressure injuries occurred prior to that 2-week time I do not see any new or obvious pressure injury at this point. I discussed with the patient as well as her family member that this does appear to be doing better in my opinion than likely where things started. They are unsure as to whether or not she has an air mattress at the facility I do think that would be something that would be appropriate and good for her to have but at the moment I do not know for sure whether that is already in place if not that definitely is can be 1 of the recommendations. Also think a Roho cushion for her wheelchair would be good as well as Prevalon offloading boots. The patient currently does not have any severe pain although when I was cleaning some of the areas  she did note there was some discomfort at several of the locations. She does have a history of hypertension as well.  10/29; [facility] a [facility]. She is nonambulatory. She is here for follow-up on 4 different wounds 1 over the left lateral malleolus, the right medial calcaneus, the left posterior hip and I believe a new area over the sacrum. The patient is nonambulatory but apparently eats well. Electronic Signature(s) Signed: 03/17/2019 5:39:42 PM By: Baltazar Najjarobson, Michael MD Entered By: Baltazar Najjarobson, Michael on 03/17/2019 17:33:34 Margo AyeHALL, Marylyn IshiharaWILMA A. ([id number]) -------------------------------------------------------------------------------- Physical Exam Details Patient Name: [name] LyeHALL, [name] Date of Service: 03/17/2019 3:45 PM Medical Record Number: [id number] Patient Account Number: [id number] Date of Birth/Sex: 05/01/1929 (85 y.o. F) Treating RN: Rodell PernaScott, Dajea Primary Care Provider: Dewaine OatsATE, DENNY Other Clinician: Referring Provider: TATE, Katherina RightENNY Treating Provider/Extender: Maxwell CaulOBSON, MICHAEL G Weeks in Treatment: 2 Constitutional Sitting or standing Blood Pressure is within target range for patient.. Pulse regular and within target range for patient.Marland Kitchen. Respirations regular, non-labored and within target range.. Temperature is normal and within the target range for the patient.Marland Kitchen. appears in no distress. Notes Wound exam; the patient has a wound on her left heel and also the right lateral malleolus. Electronic Signature(s) Signed: 03/17/2019 5:39:42 PM By: Baltazar Najjarobson, Michael MD Entered By: Baltazar Najjarobson, Michael on 03/17/2019 17:31:45 Frederick PeersHALL, Elcie A. ([id number]) -------------------------------------------------------------------------------- Physician Orders Details Patient Name: [name] LyeHALL, [name] Date of Service: 03/17/2019 3:45 PM Medical Record Number: [id number] Patient Account Number: [id number] Date of Birth/Sex: 12/04/1928 (85 y.o. F) Treating RN: Rodell PernaScott, Dajea Primary Care Provider:  Dewaine OatsATE, DENNY Other Clinician: Referring Provider: Dewaine OatsATE, DENNY Treating Provider/Extender: Altamese CarolinaOBSON, MICHAEL G Weeks in Treatment: 2 Verbal / Phone Orders: No Diagnosis Coding Wound Cleansing Wound #1 Left,Lateral Malleolus o Clean wound with Normal Saline. o Cleanse wound with mild soap and water Wound #2 Right,Medial Calcaneus o Clean wound with Normal Saline. o Cleanse wound with mild soap and water Wound #3 Left Ischium o Clean wound with Normal Saline. o Cleanse wound with mild soap and water Wound #4 Midline Sacrum o Clean wound with Normal Saline. o Cleanse wound with mild soap and water Anesthetic (add to Medication List) Wound #1 Left,Lateral Malleolus o Topical Lidocaine 4% cream applied to wound bed prior to debridement (In Clinic Only). Wound #2 Right,Medial Calcaneus o Topical Lidocaine 4% cream applied to wound bed prior to debridement (In Clinic Only). Wound #3 Left Ischium o Topical Lidocaine 4% cream applied to wound bed prior to debridement (In Clinic Only). Wound #4 Midline Sacrum o Topical Lidocaine 4% cream applied to wound bed prior to debridement (In Clinic Only). Primary Wound Dressing Wound #1 Left,Lateral Malleolus o Silver Collagen Wound #2 Right,Medial Calcaneus o Silver Collagen Wound #3 Left Ischium o Silver Collagen Wound #4 Midline Sacrum o Silver Collagen Secondary Dressing Kimmey, Eilleen A. ([id number]) Wound #1 Left,Lateral Malleolus o Boardered Foam Dressing Wound #2 Right,Medial Calcaneus o Boardered Foam Dressing Wound #4 Midline Sacrum o Boardered Foam Dressing Wound #3 Left Ischium o Dry Gauze - over saline moistened gauze o Saline moistened gauze - over silver collagen o Boardered Foam Dressing Dressing Change Frequency Wound #1 Left,Lateral Malleolus o Change dressing every day. Wound #2 Right,Medial Calcaneus o Change dressing every day. Wound #3 Left Ischium o Change dressing  every day. Wound #4 Midline Sacrum o Change dressing every day. Follow-up Appointments Wound #1 Left,Lateral Malleolus o Return Appointment in 1 week. Wound #2 Right,Medial Calcaneus o Return Appointment in 1 week. Wound #3 Left Ischium o Return Appointment in 1 week. Wound #4 Midline Sacrum o Return Appointment in 1 week. Off-Loading Wound #1 Left,Lateral Malleolus o Roho  cushion for wheelchair o Mattress - Patient needs air matress o Turn and reposition every 2 hours o Other: - Offloading boots for bilateral feet Wound #2 Right,Medial Calcaneus o Roho cushion for wheelchair o Mattress - Patient needs air matress o Turn and reposition every 2 hours o Other: - Offloading boots for bilateral feet Wound #3 Left Ischium o Roho cushion for wheelchair o Mattress - Patient needs air matress Holsonback, Cosima A. ([id number]) o Turn and reposition every 2 hours o Other: - Offloading boots for bilateral feet Wound #4 Midline Sacrum o Roho cushion for wheelchair o Mattress - Patient needs air matress o Turn and reposition every 2 hours o Other: - Offloading boots for bilateral feet Electronic Signature(s) Signed: 03/17/2019 5:39:42 PM By: Baltazar Najjar MD Signed: 03/18/2019 9:12:39 AM By: Rodell Perna Entered By: Rodell Perna on 03/17/2019 16:42:12 Bebout, Ala A. ([id number]) -------------------------------------------------------------------------------- Problem List Details Patient Name: [name] Lye A. Date of Service: 03/17/2019 3:45 PM Medical Record Number: [id number] Patient Account Number: [id number] Date of Birth/Sex: 1929-03-06 (85 y.o. F) Treating RN: Rodell Perna Primary Care Provider: Dewaine Oats Other Clinician: Referring Provider: Dewaine Oats Treating Provider/Extender: Altamese [place] in Treatment: 2 Active Problems ICD-10 Evaluated Encounter Code Description Active Date Today Diagnosis L89.623 Pressure ulcer of left  heel, stage 3 03/03/2019 No Yes L89.513 Pressure ulcer of right ankle, stage 3 03/03/2019 No Yes L89.224 Pressure ulcer of left hip, stage 4 03/03/2019 No Yes I10 Essential (primary) hypertension 03/03/2019 No Yes F01.50 Vascular dementia without behavioral disturbance 03/03/2019 No Yes Inactive Problems Resolved Problems Electronic Signature(s) Signed: 03/17/2019 5:39:42 PM By: Baltazar Najjar MD Entered By: Baltazar Najjar on 03/17/2019 17:27:38 Cecena, [name] ([id number]) -------------------------------------------------------------------------------- Progress Note Details Patient Name: [name] Lye A. Date of Service: 03/17/2019 3:45 PM Medical Record Number: [id number] Patient Account Number: [id number] Date of Birth/Sex: January 16, 1929 (85 y.o. F) Treating RN: Rodell Perna Primary Care Provider: Dewaine Oats Other Clinician: Referring Provider: Dewaine Oats Treating Provider/Extender: Maxwell Caul Weeks in Treatment: 2 Subjective History of Present Illness (HPI) 03/03/2019 on evaluation today patient presents for initial evaluation here in the clinic today concerning issues that she has been having with wounds which are pressure in nature at multiple locations currently. This is on her left heel, right ankle, left hip/ischial location, and all her to varying degrees of severity and depth. The worst is on the left ischial location. At this time the patient has been tolerating dressing changes although I am not exactly sure the dressings that have been utilized prior to coming in today. She does have some necrotic tissue noted at several locations this can require at least some degree of cleaning prior to application of dressings going forward. The patient does have dementia and unfortunately in recent months has become increasingly immobile requiring more significant treatment she was just recently moved roughly 2 weeks ago from assisted living to a skilled nursing facility at [facility]. Based on what I am seeing currently these pressure injuries occurred prior to that 2-week time I do not see any new or obvious pressure injury at this point. I discussed with the patient as well as her family member that this does appear to be doing better in my opinion than likely where things started. They are unsure as to whether or not she has an air mattress at the facility I do think that would be something that would be appropriate and good for her to have but at the moment I do not know  for sure whether that is already in place if not that definitely is can be 1 of the recommendations. Also think a Roho cushion for her wheelchair would be good as well as Prevalon offloading boots. The patient currently does not have any severe pain although when I was cleaning some of the areas she did note there was some discomfort at several of the locations. She does have a history of hypertension as well. 10/29; this is a patient from [facility] nursing home. She is nonambulatory. She is here for follow-up on 4 different wounds 1 over the left lateral malleolus, the right medial calcaneus, the left posterior hip and I believe a new area over the sacrum. The patient is nonambulatory but apparently eats well. Objective Constitutional Sitting or standing Blood Pressure is within target range for patient.. Pulse regular and within target range for patient.Marland Kitchen Respirations regular, non-labored and within target range.. Temperature is normal and within the target range for the patient.Marland Kitchen appears in no distress. Vitals Time Taken: 3:55 PM, Height: 67 in, Weight: 140 lbs, BMI: 21.9, Temperature: 99.1 F, Pulse: 62 bpm, Respiratory Rate: 16 breaths/min, Blood Pressure: 121/64 mmHg. General Notes: Wound exam; the patient has a wound on her left heel and also the right lateral malleolus. Integumentary (Hair, Skin) Wound #1 status is Open. Original cause of wound was Pressure Injury. The wound is  located on the Left,Lateral Malleolus. The wound measures 0.6cm length x 0.5cm width x 0.4cm depth; 0.236cm^2 area and 0.094cm^3 volume. There is Fat Layer (Subcutaneous Tissue) Exposed exposed. There is no tunneling or undermining noted. There is a large amount of serosanguineous drainage noted. The wound margin is epibole. There is small (1-33%) pale granulation within the wound Dauphinais, Carena A. ([id number]) bed. There is a large (67-100%) amount of necrotic tissue within the wound bed including Adherent Slough. Wound #2 status is Open. Original cause of wound was Pressure Injury. The wound is located on the Right,Medial Calcaneus. The wound measures 0.7cm length x 1.2cm width x 0.1cm depth; 0.66cm^2 area and 0.066cm^3 volume. There is Fat Layer (Subcutaneous Tissue) Exposed exposed. There is no tunneling or undermining noted. There is a large amount of serosanguineous drainage noted. The wound margin is epibole. There is no granulation within the wound bed. There is a small (1-33%) amount of necrotic tissue within the wound bed including Adherent Slough. Wound #3 status is Open. Original cause of wound was Pressure Injury. The wound is located on the Left Ischium. The wound measures 3.1cm length x 3.9cm width x 2.1cm depth; 9.495cm^2 area and 19.94cm^3 volume. There is muscle and Fat Layer (Subcutaneous Tissue) Exposed exposed. There is no tunneling noted, however, there is undermining starting at 6:00 and ending at 11:00 with a maximum distance of 2cm. There is a large amount of serosanguineous drainage noted. The wound margin is thickened. There is no granulation within the wound bed. There is a large (67-100%) amount of necrotic tissue within the wound bed including Eschar and Adherent Slough. Wound #4 status is Open. Original cause of wound was Pressure Injury. The wound is located on the Midline Sacrum. The wound measures 1.1cm length x 0.7cm width x 0.2cm depth; 0.605cm^2 area and 0.121cm^3  volume. The wound is limited to skin breakdown. There is no tunneling or undermining noted. There is a medium amount of serous drainage noted. The wound margin is indistinct and nonvisible. There is medium (34-66%) pink granulation within the wound bed. There is a medium (34-66%) amount of necrotic tissue within  the wound bed including Adherent Slough. Assessment Active Problems ICD-10 Pressure ulcer of left heel, stage 3 Pressure ulcer of right ankle, stage 3 Pressure ulcer of left hip, stage 4 Essential (primary) hypertension Vascular dementia without behavioral disturbance Procedures Wound #3 Pre-procedure diagnosis of Wound #3 is a Pressure Ulcer located on the Left Ischium . There was a Excisional Skin/Subcutaneous Tissue/Muscle Debridement with a total area of 12.09 sq cm performed by Maxwell Caul, MD. With the following instrument(s): Curette to remove Viable and Non-Viable tissue/material. Material removed includes Tendon, Subcutaneous Tissue, and Slough after achieving pain control using Lidocaine. A time out was conducted at 16:33, prior to the start of the procedure. A Minimum amount of bleeding was controlled with Pressure. The procedure was tolerated well. Post Debridement Measurements: 3.1cm length x 3.9cm width x 2.1cm depth; 19.94cm^3 volume. Post debridement Stage noted as Category/Stage IV. Character of Wound/Ulcer Post Debridement is stable. Post procedure Diagnosis Wound #3: Same as Pre-Procedure Connole, Maimuna A. ([id number]) Plan Wound Cleansing: Wound #1 Left,Lateral Malleolus: Clean wound with Normal Saline. Cleanse wound with mild soap and water Wound #2 Right,Medial Calcaneus: Clean wound with Normal Saline. Cleanse wound with mild soap and water Wound #3 Left Ischium: Clean wound with Normal Saline. Cleanse wound with mild soap and water Wound #4 Midline Sacrum: Clean wound with Normal Saline. Cleanse wound with mild soap and water Anesthetic (add to  Medication List): Wound #1 Left,Lateral Malleolus: Topical Lidocaine 4% cream applied to wound bed prior to debridement (In Clinic Only). Wound #2 Right,Medial Calcaneus: Topical Lidocaine 4% cream applied to wound bed prior to debridement (In Clinic Only). Wound #3 Left Ischium: Topical Lidocaine 4% cream applied to wound bed prior to debridement (In Clinic Only). Wound #4 Midline Sacrum: Topical Lidocaine 4% cream applied to wound bed prior to debridement (In Clinic Only). Primary Wound Dressing: Wound #1 Left,Lateral Malleolus: Silver Collagen Wound #2 Right,Medial Calcaneus: Silver Collagen Wound #3 Left Ischium: Silver Collagen Wound #4 Midline Sacrum: Silver Collagen Secondary Dressing: Wound #1 Left,Lateral Malleolus: Boardered Foam Dressing Wound #2 Right,Medial Calcaneus: Boardered Foam Dressing Wound #4 Midline Sacrum: Boardered Foam Dressing Wound #3 Left Ischium: Dry Gauze - over saline moistened gauze Saline moistened gauze - over silver collagen Boardered Foam Dressing Dressing Change Frequency: Wound #1 Left,Lateral Malleolus: Change dressing every day. Wound #2 Right,Medial Calcaneus: Change dressing every day. Wound #3 Left Ischium: Change dressing every day. Wound #4 Midline Sacrum: Change dressing every day. Follow-up Appointments: Wound #1 Left,Lateral Malleolus: Return Appointment in 1 week. Wound #2 Right,Medial Calcaneus: Return Appointment in 1 week. AMAZIN, PINCOCK A. ([id number]) Wound #3 Left Ischium: Return Appointment in 1 week. Wound #4 Midline Sacrum: Return Appointment in 1 week. Off-Loading: Wound #1 Left,Lateral Malleolus: Roho cushion for wheelchair Mattress - Patient needs air matress Turn and reposition every 2 hours Other: - Offloading boots for bilateral feet Wound #2 Right,Medial Calcaneus: Roho cushion for wheelchair Mattress - Patient needs air matress Turn and reposition every 2 hours Other: - Offloading boots for  bilateral feet Wound #3 Left Ischium: Roho cushion for wheelchair Mattress - Patient needs air matress Turn and reposition every 2 hours Other: - Offloading boots for bilateral feet Wound #4 Midline Sacrum: Roho cushion for wheelchair Mattress - Patient needs air matress Turn and reposition every 2 hours Other: - Offloading boots for bilateral feet #1 the areas on the left ankle and right heel look reasonably healthy and small. Although I do not feel her pulses it looks as though these  are improving. We are using silver collagen on these wounds 2. The area on the left posterior hip is a deep difficult wound with very significant undermining from roughly 7-12 o'clock. There was nonviable tendon at the base of this as well as some necrotic debris. This was all removed with a #5 curette post debridement this actually looked reasonably healthy. We will moved to silver collagen packed with normal saline wet-to-dry. 3. New area of the sacrum. This also required debridement with a #3 curette we use normal saline on this area to small punched out wound 4. I think the area on the sacrum was a new wound this week. I would be worrisome for any hope of healing these if we are not offloading the areas properly Electronic Signature(s) Signed: 03/17/2019 5:39:42 PM By: Linton Ham MD Entered By: Linton Ham on 03/17/2019 17:36:06 Herrera, Darryll Capers A. ([id number]) -------------------------------------------------------------------------------- SuperBill Details Patient Name: [name] Roan A. Date of Service: 03/17/2019 Medical Record Number: [id number] Patient Account Number: [id number] Date of Birth/Sex: 1928-10-22 (85 y.o. F) Treating RN: Army Melia Primary Care Provider: Benita Stabile Other Clinician: Referring Provider: Benita Stabile Treating Provider/Extender: Tito Dine in Treatment: 2 Diagnosis Coding ICD-10 Codes Code Description [phone] Pressure ulcer of left heel, stage  3 L89.513 Pressure ulcer of right ankle, stage 3 L89.224 Pressure ulcer of left hip, stage 4 I10 Essential (primary) hypertension F01.50 Vascular dementia without behavioral disturbance Facility Procedures CPT4 Code: [id number] Description: 41962 - DEB MUSC/FASCIA 20 SQ CM/< ICD-10 Diagnosis Description L89.224 Pressure ulcer of left hip, stage 4 Modifier: Quantity: 1 Physician Procedures CPT4 Code: [id number] Description: 11043 - WC PHYS DEBR MUSCLE/FASCIA 20 SQ CM ICD-10 Diagnosis Description L89.224 Pressure ulcer of left hip, stage 4 Modifier: Quantity: 1 Electronic Signature(s) Signed: 03/17/2019 5:39:42 PM By: Linton Ham MD Entered By: Linton Ham on 03/17/2019 17:36:27

## 2019-03-18 NOTE — Progress Notes (Signed)
ADELHEID, HOGGARD ([id number]) Visit Report for 03/17/2019 Arrival Information Details Patient Name: [name] [name], [name] Date of Service: 03/17/2019 3:45 PM Medical Record Number: [id number] Patient Account Number: [id number] Date of Birth/Sex: June 22, 1928 (85 y.o. F) Treating RN: Rodell Perna Primary Care Emoni Whitworth: Dewaine Oats Other Clinician: Referring Xavi Tomasik: Dewaine Oats Treating Jolon Degante/Extender: Altamese [place] in Treatment: 2 Visit Information History Since Last Visit Added or deleted any medications: No Patient Arrived: Wheel Chair Any new allergies or adverse reactions: No Arrival Time: 15:55 Had a fall or experienced change in No Accompanied By: son activities of daily living that may affect Transfer Assistance: Manual risk of falls: Patient Identification Verified: Yes Signs or symptoms of abuse/neglect since last visito No Secondary Verification Process Completed: Yes Hospitalized since last visit: No Implantable device outside of the clinic excluding No cellular tissue based products placed in the center since last visit: Has Dressing in Place as Prescribed: Yes Pain Present Now: No Electronic Signature(s) Signed: 03/17/2019 4:55:41 PM By: Dayton Martes RCP, RRT, CHT Entered By: Dayton Martes on 03/17/2019 15:55:57 Mikkelsen, [name] ([id number]) -------------------------------------------------------------------------------- Encounter Discharge Information Details Patient Name: [name] Lye A. Date of Service: 03/17/2019 3:45 PM Medical Record Number: [id number] Patient Account Number: [id number] Date of Birth/Sex: [birth year] (85 y.o. F) Treating RN: Rodell Perna Primary Care Shelby Anderle: Dewaine Oats Other Clinician: Referring [place] Sigmon: Dewaine Oats Treating Gudrun Axe/Extender: Altamese [place] in Treatment: 2 Encounter Discharge Information Items Post Procedure Vitals Discharge Condition: Stable Temperature (F): 99.1 Ambulatory  Status: Wheelchair Pulse (bpm): 62 Discharge Destination: Skilled Nursing Facility Respiratory Rate (breaths/min): 16 Telephoned: No Blood Pressure (mmHg): 121/64 Orders Sent: Yes Transportation: Private Auto Accompanied By: spouse Schedule Follow-up Appointment: Yes Clinical Summary of Care: Electronic Signature(s) Signed: 03/18/2019 9:12:39 AM By: Rodell Perna Entered By: Rodell Perna on 03/17/2019 16:43:47 Mcjunkins, Katura A. ([id number]) -------------------------------------------------------------------------------- Lower Extremity Assessment Details Patient Name: [name] Lye A. Date of Service: 03/17/2019 3:45 PM Medical Record Number: [id number] Patient Account Number: [id number] Date of Birth/Sex: 05-09-1929 (85 y.o. F) Treating RN: Arnette Norris Primary Care Judah Carchi: Dewaine Oats Other Clinician: Referring Colbi Schiltz: Dewaine Oats Treating Fredrica Capano/Extender: Maxwell Caul Weeks in Treatment: 2 Vascular Assessment Pulses: Dorsalis Pedis Palpable: [Left:Yes] [Right:Yes] Posterior Tibial Palpable: [Left:Yes] [Right:Yes] Electronic Signature(s) Signed: 03/17/2019 5:01:15 PM By: Arnette Norris Entered By: Arnette Norris on 03/17/2019 16:16:32 Stolar, [name] ([id number]) -------------------------------------------------------------------------------- Multi Wound Chart Details Patient Name: [name] Lye A. Date of Service: 03/17/2019 3:45 PM Medical Record Number: [id number] Patient Account Number: [id number] Date of Birth/Sex: Nov 28, 1928 (85 y.o. F) Treating RN: Rodell Perna Primary Care Dorella Laster: Dewaine Oats Other Clinician: Referring Lyndell Gillyard: Dewaine Oats Treating Orange Hilligoss/Extender: Maxwell Caul Weeks in Treatment: 2 Vital Signs Height(in): 67 Pulse(bpm): 62 Weight(lbs): 140 Blood Pressure(mmHg): 121/64 Body Mass Index(BMI): 22 Temperature(F): 99.1 Respiratory Rate 16 (breaths/min): Photos: Wound Location: Left Malleolus - Lateral Right Calcaneus - Medial  Left Ischium Wounding Event: Pressure Injury Pressure Injury Pressure Injury Primary Etiology: Pressure Ulcer Pressure Ulcer Pressure Ulcer Comorbid History: Glaucoma, Hypertension, Glaucoma, Hypertension, Glaucoma, Hypertension, Osteoarthritis, Dementia Osteoarthritis, Dementia Osteoarthritis, Dementia Date Acquired: 07/18/2018 07/18/2018 07/18/2018 Weeks of Treatment: Wound Status: Open Open Open Clustered Wound: No No No Clustered Quantity: N/A N/A N/A Measurements L x W x D 0.6x0.5x0.4 0.7x1.2x0.1 3.1x3.9x2.1 (cm) Area (cm) : 0.236 0.66 9.495 Volume (cm) : 0.094 0.066 19.94 % Reduction in Area: 57.10% 77.60% 17.80% % Reduction in Volume: 14.50% 95.50% 21.50% Starting Position 1 6 (o'clock): Ending Position 1 11 (o'clock): Maximum Distance 1 (cm):  3 Undermining: N/A No Yes Classification: Category/Stage III Category/Stage III Category/Stage IV Exudate Amount: Large Large Large Exudate Type: Serosanguineous Serosanguineous Serosanguineous Exudate Color: red, brown red, brown red, brown Wound Margin: Epibole Epibole Thickened Granulation Amount: Small (1-33%) None Present (0%) None Present (0%) Granulation Quality: Pale N/A N/A Schurman, [name] ([id number]) Necrotic Amount: Large (67-100%) Small (1-33%) Large (67-100%) Necrotic Tissue: Adherent Slough Adherent Slough Eschar, Adherent Slough Exposed Structures: Fat Layer (Subcutaneous Fat Layer (Subcutaneous Fat Layer (Subcutaneous Tissue) Exposed: Yes Tissue) Exposed: Yes Tissue) Exposed: Yes Fascia: No Fascia: No Muscle: Yes Tendon: No Tendon: No Fascia: No Muscle: No Muscle: No Tendon: No Joint: No Joint: No Joint: No Bone: No Bone: No Bone: No Epithelialization: None None None Wound Number: 4 N/A N/A Photos: N/A N/A Wound Location: Sacrum - Midline N/A N/A Wounding Event: Pressure Injury N/A N/A Primary Etiology: Pressure Ulcer N/A N/A Comorbid History: Glaucoma, Hypertension, N/A N/A Osteoarthritis,  Dementia Date Acquired: 07/18/2018 N/A N/A Weeks of Treatment: 2 N/A N/A Wound Status: Open N/A N/A Clustered Wound: Yes N/A N/A Clustered Quantity: 3 N/A N/A Measurements L x W x D 1.1x0.7x0.2 N/A N/A (cm) Area (cm) : 0.605 N/A N/A Volume (cm) : 0.121 N/A N/A % Reduction in Area: 96.50% N/A N/A % Reduction in Volume: 93.00% N/A N/A Undermining: No N/A N/A Classification: Category/Stage III N/A N/A Exudate Amount: Medium N/A N/A Exudate Type: Serous N/A N/A Exudate Color: amber N/A N/A Wound Margin: Indistinct, nonvisible N/A N/A Granulation Amount: Medium (34-66%) N/A N/A Granulation Quality: Pink N/A N/A Necrotic Amount: Medium (34-66%) N/A N/A Necrotic Tissue: Adherent Slough N/A N/A Exposed Structures: Fascia: No N/A N/A Fat Layer (Subcutaneous Tissue) Exposed: No Tendon: No Muscle: No Joint: No Bone: No Limited to Skin Breakdown Epithelialization: Medium (34-66%) N/A N/A Treatment Notes [name] LyeHALL, [name] ([id number]) Electronic Signature(s) Signed: 03/18/2019 9:12:39 AM By: Rodell PernaScott, Dajea Entered By: Rodell PernaScott, Dajea on 03/17/2019 16:31:36 Kawahara, [name] IshiharaWILMA A. ([id number]) -------------------------------------------------------------------------------- Multi-Disciplinary Care Plan Details Patient Name: [name] LyeHALL, Tanee A. Date of Service: 03/17/2019 3:45 PM Medical Record Number: [id number] Patient Account Number: [id number] Date of Birth/Sex: 10/06/1928 (85 y.o. F) Treating RN: Rodell PernaScott, Dajea Primary Care Alexiz Sustaita: Dewaine OatsATE, DENNY Other Clinician: Referring Jahmeir Geisen: Dewaine OatsATE, DENNY Treating Marsella Suman/Extender: Altamese CarolinaOBSON, MICHAEL G Weeks in Treatment: 2 Active Inactive Abuse / Safety / Falls / Self Care Management Nursing Diagnoses: Potential for falls Goals: Patient will remain injury free related to falls Date Initiated: 03/03/2019 Target Resolution Date: 03/16/2019 Goal Status: Active Interventions: Assess fall risk on admission and as needed Notes: Medication Nursing  Diagnoses: Knowledge deficit related to medication safety: actual or potential Goals: Patient/caregiver will demonstrate understanding of all current medications Date Initiated: 03/03/2019 Target Resolution Date: 03/16/2019 Goal Status: Active Interventions: Assess for medication contraindications each visit where new medications are prescribed Treatment Activities: New medication prescribed at Wound Center : 03/03/2019 Notes: Necrotic Tissue Nursing Diagnoses: Impaired tissue integrity related to necrotic/devitalized tissue Goals: Necrotic/devitalized tissue will be minimized in the wound bed Date Initiated: 03/03/2019 Target Resolution Date: 03/16/2019 Goal Status: Active [name] LyeHALL, [name] ([id number]) Interventions: Assess patient pain level pre-, during and post procedure and prior to discharge Treatment Activities: Apply topical anesthetic as ordered : 03/03/2019 Notes: Nutrition Nursing Diagnoses: Potential for alteratiion in Nutrition/Potential for imbalanced nutrition Goals: Patient/caregiver agrees to and verbalizes understanding of need to use nutritional supplements and/or vitamins as prescribed Date Initiated: 03/03/2019 Target Resolution Date: 03/16/2019 Goal Status: Active Interventions: Provide education on nutrition Notes: Orientation to the Wound Care Program Nursing Diagnoses: Knowledge deficit related  to the wound healing center program Goals: Patient/caregiver will verbalize understanding of the [place] Date Initiated: 03/03/2019 Target Resolution Date: 03/16/2019 Goal Status: Active Interventions: Provide education on orientation to the wound center Notes: Pressure Nursing Diagnoses: Knowledge deficit related to management of pressures ulcers Goals: Patient/caregiver will verbalize understanding of pressure ulcer management Date Initiated: 03/03/2019 Target Resolution Date: 03/16/2019 Goal Status:  Active Interventions: Assess: immobility, friction, shearing, incontinence upon admission and as needed Provide education on pressure ulcers Treatment Activities: Patient referred for home evaluation of offloading devices/mattresses : 03/03/2019 KYNSIE, FALKNER A. ([id number]) Patient referred for pressure reduction/relief devices : 03/03/2019 Pressure reduction/relief device ordered : 03/03/2019 Notes: Wound/Skin Impairment Nursing Diagnoses: Impaired tissue integrity Goals: Ulcer/skin breakdown will have a volume reduction of 30% by week 4 Date Initiated: 03/03/2019 Target Resolution Date: 03/03/2019 Goal Status: Active Interventions: Assess ulceration(s) every visit Treatment Activities: Skin care regimen initiated : 03/03/2019 Notes: Electronic Signature(s) Signed: 03/18/2019 9:12:39 AM By: Army Melia Entered By: Army Melia on 03/17/2019 16:31:27 Ciesla, Dejanay A. ([id number]) -------------------------------------------------------------------------------- Pain Assessment Details Patient Name: [name] Roan A. Date of Service: 03/17/2019 3:45 PM Medical Record Number: [id number] Patient Account Number: [id number] Date of Birth/Sex: September 08, 1928 (85 y.o. F) Treating RN: Army Melia Primary Care Viriginia Amendola: Benita Stabile Other Clinician: Referring [place] Valli: Benita Stabile Treating Reise Hietala/Extender: Ricard Dillon Weeks in Treatment: 2 Active Problems Location of Pain Severity and Description of Pain Patient Has Paino No Site Locations Pain Management and Medication Current Pain Management: Electronic Signature(s) Signed: 03/17/2019 4:55:41 PM By: Lorine Bears RCP, RRT, CHT Signed: 03/18/2019 9:12:39 AM By: Army Melia Entered By: Lorine Bears on 03/17/2019 15:56:04 [name] Roan A. ([id number]) -------------------------------------------------------------------------------- Patient/Caregiver Education Details Patient Name: [name] Roan A. Date of  Service: 03/17/2019 3:45 PM Medical Record Number: [id number] Patient Account Number: [id number] Date of Birth/Gender: 01/31/1929 (85 y.o. F) Treating RN: Army Melia Primary Care Physician: Benita Stabile Other Clinician: Referring Physician: Benita Stabile Treating Physician/Extender: Tito Dine in Treatment: 2 Education Assessment Education Provided To: Patient Education Topics Provided Wound/Skin Impairment: Handouts: Caring for Your Ulcer Methods: Demonstration, Explain/Verbal Responses: State content correctly Electronic Signature(s) Signed: 03/18/2019 9:12:39 AM By: Army Melia Entered By: Army Melia on 03/17/2019 16:42:31 Rogowski, [name] ([id number]) -------------------------------------------------------------------------------- Wound Assessment Details Patient Name: [name] Roan A. Date of Service: 03/17/2019 3:45 PM Medical Record Number: [id number] Patient Account Number: [id number] Date of Birth/Sex: [birth year] (85 y.o. F) Treating RN: Harold Barban Primary Care Blanche Scovell: Benita Stabile Other Clinician: Referring Nichlos Kunzler: Benita Stabile Treating Lokelani Lutes/Extender: Ricard Dillon Weeks in Treatment: 2 Wound Status Wound Number: 1 Primary Pressure Ulcer Etiology: Wound Location: Left Malleolus - Lateral Wound Status: Open Wounding Event: Pressure Injury Comorbid Glaucoma, Hypertension, Osteoarthritis, Date Acquired: 07/18/2018 History: Dementia Weeks Of Treatment: 2 Clustered Wound: No Photos Wound Measurements Length: (cm) 0.6 Width: (cm) 0.5 Depth: (cm) 0.4 Area: (cm) 0.236 Volume: (cm) 0.094 % Reduction in Area: 57.1% % Reduction in Volume: 14.5% Epithelialization: None Tunneling: No Undermining: No Wound Description Classification: Category/Stage III Foul Odor Wound Margin: Epibole Slough/Fi Exudate Amount: Large Exudate Type: Serosanguineous Exudate Color: red, brown After Cleansing: No brino Yes Wound Bed Granulation Amount: Small  (1-33%) Exposed Structure Granulation Quality: Pale Fascia Exposed: No Necrotic Amount: Large (67-100%) Fat Layer (Subcutaneous Tissue) Exposed: Yes Necrotic Quality: Adherent Slough Tendon Exposed: No Muscle Exposed: No Joint Exposed: No Bone Exposed: No Treatment Notes Lennartz, [name] ([id number]) Wound #1 (Left, Lateral Malleolus) Notes prisma to all wounds, wet to dry with BFD  to left ischium, BFD to all other wounds Electronic Signature(s) Signed: 03/17/2019 5:01:15 PM By: Arnette Norris Signed: 03/18/2019 9:12:39 AM By: Rodell Perna Entered By: Rodell Perna on 03/17/2019 16:36:01 Whelchel, [name] ([id number]) -------------------------------------------------------------------------------- Wound Assessment Details Patient Name: [name] Lye A. Date of Service: 03/17/2019 3:45 PM Medical Record Number: [id number] Patient Account Number: [id number] Date of Birth/Sex: 02-23-1929 (85 y.o. F) Treating RN: Arnette Norris Primary Care Terrye Dombrosky: Dewaine Oats Other Clinician: Referring Rondell Pardon: Dewaine Oats Treating Brian Kocourek/Extender: Maxwell Caul Weeks in Treatment: 2 Wound Status Wound Number: 2 Primary Pressure Ulcer Etiology: Wound Location: Right Calcaneus - Medial Wound Status: Open Wounding Event: Pressure Injury Comorbid Glaucoma, Hypertension, Osteoarthritis, Date Acquired: 07/18/2018 History: Dementia Weeks Of Treatment: 2 Clustered Wound: No Photos Wound Measurements Length: (cm) 0.7 % Reduction Width: (cm) 1.2 % Reduction Depth: (cm) 0.1 Epitheliali Area: (cm) 0.66 Tunneling: Volume: (cm) 0.066 Underminin in Area: 77.6% in Volume: 95.5% zation: None No g: No Wound Description Classification: Category/Stage III Foul Odor Wound Margin: Epibole Slough/Fib Exudate Amount: Large Exudate Type: Serosanguineous Exudate Color: red, brown After Cleansing: No rino Yes Wound Bed Granulation Amount: None Present (0%) Exposed Structure Necrotic Amount: Small  (1-33%) Fascia Exposed: No Necrotic Quality: Adherent Slough Fat Layer (Subcutaneous Tissue) Exposed: Yes Tendon Exposed: No Muscle Exposed: No Joint Exposed: No Bone Exposed: No Treatment Notes Higinbotham, Samayah A. ([id number]) Wound #2 (Right, Medial Calcaneus) Notes prisma to all wounds, wet to dry with BFD to left ischium, BFD to all other wounds Electronic Signature(s) Signed: 03/17/2019 5:01:15 PM By: Arnette Norris Entered By: Arnette Norris on 03/17/2019 16:14:02 Walsh, [name] ([id number]) -------------------------------------------------------------------------------- Wound Assessment Details Patient Name: [name] Lye A. Date of Service: 03/17/2019 3:45 PM Medical Record Number: [id number] Patient Account Number: [id number] Date of Birth/Sex: 02-02-1929 (85 y.o. F) Treating RN: Arnette Norris Primary Care Melanie Pellot: Dewaine Oats Other Clinician: Referring Jennea Rager: Dewaine Oats Treating Ruqayyah Lute/Extender: Maxwell Caul Weeks in Treatment: 2 Wound Status Wound Number: 3 Primary Pressure Ulcer Etiology: Wound Location: Left Ischium Wound Status: Open Wounding Event: Pressure Injury Comorbid Glaucoma, Hypertension, Osteoarthritis, Date Acquired: 07/18/2018 History: Dementia Weeks Of Treatment: 2 Clustered Wound: No Photos Wound Measurements Length: (cm) 3.1 % Reducti Width: (cm) 3.9 % Reducti Depth: (cm) 2.1 Epithelia Area: (cm) 9.495 Tunnelin Volume: (cm) 19.94 Undermin Starti Ending Maximu on in Area: 17.8% on in Volume: 21.5% lization: None g: No ing: Yes ng Position (o'clock): 6 Position (o'clock): 11 m Distance: (cm) 2 Wound Description Classification: Category/Stage IV Foul Odo Wound Margin: Thickened Slough/F Exudate Amount: Large Exudate Type: Serosanguineous Exudate Color: red, brown r After Cleansing: No ibrino Yes Wound Bed Granulation Amount: None Present (0%) Exposed Structure Necrotic Amount: Large (67-100%) Fascia Exposed:  No Necrotic Quality: Eschar, Adherent Slough Fat Layer (Subcutaneous Tissue) Exposed: Yes Tendon Exposed: No Muscle Exposed: Yes Necrosis of Muscle: No Delker, [name] ([id number]) Joint Exposed: No Bone Exposed: No Treatment Notes Wound #3 (Left Ischium) Notes prisma to all wounds, wet to dry with BFD to left ischium, BFD to all other wounds Electronic Signature(s) Signed: 03/17/2019 5:01:15 PM By: Arnette Norris Signed: 03/18/2019 9:12:39 AM By: Rodell Perna Entered By: Rodell Perna on 03/17/2019 16:36:24 Loomer, [name] ([id number]) -------------------------------------------------------------------------------- Wound Assessment Details Patient Name: [name] Lye A. Date of Service: 03/17/2019 3:45 PM Medical Record Number: [id number] Patient Account Number: [id number] Date of Birth/Sex: 1929-05-13 (85 y.o. F) Treating RN: Arnette Norris Primary Care Jaeliana Lococo: Dewaine Oats Other Clinician: Referring Jhaden Pizzuto: Dewaine Oats Treating Alizee Maple/Extender: Maxwell Caul Weeks in Treatment: 2  Wound Status Wound Number: 4 Primary Pressure Ulcer Etiology: Wound Location: Sacrum - Midline Wound Status: Open Wounding Event: Pressure Injury Comorbid Glaucoma, Hypertension, Osteoarthritis, Date Acquired: 07/18/2018 History: Dementia Weeks Of Treatment: 2 Clustered Wound: Yes Photos Wound Measurements Length: (cm) 1.1 % Reduc Width: (cm) 0.7 % Reduc Depth: (cm) 0.2 Epithel Clustered Quantity: 3 Tunneli Area: (cm) 0.605 Underm Volume: (cm) 0.121 tion in Area: 96.5% tion in Volume: 93% ialization: Medium (34-66%) ng: No ining: No Wound Description Classification: Category/Stage III Wound Margin: Indistinct, nonvisible Exudate Amount: Medium Exudate Type: Serous Exudate Color: amber Foul Odor After Cleansing: No Slough/Fibrino Yes Wound Bed Granulation Amount: Medium (34-66%) Exposed Structure Granulation Quality: Pink Fascia Exposed: No Necrotic Amount: Medium  (34-66%) Fat Layer (Subcutaneous Tissue) Exposed: No Necrotic Quality: Adherent Slough Tendon Exposed: No Muscle Exposed: No Joint Exposed: No Bone Exposed: No Limited to Skin Breakdown Abad, [name] ([id number]) Treatment Notes Wound #4 (Midline Sacrum) Notes prisma to all wounds, wet to dry with BFD to left ischium, BFD to all other wounds Electronic Signature(s) Signed: 03/17/2019 5:01:15 PM By: Arnette Norris Entered By: Arnette Norris on 03/17/2019 16:16:18 Almon, Marajade A. ([id number]) -------------------------------------------------------------------------------- Vitals Details Patient Name: [name] Lye A. Date of Service: 03/17/2019 3:45 PM Medical Record Number: [id number] Patient Account Number: [id number] Date of Birth/Sex: [birth year] (85 y.o. F) Treating RN: Rodell Perna Primary Care Keone Kamer: Dewaine Oats Other Clinician: Referring Padraic Marinos: TATE, Katherina Right Treating Orazio Weller/Extender: Altamese [place] in Treatment: 2 Vital Signs Time Taken: 15:55 Temperature (F): 99.1 Height (in): 67 Pulse (bpm): 62 Weight (lbs): 140 Respiratory Rate (breaths/min): 16 Body Mass Index (BMI): 21.9 Blood Pressure (mmHg): 121/64 Reference Range: 80 - 120 mg / dl Electronic Signature(s) Signed: 03/17/2019 4:55:41 PM By: Dayton Martes RCP, RRT, CHT Entered By: Dayton Martes on 03/17/2019 15:56:45

## 2019-03-24 ENCOUNTER — Encounter: Payer: Medicare Other | Attending: Physician Assistant | Admitting: Physician Assistant

## 2019-03-24 ENCOUNTER — Other Ambulatory Visit: Payer: Self-pay

## 2019-03-24 DIAGNOSIS — L89623 Pressure ulcer of left heel, stage 3: Secondary | ICD-10-CM | POA: Diagnosis not present

## 2019-03-24 DIAGNOSIS — L89153 Pressure ulcer of sacral region, stage 3: Secondary | ICD-10-CM | POA: Diagnosis not present

## 2019-03-24 DIAGNOSIS — I10 Essential (primary) hypertension: Secondary | ICD-10-CM | POA: Insufficient documentation

## 2019-03-24 DIAGNOSIS — F0150 Vascular dementia without behavioral disturbance: Secondary | ICD-10-CM | POA: Insufficient documentation

## 2019-03-24 DIAGNOSIS — L89513 Pressure ulcer of right ankle, stage 3: Secondary | ICD-10-CM | POA: Diagnosis not present

## 2019-03-24 DIAGNOSIS — L89224 Pressure ulcer of left hip, stage 4: Secondary | ICD-10-CM | POA: Diagnosis not present

## 2019-03-24 NOTE — Progress Notes (Addendum)
TAWONNA, ESQUER ([id number]) Visit Report for 03/24/2019 Chief Complaint Document Details Patient Name: [name] [name], [name] Date of Service: 03/24/2019 2:30 PM Medical Record Number: [id number] Patient Account Number: [id number] Date of Birth/Sex: January 16, 1929 (85 y.o. F) Treating RN: Rodell Perna Primary Care Provider: Dewaine Oats Other Clinician: Referring Provider: Dewaine Oats Treating Provider/Extender: Linwood Dibbles, HOYT Weeks in Treatment: 3 Information Obtained from: Patient Chief Complaint Multiple pressure ulcers Electronic Signature(s) Signed: 03/24/2019 2:35:17 PM By: Lenda Kelp PA-C Entered By: Lenda Kelp on 03/24/2019 14:35:17 Deeley, [name] ([id number]) -------------------------------------------------------------------------------- Debridement Details Patient Name: [name] Lye A. Date of Service: 03/24/2019 2:30 PM Medical Record Number: [id number] Patient Account Number: [id number] Date of Birth/Sex: [birth year] (85 y.o. F) Treating RN: Rodell Perna Primary Care Provider: Dewaine Oats Other Clinician: Referring Provider: Dewaine Oats Treating Provider/Extender: Linwood Dibbles, HOYT Weeks in Treatment: 3 Debridement Performed for Wound #4 Midline Sacrum Assessment: Performed By: Physician STONE III, HOYT E., PA-C Debridement Type: Debridement Level of Consciousness (Pre- Awake and Alert procedure): Pre-procedure Verification/Time Yes - 15:09 Out Taken: Start Time: 15:10 Pain Control: Lidocaine Total Area Debrided (L x W): 1 (cm) x 0.6 (cm) = 0.6 (cm) Tissue and other material Viable, Non-Viable, Slough, Subcutaneous, Slough debrided: Level: Skin/Subcutaneous Tissue Debridement Description: Excisional Instrument: Curette Bleeding: None End Time: 15:11 Response to Treatment: Procedure was tolerated well Level of Consciousness Awake and Alert (Post-procedure): Post Debridement Measurements of Total Wound Length: (cm) 1 Stage: Category/Stage III Width: (cm)  0.6 Depth: (cm) 0.1 Volume: (cm) 0.047 Character of Wound/Ulcer Post Stable Debridement: Post Procedure Diagnosis Same as Pre-procedure Electronic Signature(s) Signed: 03/24/2019 4:39:17 PM By: Rodell Perna Signed: 03/24/2019 6:06:55 PM By: Lenda Kelp PA-C Entered By: Rodell Perna on 03/24/2019 15:11:19 [name], [name] ([id number]) -------------------------------------------------------------------------------- Debridement Details Patient Name: [name] Lye A. Date of Service: 03/24/2019 2:30 PM Medical Record Number: [id number] Patient Account Number: [id number] Date of Birth/Sex: 06-15-1928 (85 y.o. F) Treating RN: Rodell Perna Primary Care Provider: Dewaine Oats Other Clinician: Referring Provider: Dewaine Oats Treating Provider/Extender: Linwood Dibbles, HOYT Weeks in Treatment: 3 Debridement Performed for Wound #3 Left Ischium Assessment: Performed By: Physician STONE III, HOYT E., PA-C Debridement Type: Debridement Level of Consciousness (Pre- Awake and Alert procedure): Pre-procedure Verification/Time Yes - 15:09 Out Taken: Start Time: 15:10 Pain Control: Lidocaine Total Area Debrided (L x W): 2.1 (cm) x 3.4 (cm) = 7.14 (cm) Tissue and other material Viable, Non-Viable, Slough, Subcutaneous, Slough debrided: Level: Skin/Subcutaneous Tissue Debridement Description: Excisional Instrument: Curette Bleeding: None End Time: 15:11 Response to Treatment: Procedure was tolerated well Level of Consciousness Awake and Alert (Post-procedure): Post Debridement Measurements of Total Wound Length: (cm) 2.1 Stage: Category/Stage IV Width: (cm) 3.4 Depth: (cm) 1.5 Volume: (cm) 8.412 Character of Wound/Ulcer Post Stable Debridement: Post Procedure Diagnosis Same as Pre-procedure Electronic Signature(s) Signed: 03/24/2019 4:39:17 PM By: Rodell Perna Signed: 03/24/2019 6:06:55 PM By: Lenda Kelp PA-C Entered By: Rodell Perna on 03/24/2019 15:11:57 Encalada, Sunday A.  ([id number]) -------------------------------------------------------------------------------- HPI Details Patient Name: [name] Lye A. Date of Service: 03/24/2019 2:30 PM Medical Record Number: [id number] Patient Account Number: [id number] Date of Birth/Sex: 03-29-1929 (85 y.o. F) Treating RN: Rodell Perna Primary Care Provider: Dewaine Oats Other Clinician: Referring Provider: Dewaine Oats Treating Provider/Extender: Linwood Dibbles, HOYT Weeks in Treatment: 3 History of Present Illness HPI Description: 03/03/2019 on evaluation today patient presents for initial evaluation here in the clinic today concerning issues that she has been having with wounds which are pressure in nature at multiple locations currently. This  is on her left heel, right ankle, left hip/ischial location, and all her to varying degrees of severity and depth. The worst is on the left ischial location. At this time the patient has been tolerating dressing changes although I am not exactly sure the dressings that have been utilized prior to coming in today. She does have some necrotic tissue noted at several locations this can require at least some degree of cleaning prior to application of dressings going forward. The patient does have dementia and unfortunately in recent months has become increasingly immobile requiring more significant treatment she was just recently moved roughly 2 weeks ago from assisted living to a skilled nursing facility at [facility]. Based on what I am seeing currently these pressure injuries occurred prior to that 2-week time I do not see any new or obvious pressure injury at this point. I discussed with the patient as well as her family member that this does appear to be doing better in my opinion than likely where things started. They are unsure as to whether or not she has an air mattress at the facility I do think that would be something that would be appropriate and good for her to have but at the  moment I do not know for sure whether that is already in place if not that definitely is can be 1 of the recommendations. Also think a Roho cushion for her wheelchair would be good as well as Prevalon offloading boots. The patient currently does not have any severe pain although when I was cleaning some of the areas she did note there was some discomfort at several of the locations. She does have a history of hypertension as well. 10/29; this is a patient from [facility] nursing home. She is nonambulatory. She is here for follow-up on 4 different wounds 1 over the left lateral malleolus, the right medial calcaneus, the left posterior hip and I believe a new area over the sacrum. The patient is nonambulatory but apparently eats well. 03/24/2019 on evaluation today patient actually appears to be doing better with regard to her wounds in general based on what I am seeing. Fortunately there is no signs of active infection at this time. No fever chills noted. Overall been very pleased with the progress that seems to be occurring since I last saw her. There is a little bit of debridement That will need to be performed today. Electronic Signature(s) Signed: 03/24/2019 3:16:18 PM By: Lenda Kelp PA-C Entered By: Lenda Kelp on 03/24/2019 15:16:17 [name], [name] ([id number]) -------------------------------------------------------------------------------- Physical Exam Details Patient Name: [name] Lye A. Date of Service: 03/24/2019 2:30 PM Medical Record Number: [id number] Patient Account Number: [id number] Date of Birth/Sex: 1929/05/19 (85 y.o. F) Treating RN: Rodell Perna Primary Care Provider: Dewaine Oats Other Clinician: Referring Provider: TATE, Katherina Right Treating Provider/Extender: STONE III, HOYT Weeks in Treatment: 3 Constitutional Well-nourished and well-hydrated in no acute distress. Respiratory normal breathing without difficulty. clear to auscultation  bilaterally. Cardiovascular regular rate and rhythm with normal S1, S2. Psychiatric this patient is able to make decisions and demonstrates good insight into disease process. Alert and Oriented x 3. pleasant and cooperative. Notes Patient's wound at this time at all locations appear to be doing better except for the lateral malleolus which actually is a little bit larger but I think is more irritation from the bandaging than anything else. Debridement was performed in regard to the sacral and left ischial locations. Electronic Signature(s) Signed: 03/24/2019 3:25:17 PM By: Larina Bras  III, Hoyt PA-C Entered By: Lenda Kelp on 03/24/2019 15:25:17 [name] ([id number]) -------------------------------------------------------------------------------- Physician Orders Details Patient Name: [name] Lye A. Date of Service: 03/24/2019 2:30 PM Medical Record Number: [id number] Patient Account Number: [id number] Date of Birth/Sex: [birth year] (85 y.o. F) Treating RN: Rodell Perna Primary Care Provider: Dewaine Oats Other Clinician: Referring Provider: Dewaine Oats Treating Provider/Extender: Linwood Dibbles, HOYT Weeks in Treatment: 3 Verbal / Phone Orders: No Diagnosis Coding ICD-10 Coding Code Description [phone] Pressure ulcer of left heel, stage 3 L89.513 Pressure ulcer of right ankle, stage 3 L89.224 Pressure ulcer of left hip, stage 4 I10 Essential (primary) hypertension F01.50 Vascular dementia without behavioral disturbance Wound Cleansing Wound #1 Left,Lateral Malleolus o Clean wound with Normal Saline. o Cleanse wound with mild soap and water Wound #2 Right,Medial Calcaneus o Clean wound with Normal Saline. o Cleanse wound with mild soap and water Wound #3 Left Ischium o Clean wound with Normal Saline. o Cleanse wound with mild soap and water Wound #4 Midline Sacrum o Clean wound with Normal Saline. o Cleanse wound with mild soap and water Anesthetic (add to  Medication List) Wound #1 Left,Lateral Malleolus o Topical Lidocaine 4% cream applied to wound bed prior to debridement (In Clinic Only). Wound #2 Right,Medial Calcaneus o Topical Lidocaine 4% cream applied to wound bed prior to debridement (In Clinic Only). Wound #3 Left Ischium o Topical Lidocaine 4% cream applied to wound bed prior to debridement (In Clinic Only). Wound #4 Midline Sacrum o Topical Lidocaine 4% cream applied to wound bed prior to debridement (In Clinic Only). Primary Wound Dressing Wound #1 Left,Lateral Malleolus o Silver Collagen Wound #2 Right,Medial Calcaneus Mule, Tasia A. ([id number]) o Silver Collagen Wound #3 Left Ischium o Silver Collagen Wound #4 Midline Sacrum o Silver Collagen Secondary Dressing Wound #2 Right,Medial Calcaneus o Boardered Foam Dressing Wound #3 Left Ischium o Boardered Foam Dressing o Dry Gauze - over saline moistened gauze o Saline moistened gauze - over silver collagen o Boardered Foam Dressing Wound #4 Midline Sacrum o Boardered Foam Dressing Wound #1 Left,Lateral Malleolus o ABD and Kerlix/Conform Dressing Change Frequency Wound #1 Left,Lateral Malleolus o Change dressing every day. Wound #2 Right,Medial Calcaneus o Change dressing every day. Wound #3 Left Ischium o Change dressing every day. Wound #4 Midline Sacrum o Change dressing every day. Follow-up Appointments Wound #1 Left,Lateral Malleolus o Return Appointment in 2 weeks. Wound #2 Right,Medial Calcaneus o Return Appointment in 2 weeks. Wound #3 Left Ischium o Return Appointment in 2 weeks. Wound #4 Midline Sacrum o Return Appointment in 2 weeks. Off-Loading Wound #1 Left,Lateral Malleolus o Roho cushion for wheelchair o Mattress - Patient needs air matress o Turn and reposition every 2 hours o Other: - Offloading boots for bilateral feet Moffet, Garrie A. ([id number]) Electronic Signature(s) Signed:  03/24/2019 4:39:17 PM By: Rodell Perna Signed: 03/24/2019 6:06:55 PM By: Lenda Kelp PA-C Entered By: Rodell Perna on 03/24/2019 15:14:14 Kirchgessner, Eldred A. ([id number]) -------------------------------------------------------------------------------- Problem List Details Patient Name: [name] Lye A. Date of Service: 03/24/2019 2:30 PM Medical Record Number: [id number] Patient Account Number: [id number] Date of Birth/Sex: Jun 01, 1928 (85 y.o. F) Treating RN: Rodell Perna Primary Care Provider: Dewaine Oats Other Clinician: Referring Provider: Dewaine Oats Treating Provider/Extender: Linwood Dibbles, HOYT Weeks in Treatment: 3 Active Problems ICD-10 Evaluated Encounter Code Description Active Date Today Diagnosis L89.623 Pressure ulcer of left heel, stage 3 03/03/2019 No Yes L89.513 Pressure ulcer of right ankle, stage 3 03/03/2019 No Yes L89.224 Pressure ulcer of left hip, stage  4 03/03/2019 No Yes L89.153 Pressure ulcer of sacral region, stage 3 03/24/2019 No Yes I10 Essential (primary) hypertension 03/03/2019 No Yes F01.50 Vascular dementia without behavioral disturbance 03/03/2019 No Yes Inactive Problems Resolved Problems Electronic Signature(s) Signed: 03/24/2019 3:26:55 PM By: Lenda KelpStone III, Hoyt PA-C Previous Signature: 03/24/2019 2:35:12 PM Version By: Lenda KelpStone III, Hoyt PA-C Entered By: Lenda KelpStone III, Hoyt on 03/24/2019 15:26:54 Terlizzi, Alonzo A. ([id number]) -------------------------------------------------------------------------------- Progress Note Details Patient Name: [name] LyeHALL, Lorette A. Date of Service: 03/24/2019 2:30 PM Medical Record Number: [id number] Patient Account Number: [id number] Date of Birth/Sex: 10/28/1928 (85 y.o. F) Treating RN: Rodell PernaScott, Dajea Primary Care Provider: Dewaine OatsATE, DENNY Other Clinician: Referring Provider: Dewaine OatsATE, DENNY Treating Provider/Extender: Linwood DibblesSTONE III, HOYT Weeks in Treatment: 3 Subjective Chief Complaint Information obtained from Patient Multiple pressure ulcers History  of Present Illness (HPI) 03/03/2019 on evaluation today patient presents for initial evaluation here in the clinic today concerning issues that she has been having with wounds which are pressure in nature at multiple locations currently. This is on her left heel, right ankle, left hip/ischial location, and all her to varying degrees of severity and depth. The worst is on the left ischial location. At this time the patient has been tolerating dressing changes although I am not exactly sure the dressings that have been utilized prior to coming in today. She does have some necrotic tissue noted at several locations this can require at least some degree of cleaning prior to application of dressings going forward. The patient does have dementia and unfortunately in recent months has become increasingly immobile requiring more significant treatment she was just recently moved roughly 2 weeks ago from assisted living to a skilled nursing facility at [facility]. Based on what I am seeing currently these pressure injuries occurred prior to that 2-week time I do not see any new or obvious pressure injury at this point. I discussed with the patient as well as her family member that this does appear to be doing better in my opinion than likely where things started. They are unsure as to whether or not she has an air mattress at the facility I do think that would be something that would be appropriate and good for her to have but at the moment I do not know for sure whether that is already in place if not that definitely is can be 1 of the recommendations. Also think a Roho cushion for her wheelchair would be good as well as Prevalon offloading boots. The patient currently does not have any severe pain although when I was cleaning some of the areas she did note there was some discomfort at several of the locations. She does have a history of hypertension as well. 10/29; this is a patient from [facility]. She is nonambulatory. She is here for follow-up on 4 different wounds 1 over the left lateral malleolus, the right medial calcaneus, the left posterior hip and I believe a new area over the sacrum. The patient is nonambulatory but apparently eats well. 03/24/2019 on evaluation today patient actually appears to be doing better with regard to her wounds in general based on what I am seeing. Fortunately there is no signs of active infection at this time. No fever chills noted. Overall been very pleased with the progress that seems to be occurring since I last saw her. There is a little bit of debridement That will need to be performed today. Patient History Unable to Obtain Patient History due to Dementia. Information obtained from  Patient. Social History Never smoker. Medical History Eyes Patient has history of Glaucoma Denies history of Cataracts, Optic Neuritis Ear/Nose/Mouth/Throat Denies history of Chronic sinus problems/congestion, Middle ear problems Hematologic/Lymphatic Denies history of Anemia, Hemophilia, Human Immunodeficiency Virus, Lymphedema, Sickle Cell Disease Cheramie, Isabel A. ([id number]) Respiratory Denies history of Aspiration, Asthma, Chronic Obstructive Pulmonary Disease (COPD), Pneumothorax, Sleep Apnea, Tuberculosis Cardiovascular Patient has history of Hypertension Denies history of Angina, Arrhythmia, Congestive Heart Failure, Coronary Artery Disease, Deep Vein Thrombosis, Hypotension, Myocardial Infarction, Peripheral Arterial Disease, Peripheral Venous Disease, Phlebitis, Vasculitis Gastrointestinal Denies history of Cirrhosis , Colitis, Crohn s, Hepatitis A, Hepatitis B, Hepatitis C Endocrine Denies history of Type I Diabetes, Type II Diabetes Genitourinary Denies history of End Stage Renal Disease Immunological Denies history of Lupus Erythematosus, Raynaud s, Scleroderma Integumentary (Skin) Denies history of History of Burn, History of  pressure wounds Musculoskeletal Patient has history of Osteoarthritis Denies history of Gout, Rheumatoid Arthritis, Osteomyelitis Neurologic Patient has history of Dementia Denies history of Neuropathy, Quadriplegia, Paraplegia, Seizure Disorder Oncologic Denies history of Received Chemotherapy, Received Radiation Psychiatric Denies history of Anorexia/bulimia, Confinement Anxiety Review of Systems (ROS) Constitutional Symptoms (General Health) Denies complaints or symptoms of Fatigue, Fever, Chills, Marked Weight Change. Respiratory Denies complaints or symptoms of Chronic or frequent coughs, Shortness of Breath. Cardiovascular Denies complaints or symptoms of Chest pain, LE edema. Psychiatric Denies complaints or symptoms of Anxiety, Claustrophobia. Objective Constitutional Well-nourished and well-hydrated in no acute distress. Vitals Time Taken: 2:20 PM, Height: 67 in, Weight: 140 lbs, BMI: 21.9, Temperature: 98.7 F, Respiratory Rate: 16 breaths/min, Blood Pressure: 113/61 mmHg. Respiratory normal breathing without difficulty. clear to auscultation bilaterally. Cardiovascular regular rate and rhythm with normal S1, S2. QUINCEY, NORED A. ([id number]) Psychiatric this patient is able to make decisions and demonstrates good insight into disease process. Alert and Oriented x 3. pleasant and cooperative. General Notes: Patient's wound at this time at all locations appear to be doing better except for the lateral malleolus which actually is a little bit larger but I think is more irritation from the bandaging than anything else. Debridement was performed in regard to the sacral and left ischial locations. Integumentary (Hair, Skin) Wound #1 status is Open. Original cause of wound was Pressure Injury. The wound is located on the Left,Lateral Malleolus. The wound measures 1cm length x 1cm width x 0.2cm depth; 0.785cm^2 area and 0.157cm^3 volume. There is Fat Layer (Subcutaneous  Tissue) Exposed exposed. There is no tunneling or undermining noted. There is a large amount of serosanguineous drainage noted. The wound margin is epibole. There is small (1-33%) pale granulation within the wound bed. There is a large (67-100%) amount of necrotic tissue within the wound bed including Adherent Slough. Wound #2 status is Open. Original cause of wound was Pressure Injury. The wound is located on the Right,Medial Calcaneus. The wound measures 0.5cm length x 0.8cm width x 0.1cm depth; 0.314cm^2 area and 0.031cm^3 volume. There is Fat Layer (Subcutaneous Tissue) Exposed exposed. There is no tunneling or undermining noted. There is a large amount of serosanguineous drainage noted. The wound margin is epibole. There is no granulation within the wound bed. There is a small (1-33%) amount of necrotic tissue within the wound bed including Adherent Slough. Wound #3 status is Open. Original cause of wound was Pressure Injury. The wound is located on the Left Ischium. The wound measures 2.1cm length x 3.4cm width x 1.5cm depth; 5.608cm^2 area and 8.412cm^3 volume. There is muscle and Fat Layer (Subcutaneous Tissue) Exposed exposed. There is  undermining starting at 6:00 and ending at 10:00 with a maximum distance of 3cm. There is a large amount of serosanguineous drainage noted. The wound margin is thickened. There is no granulation within the wound bed. There is a large (67-100%) amount of necrotic tissue within the wound bed including Eschar and Adherent Slough. Wound #4 status is Open. Original cause of wound was Pressure Injury. The wound is located on the Midline Sacrum. The wound measures 1cm length x 0.6cm width x 0.1cm depth; 0.471cm^2 area and 0.047cm^3 volume. The wound is limited to skin breakdown. There is no tunneling or undermining noted. There is a medium amount of serous drainage noted. The wound margin is indistinct and nonvisible. There is medium (34-66%) pink granulation within  the wound bed. There is a medium (34-66%) amount of necrotic tissue within the wound bed including Adherent Slough. Assessment Active Problems ICD-10 Pressure ulcer of left heel, stage 3 Pressure ulcer of right ankle, stage 3 Pressure ulcer of left hip, stage 4 Pressure ulcer of sacral region, stage 3 Essential (primary) hypertension Vascular dementia without behavioral disturbance Procedures Supple, Adriyana A. ([id number]) Wound #3 Pre-procedure diagnosis of Wound #3 is a Pressure Ulcer located on the Left Ischium . There was a Excisional Skin/Subcutaneous Tissue Debridement with a total area of 7.14 sq cm performed by STONE III, HOYT E., PA-C. With the following instrument(s): Curette to remove Viable and Non-Viable tissue/material. Material removed includes Subcutaneous Tissue and Slough and after achieving pain control using Lidocaine. A time out was conducted at 15:09, prior to the start of the procedure. There was no bleeding. The procedure was tolerated well. Post Debridement Measurements: 2.1cm length x 3.4cm width x 1.5cm depth; 8.412cm^3 volume. Post debridement Stage noted as Category/Stage IV. Character of Wound/Ulcer Post Debridement is stable. Post procedure Diagnosis Wound #3: Same as Pre-Procedure Wound #4 Pre-procedure diagnosis of Wound #4 is a Pressure Ulcer located on the Midline Sacrum . There was a Excisional Skin/Subcutaneous Tissue Debridement with a total area of 0.6 sq cm performed by STONE III, HOYT E., PA-C. With the following instrument(s): Curette to remove Viable and Non-Viable tissue/material. Material removed includes Subcutaneous Tissue and Slough and after achieving pain control using Lidocaine. A time out was conducted at 15:09, prior to the start of the procedure. There was no bleeding. The procedure was tolerated well. Post Debridement Measurements: 1cm length x 0.6cm width x 0.1cm depth; 0.047cm^3 volume. Post debridement Stage noted as Category/Stage  III. Character of Wound/Ulcer Post Debridement is stable. Post procedure Diagnosis Wound #4: Same as Pre-Procedure Plan Wound Cleansing: Wound #1 Left,Lateral Malleolus: Clean wound with Normal Saline. Cleanse wound with mild soap and water Wound #2 Right,Medial Calcaneus: Clean wound with Normal Saline. Cleanse wound with mild soap and water Wound #3 Left Ischium: Clean wound with Normal Saline. Cleanse wound with mild soap and water Wound #4 Midline Sacrum: Clean wound with Normal Saline. Cleanse wound with mild soap and water Anesthetic (add to Medication List): Wound #1 Left,Lateral Malleolus: Topical Lidocaine 4% cream applied to wound bed prior to debridement (In Clinic Only). Wound #2 Right,Medial Calcaneus: Topical Lidocaine 4% cream applied to wound bed prior to debridement (In Clinic Only). Wound #3 Left Ischium: Topical Lidocaine 4% cream applied to wound bed prior to debridement (In Clinic Only). Wound #4 Midline Sacrum: Topical Lidocaine 4% cream applied to wound bed prior to debridement (In Clinic Only). Primary Wound Dressing: Wound #1 Left,Lateral Malleolus: Silver Collagen Wound #2 Right,Medial Calcaneus: Silver Collagen Wound #3 Left Ischium:  Silver Collagen Wound #4 Midline SacrumKRISTE, BROMAN A. ([id number]) Silver Collagen Secondary Dressing: Wound #2 Right,Medial Calcaneus: Boardered Foam Dressing Wound #3 Left Ischium: Boardered Foam Dressing Dry Gauze - over saline moistened gauze Saline moistened gauze - over silver collagen Boardered Foam Dressing Wound #4 Midline Sacrum: Boardered Foam Dressing Wound #1 Left,Lateral Malleolus: ABD and Kerlix/Conform Dressing Change Frequency: Wound #1 Left,Lateral Malleolus: Change dressing every day. Wound #2 Right,Medial Calcaneus: Change dressing every day. Wound #3 Left Ischium: Change dressing every day. Wound #4 Midline Sacrum: Change dressing every day. Follow-up Appointments: Wound #1  Left,Lateral Malleolus: Return Appointment in 2 weeks. Wound #2 Right,Medial Calcaneus: Return Appointment in 2 weeks. Wound #3 Left Ischium: Return Appointment in 2 weeks. Wound #4 Midline Sacrum: Return Appointment in 2 weeks. Off-Loading: Wound #1 Left,Lateral Malleolus: Roho cushion for wheelchair Mattress - Patient needs air matress Turn and reposition every 2 hours Other: - Offloading boots for bilateral feet 1. I would recommend currently that we going continue with the current wound care measures for the time being as the patient seems to be doing well at all locations. She is in agreement with this plan. That skin to be silver collagen to all locations and then subsequently for the ischial location will packing saline moistened gauze and behind this. 2. I recommend appropriate offloading be continued I think that still of utmost importance to prevent things from worsening as we have previously dictated and recommended. 3. I will see any evidence of infection at this point although that is something we would definitely keep an eye on going forward as well. We will see patient back for reevaluation in 2 weeks here in the clinic. If anything worsens or changes patient will contact our office for additional recommendations. Electronic Signature(s) Signed: 03/24/2019 3:27:08 PM By: Worthy Keeler PA-C Previous Signature: 03/24/2019 3:26:14 PM Version By: Worthy Keeler PA-C Entered By: Worthy Keeler on 03/24/2019 15:27:08 [name] ([id number] Crane, Aundra Dubin ([id number]) -------------------------------------------------------------------------------- ROS/PFSH Details Patient Name: [name] Date of Service: 03/24/2019 2:30 PM Medical Record Number: [id number] Patient Account Number: [id number] Date of Birth/Sex: 1929/04/29 (85 y.o. F) Treating RN: Army Melia Primary Care Provider: Benita Stabile Other Clinician: Referring Provider: Benita Stabile Treating  Provider/Extender: STONE III, HOYT Weeks in Treatment: 3 Unable to Obtain Patient History due to oo Dementia Information Obtained From Patient Constitutional Symptoms (General Health) Complaints and Symptoms: Negative for: Fatigue; Fever; Chills; Marked Weight Change Respiratory Complaints and Symptoms: Negative for: Chronic or frequent coughs; Shortness of Breath Medical History: Negative for: Aspiration; Asthma; Chronic Obstructive Pulmonary Disease (COPD); Pneumothorax; Sleep Apnea; Tuberculosis Cardiovascular Complaints and Symptoms: Negative for: Chest pain; LE edema Medical History: Positive for: Hypertension Negative for: Angina; Arrhythmia; Congestive Heart Failure; Coronary Artery Disease; Deep Vein Thrombosis; Hypotension; Myocardial Infarction; Peripheral Arterial Disease; Peripheral Venous Disease; Phlebitis; Vasculitis Psychiatric Complaints and Symptoms: Negative for: Anxiety; Claustrophobia Medical History: Negative for: Anorexia/bulimia; Confinement Anxiety Eyes Medical History: Positive for: Glaucoma Negative for: Cataracts; Optic Neuritis Ear/Nose/Mouth/Throat Medical History: Negative for: Chronic sinus problems/congestion; Middle ear problems Hematologic/Lymphatic AAYAT, HAJJAR A. ([id number]) Medical History: Negative for: Anemia; Hemophilia; Human Immunodeficiency Virus; Lymphedema; Sickle Cell Disease Gastrointestinal Medical History: Negative for: Cirrhosis ; Colitis; Crohnos; Hepatitis A; Hepatitis B; Hepatitis C Endocrine Medical History: Negative for: Type I Diabetes; Type II Diabetes Genitourinary Medical History: Negative for: End Stage Renal Disease Immunological Medical History: Negative for: Lupus Erythematosus; Raynaudos; Scleroderma Integumentary (Skin) Medical History: Negative for: History of Burn; History of pressure wounds  Musculoskeletal Medical History: Positive for: Osteoarthritis Negative for: Gout; Rheumatoid Arthritis;  Osteomyelitis Neurologic Medical History: Positive for: Dementia Negative for: Neuropathy; Quadriplegia; Paraplegia; Seizure Disorder Oncologic Medical History: Negative for: Received Chemotherapy; Received Radiation HBO Extended History Items Eyes: Glaucoma Immunizations Pneumococcal Vaccine: Received Pneumococcal Vaccination: No Implantable Devices None Family and Social History Never smoker DELIA, SLATTEN ([id number]) Physician Affirmation I have reviewed and agree with the above information. Electronic Signature(s) Signed: 03/24/2019 4:39:17 PM By: Rodell Perna Signed: 03/24/2019 6:06:55 PM By: Lenda Kelp PA-C Entered By: Lenda Kelp on 03/24/2019 15:16:39 Karger, [name] ([id number]) -------------------------------------------------------------------------------- SuperBill Details Patient Name: [name] Lye A. Date of Service: 03/24/2019 Medical Record Number: [id number] Patient Account Number: [id number] Date of Birth/Sex: 05/07/1929 (85 y.o. F) Treating RN: Rodell Perna Primary Care Provider: Dewaine Oats Other Clinician: Referring Provider: Dewaine Oats Treating Provider/Extender: Linwood Dibbles, HOYT Weeks in Treatment: 3 Diagnosis Coding ICD-10 Codes Code Description [phone] Pressure ulcer of left heel, stage 3 L89.513 Pressure ulcer of right ankle, stage 3 L89.224 Pressure ulcer of left hip, stage 4 L89.153 Pressure ulcer of sacral region, stage 3 I10 Essential (primary) hypertension F01.50 Vascular dementia without behavioral disturbance Facility Procedures CPT4 Code: [id number] Description: 11042 - DEB SUBQ TISSUE 20 SQ CM/< ICD-10 Diagnosis Description L89.224 Pressure ulcer of left hip, stage 4 L89.153 Pressure ulcer of sacral region, stage 3 Modifier: Quantity: 1 Physician Procedures CPT4 Code: [id number] Description: 11042 - WC PHYS SUBQ TISS 20 SQ CM ICD-10 Diagnosis Description L89.224 Pressure ulcer of left hip, stage 4 L89.153 Pressure ulcer of sacral  region, stage 3 Modifier: Quantity: 1 Electronic Signature(s) Signed: 03/24/2019 3:27:19 PM By: Lenda Kelp PA-C Entered By: Lenda Kelp on 03/24/2019 15:27:18

## 2019-03-24 NOTE — Progress Notes (Signed)
[name] [name], [name] ([id number]) Visit Report for 03/24/2019 Arrival Information Details Patient Name: [name] [name], [name] Date of Service: 03/24/2019 2:30 PM Medical Record Number: [id number] Patient Account Number: [id number] Date of Birth/Sex: 12/07/1928 (85 y.o. F) Treating RN: Rodell PernaScott, Dajea Primary Care [name]: Dewaine OatsATE, DENNY Other Clinician: Referring Susa Bones: Dewaine OatsATE, DENNY Treating Trinisha Paget/Extender: Linwood DibblesSTONE III, HOYT Weeks in Treatment: 3 Visit Information History Since Last Visit Added or deleted any medications: No Patient Arrived: Wheel Chair Any new allergies or adverse reactions: No Arrival Time: 14:25 Had a fall or experienced change in No Accompanied By: daughter in law activities of daily living that may affect Transfer Assistance: Manual risk of falls: Patient Identification Verified: Yes Signs or symptoms of abuse/neglect since last visito No Secondary Verification Process Completed: Yes Hospitalized since last visit: No Implantable device outside of the clinic excluding No cellular tissue based products placed in the center since last visit: Has Dressing in Place as Prescribed: Yes Pain Present Now: No Electronic Signature(s) Signed: 03/24/2019 4:57:32 PM By: Dayton MartesWallace, RCP,RRT,CHT, Sallie RCP, RRT, CHT Entered By: Dayton MartesWallace, RCP,RRT,CHT, Sallie on 03/24/2019 14:25:46 Pendley, [name] IshiharaWILMA A. ([id number]) -------------------------------------------------------------------------------- Encounter Discharge Information Details Patient Name: [name] [name], [name] Date of Service: 03/24/2019 2:30 PM Medical Record Number: [id number] Patient Account Number: [id number] Date of Birth/Sex: 08/11/1928 (85 y.o. F) Treating RN: Rodell PernaScott, Dajea Primary Care Sayre Witherington: Dewaine OatsATE, DENNY Other Clinician: Referring An Lannan: TATE, Katherina RightENNY Treating Raychel Dowler/Extender: Linwood DibblesSTONE III, HOYT Weeks in Treatment: 3 Encounter Discharge Information Items Post Procedure Vitals Discharge Condition: Stable Temperature (F): 98.7 Ambulatory  Status: Wheelchair Pulse (bpm): 60 Discharge Destination: Home Respiratory Rate (breaths/min): 16 Transportation: Other Blood Pressure (mmHg): 113/61 Accompanied By: family Schedule Follow-up Appointment: Yes Clinical Summary of Care: Electronic Signature(s) Signed: 03/24/2019 4:39:17 PM By: Rodell PernaScott, Dajea Entered By: Rodell PernaScott, Dajea on 03/24/2019 15:16:52 [name] [name], [name] ([id number]) -------------------------------------------------------------------------------- Lower Extremity Assessment Details Patient Name: [name] [name], [name] Date of Service: 03/24/2019 2:30 PM Medical Record Number: [id number] Patient Account Number: [id number] Date of Birth/Sex: 12/05/1928 (85 y.o. F) Treating RN: Arnette NorrisBiell, Kristina Primary Care Tylisha Danis: Dewaine OatsATE, DENNY Other Clinician: Referring Anelise Staron: Dewaine OatsATE, DENNY Treating Yazmen Briones/Extender: STONE III, HOYT Weeks in Treatment: 3 Vascular Assessment Pulses: Dorsalis Pedis Palpable: [Left:Yes] [Right:Yes] Posterior Tibial Palpable: [Left:Yes] [Right:Yes] Electronic Signature(s) Signed: 03/24/2019 4:15:06 PM By: Arnette NorrisBiell, Kristina Entered By: Arnette NorrisBiell, Kristina on 03/24/2019 14:43:12 [name] [name], [name] ([id number]) -------------------------------------------------------------------------------- Multi Wound Chart Details Patient Name: [name] [name], [name] Date of Service: 03/24/2019 2:30 PM Medical Record Number: [id number] Patient Account Number: [id number] Date of Birth/Sex: 07/28/1928 (85 y.o. F) Treating RN: Rodell PernaScott, Dajea Primary Care Charlis Harner: Dewaine OatsATE, DENNY Other Clinician: Referring Adrina Armijo: Dewaine OatsATE, DENNY Treating Ura Yingling/Extender: STONE III, HOYT Weeks in Treatment: 3 Vital Signs Height(in): 67 Pulse(bpm): Weight(lbs): 140 Blood Pressure(mmHg): 113/61 Body Mass Index(BMI): 22 Temperature(F): 98.7 Respiratory Rate 16 (breaths/min): Photos: Wound Location: Left Malleolus - Lateral Right Calcaneus - Medial Left Ischium Wounding Event: Pressure Injury Pressure Injury Pressure  Injury Primary Etiology: Pressure Ulcer Pressure Ulcer Pressure Ulcer Comorbid History: Glaucoma, Hypertension, Glaucoma, Hypertension, Glaucoma, Hypertension, Osteoarthritis, Dementia Osteoarthritis, Dementia Osteoarthritis, Dementia Date Acquired: 07/18/2018 07/18/2018 07/18/2018 Weeks of Treatment: 3 3 3  Wound Status: Open Open Open Clustered Wound: No No No Clustered Quantity: N/A N/A N/A Measurements L x W x D 1x1x0.2 0.5x0.8x0.1 2.1x3.4x1.5 (cm) Area (cm) : 0.785 0.314 5.608 Volume (cm) : 0.157 0.031 8.412 % Reduction in Area: -42.70% 89.30% 51.40% % Reduction in Volume: -42.70% 97.90% 66.90% Starting Position 1 6 (o'clock): Ending Position 1 10 (o'clock): Maximum Distance 1 (cm): 3 Undermining: No No Yes Classification: Category/Stage  III Category/Stage III Category/Stage IV Exudate Amount: Large Large Large Exudate Type: Serosanguineous Serosanguineous Serosanguineous Exudate Color: red, brown red, brown red, brown Wound Margin: Epibole Epibole Thickened Granulation Amount: Small (1-33%) None Present (0%) None Present (0%) Granulation Quality: Pale N/A N/A Livecchi, [name] ([id number]) Necrotic Amount: Large (67-100%) Small (1-33%) Large (67-100%) Necrotic Tissue: Adherent Slough Adherent Slough Eschar, Adherent Slough Exposed Structures: Fat Layer (Subcutaneous Fat Layer (Subcutaneous Fat Layer (Subcutaneous Tissue) Exposed: Yes Tissue) Exposed: Yes Tissue) Exposed: Yes Fascia: No Fascia: No Muscle: Yes Tendon: No Tendon: No Fascia: No Muscle: No Muscle: No Tendon: No Joint: No Joint: No Joint: No Bone: No Bone: No Bone: No Epithelialization: None None None Wound Number: 4 N/A N/A Photos: N/A N/A Wound Location: Sacrum - Midline N/A N/A Wounding Event: Pressure Injury N/A N/A Primary Etiology: Pressure Ulcer N/A N/A Comorbid History: Glaucoma, Hypertension, N/A N/A Osteoarthritis, Dementia Date Acquired: 07/18/2018 N/A N/A Weeks of Treatment: 3 N/A  N/A Wound Status: Open N/A N/A Clustered Wound: Yes N/A N/A Clustered Quantity: 3 N/A N/A Measurements L x W x D 1x0.6x0.1 N/A N/A (cm) Area (cm) : 0.471 N/A N/A Volume (cm) : 0.047 N/A N/A % Reduction in Area: 97.30% N/A N/A % Reduction in Volume: 97.30% N/A N/A Undermining: No N/A N/A Classification: Category/Stage III N/A N/A Exudate Amount: Medium N/A N/A Exudate Type: Serous N/A N/A Exudate Color: amber N/A N/A Wound Margin: Indistinct, nonvisible N/A N/A Granulation Amount: Medium (34-66%) N/A N/A Granulation Quality: Pink N/A N/A Necrotic Amount: Medium (34-66%) N/A N/A Necrotic Tissue: Adherent Slough N/A N/A Exposed Structures: Fascia: No N/A N/A Fat Layer (Subcutaneous Tissue) Exposed: No Tendon: No Muscle: No Joint: No Bone: No Limited to Skin Breakdown Epithelialization: Medium (34-66%) N/A N/A Treatment Notes AZHARIA, SURRATT A. ([id number]) Electronic Signature(s) Signed: 03/24/2019 4:39:17 PM By: Rodell Perna Entered By: Rodell Perna on 03/24/2019 15:07:19 [name] [name] ([id number]) -------------------------------------------------------------------------------- Multi-Disciplinary Care Plan Details Patient Name: [name] Lye A. Date of Service: 03/24/2019 2:30 PM Medical Record Number: [id number] Patient Account Number: [id number] Date of Birth/Sex: 1929/04/19 (85 y.o. F) Treating RN: Rodell Perna Primary Care Aayush Gelpi: Dewaine Oats Other Clinician: Referring Amaia Lavallie: Dewaine Oats Treating Dalis Beers/Extender: Linwood Dibbles, HOYT Weeks in Treatment: 3 Active Inactive Abuse / Safety / Falls / Self Care Management Nursing Diagnoses: Potential for falls Goals: Patient will remain injury free related to falls Date Initiated: 03/03/2019 Target Resolution Date: 03/16/2019 Goal Status: Active Interventions: Assess fall risk on admission and as needed Notes: Medication Nursing Diagnoses: Knowledge deficit related to medication safety: actual or  potential Goals: Patient/caregiver will demonstrate understanding of all current medications Date Initiated: 03/03/2019 Target Resolution Date: 03/16/2019 Goal Status: Active Interventions: Assess for medication contraindications each visit where new medications are prescribed Treatment Activities: New medication prescribed at Wound Center : 03/03/2019 Notes: Necrotic Tissue Nursing Diagnoses: Impaired tissue integrity related to necrotic/devitalized tissue Goals: Necrotic/devitalized tissue will be minimized in the wound bed Date Initiated: 03/03/2019 Target Resolution Date: 03/16/2019 Goal Status: Active TEQUISHA, MAAHS A. ([id number]) Interventions: Assess patient pain level pre-, during and post procedure and prior to discharge Treatment Activities: Apply topical anesthetic as ordered : 03/03/2019 Notes: Nutrition Nursing Diagnoses: Potential for alteratiion in Nutrition/Potential for imbalanced nutrition Goals: Patient/caregiver agrees to and verbalizes understanding of need to use nutritional supplements and/or vitamins as prescribed Date Initiated: 03/03/2019 Target Resolution Date: 03/16/2019 Goal Status: Active Interventions: Provide education on nutrition Notes: Orientation to the Wound Care Program Nursing Diagnoses: Knowledge deficit related to the wound healing center program Goals:  Patient/caregiver will verbalize understanding of the [place] Date Initiated: 03/03/2019 Target Resolution Date: 03/16/2019 Goal Status: Active Interventions: Provide education on orientation to the wound center Notes: Pressure Nursing Diagnoses: Knowledge deficit related to management of pressures ulcers Goals: Patient/caregiver will verbalize understanding of pressure ulcer management Date Initiated: 03/03/2019 Target Resolution Date: 03/16/2019 Goal Status: Active Interventions: Assess: immobility, friction, shearing, incontinence upon admission and  as needed Provide education on pressure ulcers Treatment Activities: Patient referred for home evaluation of offloading devices/mattresses : 03/03/2019 MONASIA, LAIR A. ([id number]) Patient referred for pressure reduction/relief devices : 03/03/2019 Pressure reduction/relief device ordered : 03/03/2019 Notes: Wound/Skin Impairment Nursing Diagnoses: Impaired tissue integrity Goals: Ulcer/skin breakdown will have a volume reduction of 30% by week 4 Date Initiated: 03/03/2019 Target Resolution Date: 03/03/2019 Goal Status: Active Interventions: Assess ulceration(s) every visit Treatment Activities: Skin care regimen initiated : 03/03/2019 Notes: Electronic Signature(s) Signed: 03/24/2019 4:39:17 PM By: Army Melia Entered By: Army Melia on 03/24/2019 15:07:07 [name] [name], [name] ([id number]) -------------------------------------------------------------------------------- Pain Assessment Details Patient Name: [name] Roan A. Date of Service: 03/24/2019 2:30 PM Medical Record Number: [id number] Patient Account Number: [id number] Date of Birth/Sex: 25-Mar-1929 (85 y.o. F) Treating RN: Army Melia Primary Care Filip Luten: Benita Stabile Other Clinician: Referring Jessicca Stitzer: Benita Stabile Treating Demetrious Rainford/Extender: Melburn Hake, HOYT Weeks in Treatment: 3 Active Problems Location of Pain Severity and Description of Pain Patient Has Paino No Site Locations Pain Management and Medication Current Pain Management: Electronic Signature(s) Signed: 03/24/2019 4:39:17 PM By: Army Melia Signed: 03/24/2019 4:57:32 PM By: Lorine Bears RCP, RRT, CHT Entered By: Lorine Bears on 03/24/2019 14:25:53 [name] [name], Aundra Dubin ([id number]) -------------------------------------------------------------------------------- Patient/Caregiver Education Details Patient Name: [name] Roan A. Date of Service: 03/24/2019 2:30 PM Medical Record Number: [id number] Patient Account Number: [id number] Date  of Birth/Gender: 1928-06-11 (85 y.o. F) Treating RN: Army Melia Primary Care Physician: Benita Stabile Other Clinician: Referring Physician: Benita Stabile Treating Physician/Extender: Sharalyn Ink in Treatment: 3 Education Assessment Education Provided To: Patient Education Topics Provided Wound/Skin Impairment: Handouts: Caring for Your Ulcer Methods: Demonstration, Explain/Verbal Responses: State content correctly Electronic Signature(s) Signed: 03/24/2019 4:39:17 PM By: Army Melia Entered By: Army Melia on 03/24/2019 15:15:38 Calvi, Jeryl A. ([id number]) -------------------------------------------------------------------------------- Wound Assessment Details Patient Name: [name] Roan A. Date of Service: 03/24/2019 2:30 PM Medical Record Number: [id number] Patient Account Number: [id number] Date of Birth/Sex: 1928/10/08 (85 y.o. F) Treating RN: Harold Barban Primary Care Bernard Slayden: Benita Stabile Other Clinician: Referring Thao Bauza: Benita Stabile Treating Malai Lady/Extender: STONE III, HOYT Weeks in Treatment: 3 Wound Status Wound Number: 1 Primary Pressure Ulcer Etiology: Wound Location: Left Malleolus - Lateral Wound Status: Open Wounding Event: Pressure Injury Comorbid Glaucoma, Hypertension, Osteoarthritis, Date Acquired: 07/18/2018 History: Dementia Weeks Of Treatment: 3 Clustered Wound: No Photos Wound Measurements Length: (cm) 1 % Reduction Width: (cm) 1 % Reduction Depth: (cm) 0.2 Epithelializ Area: (cm) 0.785 Tunneling: Volume: (cm) 0.157 Undermining in Area: -42.7% in Volume: -42.7% ation: None No : No Wound Description Classification: Category/Stage III Foul Odor A Wound Margin: Epibole Slough/Fibr Exudate Amount: Large Exudate Type: Serosanguineous Exudate Color: red, brown fter Cleansing: No ino Yes Wound Bed Granulation Amount: Small (1-33%) Exposed Structure Granulation Quality: Pale Fascia Exposed: No Necrotic Amount: Large (67-100%) Fat  Layer (Subcutaneous Tissue) Exposed: Yes Necrotic Quality: Adherent Slough Tendon Exposed: No Muscle Exposed: No Joint Exposed: No Bone Exposed: No Treatment Notes Blickenstaff, Nikaya A. ([id number]) Wound #1 (Left, Lateral Malleolus) Notes Prisma, wet to dry on ischium wth BFD, ABD and conform to right  malleolus. BFD midline sacrum and left heel Electronic Signature(s) Signed: 03/24/2019 4:15:06 PM By: Arnette Norris Entered By: Arnette Norris on 03/24/2019 14:40:18 Gelber, [place]ora A. ([id number]) -------------------------------------------------------------------------------- Wound Assessment Details Patient Name: [name] Lye A. Date of Service: 03/24/2019 2:30 PM Medical Record Number: [id number] Patient Account Number: [id number] Date of Birth/Sex: Dec 19, 1928 (85 y.o. F) Treating RN: Arnette Norris Primary Care Chloris Marcoux: Dewaine Oats Other Clinician: Referring Kellsey Sansone: Dewaine Oats Treating Lc Joynt/Extender: STONE III, HOYT Weeks in Treatment: 3 Wound Status Wound Number: 2 Primary Pressure Ulcer Etiology: Wound Location: Right Calcaneus - Medial Wound Status: Open Wounding Event: Pressure Injury Comorbid Glaucoma, Hypertension, Osteoarthritis, Date Acquired: 07/18/2018 History: Dementia Weeks Of Treatment: 3 Clustered Wound: No Photos Wound Measurements Length: (cm) 0.5 % Reduction i Width: (cm) 0.8 % Reduction i Depth: (cm) 0.1 Epithelializa Area: (cm) 0.314 Tunneling: Volume: (cm) 0.031 Undermining: n Area: 89.3% n Volume: 97.9% tion: None No No Wound Description Classification: Category/Stage III Foul Odor Af Wound Margin: Epibole Slough/Fibri Exudate Amount: Large Exudate Type: Serosanguineous Exudate Color: red, brown ter Cleansing: No no Yes Wound Bed Granulation Amount: None Present (0%) Exposed Structure Necrotic Amount: Small (1-33%) Fascia Exposed: No Necrotic Quality: Adherent Slough Fat Layer (Subcutaneous Tissue) Exposed: Yes Tendon Exposed:  No Muscle Exposed: No Joint Exposed: No Bone Exposed: No Treatment Notes Fulfer, Laurisa A. ([id number]) Wound #2 (Right, Medial Calcaneus) Notes Prisma, wet to dry on ischium wth BFD, ABD and conform to right malleolus. BFD midline sacrum and left heel Electronic Signature(s) Signed: 03/24/2019 4:15:06 PM By: Arnette Norris Entered By: Arnette Norris on 03/24/2019 14:40:45 Funnell, Mayzee A. ([id number]) -------------------------------------------------------------------------------- Wound Assessment Details Patient Name: [name] Lye A. Date of Service: 03/24/2019 2:30 PM Medical Record Number: [id number] Patient Account Number: [id number] Date of Birth/Sex: [birth year] (85 y.o. F) Treating RN: Arnette Norris Primary Care Sander Remedios: Dewaine Oats Other Clinician: Referring Romanita Fager: Dewaine Oats Treating Joia Doyle/Extender: STONE III, HOYT Weeks in Treatment: 3 Wound Status Wound Number: 3 Primary Pressure Ulcer Etiology: Wound Location: Left Ischium Wound Status: Open Wounding Event: Pressure Injury Comorbid Glaucoma, Hypertension, Osteoarthritis, Date Acquired: 07/18/2018 History: Dementia Weeks Of Treatment: 3 Clustered Wound: No Photos Wound Measurements Length: (cm) 2.1 % Reduction Width: (cm) 3.4 % Reduction Depth: (cm) 1.5 Epitheliali Area: (cm) 5.608 Underminin Volume: (cm) 8.412 Startin Ending P Maximum in Area: 51.4% in Volume: 66.9% zation: None g: Yes g Position (o'clock): 6 osition (o'clock): 10 Distance: (cm) 3 Wound Description Classification: Category/Stage IV Foul Odor Wound Margin: Thickened Slough/Fib Exudate Amount: Large Exudate Type: Serosanguineous Exudate Color: red, brown After Cleansing: No rino Yes Wound Bed Granulation Amount: None Present (0%) Exposed Structure Necrotic Amount: Large (67-100%) Fascia Exposed: No Necrotic Quality: Eschar, Adherent Slough Fat Layer (Subcutaneous Tissue) Exposed: Yes Tendon Exposed: No Muscle Exposed:  Yes Necrosis of Muscle: No Joint Exposed: No Larsen, Nasya A. ([id number]) Bone Exposed: No Treatment Notes Wound #3 (Left Ischium) Notes Prisma, wet to dry on ischium wth BFD, ABD and conform to right malleolus. BFD midline sacrum and left heel Electronic Signature(s) Signed: 03/24/2019 4:15:06 PM By: Arnette Norris Entered By: Arnette Norris on 03/24/2019 14:41:14 Mchan, Jalisha A. ([id number]) -------------------------------------------------------------------------------- Wound Assessment Details Patient Name: [name] Lye A. Date of Service: 03/24/2019 2:30 PM Medical Record Number: [id number] Patient Account Number: [id number] Date of Birth/Sex: [birth year] (85 y.o. F) Treating RN: Arnette Norris Primary Care Rebekkah Powless: Dewaine Oats Other Clinician: Referring Neeva Trew: TATE, Katherina Right Treating Cassady Turano/Extender: STONE III, HOYT Weeks in Treatment: 3 Wound Status Wound Number: 4 Primary Pressure Ulcer Etiology: Wound Location: Sacrum -  Midline Wound Status: Open Wounding Event: Pressure Injury Comorbid Glaucoma, Hypertension, Osteoarthritis, Date Acquired: 07/18/2018 History: Dementia Weeks Of Treatment: 3 Clustered Wound: Yes Photos Wound Measurements Length: (cm) 1 % Reducti Width: (cm) 0.6 % Reducti Depth: (cm) 0.1 Epithelia Clustered Quantity: 3 Tunneling Area: (cm) 0.471 Undermin Volume: (cm) 0.047 on in Area: 97.3% on in Volume: 97.3% lization: Medium (34-66%) : No ing: No Wound Description Classification: Category/Stage III Wound Margin: Indistinct, nonvisible Exudate Amount: Medium Exudate Type: Serous Exudate Color: amber Foul Odor After Cleansing: No Slough/Fibrino Yes Wound Bed Granulation Amount: Medium (34-66%) Exposed Structure Granulation Quality: Pink Fascia Exposed: No Necrotic Amount: Medium (34-66%) Fat Layer (Subcutaneous Tissue) Exposed: No Necrotic Quality: Adherent Slough Tendon Exposed: No Muscle Exposed: No Joint Exposed: No Bone Exposed:  No Limited to Skin Breakdown [place], Maryjean A. ([id number]) Treatment Notes Wound #4 (Midline Sacrum) Notes Prisma, wet to dry on ischium wth BFD, ABD and conform to right malleolus. BFD midline sacrum and left heel Electronic Signature(s) Signed: 03/24/2019 4:15:06 PM By: Arnette Norris Entered By: Arnette Norris on 03/24/2019 14:41:44 Lawley, Jillana A. ([id number]) -------------------------------------------------------------------------------- Vitals Details Patient Name: [name] Lye A. Date of Service: 03/24/2019 2:30 PM Medical Record Number: [id number] Patient Account Number: [id number] Date of Birth/Sex: 19-Jan-1929 (85 y.o. F) Treating RN: Rodell Perna Primary Care Sebasthian Stailey: Dewaine Oats Other Clinician: Referring Atha Mcbain: Dewaine Oats Treating Yaileen Hofferber/Extender: STONE III, HOYT Weeks in Treatment: 3 Vital Signs Time Taken: 14:20 Temperature (F): 98.7 Height (in): 67 Respiratory Rate (breaths/min): 16 Weight (lbs): 140 Blood Pressure (mmHg): 113/61 Body Mass Index (BMI): 21.9 Reference Range: 80 - 120 mg / dl Electronic Signature(s) Signed: 03/24/2019 4:57:32 PM By: Dayton Martes RCP, RRT, CHT Entered By: Dayton Martes on 03/24/2019 14:28:09

## 2019-04-07 ENCOUNTER — Other Ambulatory Visit: Payer: Self-pay

## 2019-04-07 ENCOUNTER — Encounter: Payer: Medicare Other | Admitting: Physician Assistant

## 2019-04-07 DIAGNOSIS — L89623 Pressure ulcer of left heel, stage 3: Secondary | ICD-10-CM | POA: Diagnosis not present

## 2019-04-07 NOTE — Progress Notes (Addendum)
SUKARI, GRIST ([id number]) Visit Report for 04/07/2019 Chief Complaint Document Details Patient Name: [name] [name], IMPASTATO A. Date of Service: 04/07/2019 3:15 PM Medical Record Number: [id number] Patient Account Number: [id number] Date of Birth/Sex: [birth year] (85 y.o. F) Treating RN: Rodell Perna Primary Care Provider: Dewaine Oats Other Clinician: Referring Provider: Dewaine Oats Treating Provider/Extender: Linwood Dibbles, HOYT Weeks in Treatment: 5 Information Obtained from: Patient Chief Complaint Multiple pressure ulcers Electronic Signature(s) Signed: 04/07/2019 3:45:55 PM By: Lenda Kelp PA-C Entered By: Lenda Kelp on 04/07/2019 15:45:54 Simons, [name] ([id number]) -------------------------------------------------------------------------------- HPI Details Patient Name: [name] [name] A. Date of Service: 04/07/2019 3:15 PM Medical Record Number: [id number] Patient Account Number: [id number] Date of Birth/Sex: 1929/03/20 (85 y.o. F) Treating RN: Rodell Perna Primary Care Provider: Dewaine Oats Other Clinician: Referring Provider: Dewaine Oats Treating Provider/Extender: Linwood Dibbles, HOYT Weeks in Treatment: 5 History of Present Illness HPI Description: 03/03/2019 on evaluation today patient presents for initial evaluation here in the clinic today concerning issues that she has been having with wounds which are pressure in nature at multiple locations currently. This is on her left heel, right ankle, left hip/ischial location, and all her to varying degrees of severity and depth. The worst is on the left ischial location. At this time the patient has been tolerating dressing changes although I am not exactly sure the dressings that have been utilized prior to coming in today. She does have some necrotic tissue noted at several locations this can require at least some degree of cleaning prior to application of dressings going forward. The patient does have dementia and unfortunately in recent  months has become increasingly immobile requiring more significant treatment she was just recently moved roughly 2 weeks ago from assisted living to a skilled nursing facility at [facility]. Based on what I am seeing currently these pressure injuries occurred prior to that 2-week time I do not see any new or obvious pressure injury at this point. I discussed with the patient as well as her family member that this does appear to be doing better in my opinion than likely where things started. They are unsure as to whether or not she has an air mattress at the facility I do think that would be something that would be appropriate and good for her to have but at the moment I do not know for sure whether that is already in place if not that definitely is can be 1 of the recommendations. Also think a Roho cushion for her wheelchair would be good as well as Prevalon offloading boots. The patient currently does not have any severe pain although when I was cleaning some of the areas she did note there was some discomfort at several of the locations. She does have a history of hypertension as well. 10/29; this is a patient from [facility] nursing home. She is nonambulatory. She is here for follow-up on 4 different wounds 1 over the left lateral malleolus, the right medial calcaneus, the left posterior hip and I believe a new area over the sacrum. The patient is nonambulatory but apparently eats well. 03/24/2019 on evaluation today patient actually appears to be doing better with regard to her wounds in general based on what I am seeing. Fortunately there is no signs of active infection at this time. No fever chills noted. Overall been very pleased with the progress that seems to be occurring since I last saw her. There is a little bit of debridement That will need to be performed  today. 04/07/2019 on evaluation today patient actually appears to be doing well in some regards based on what I am seeing  today. Fortunately there is no signs of active infection at this time which is good news. No fever chills noted. With that being said the wounds on her foot and ankle region in general have healed at this point. I do not see anything open in that region. With regard to the other 2 wounds in the left ischium and midline sacral region I believe that we may need to make a dressing change at this point to do something a little bit more conducive to cleaning up the wound bed. The patient is in agreement with that plan today. Electronic Signature(s) Signed: 04/07/2019 4:04:50 PM By: Worthy Keeler PA-C Entered By: Worthy Keeler on 04/07/2019 16:04:49 Susa Griffins ([id number]) -------------------------------------------------------------------------------- Physical Exam Details Patient Name: [name] [name] A. Date of Service: 04/07/2019 3:15 PM Medical Record Number: [id number] Patient Account Number: [id number] Date of Birth/Sex: 1929-02-26 (85 y.o. F) Treating RN: Army Melia Primary Care Provider: Benita Stabile Other Clinician: Referring Provider: TATE, Sharlet Salina Treating Provider/Extender: STONE III, HOYT Weeks in Treatment: 5 Constitutional Well-nourished and well-hydrated in no acute distress. Respiratory normal breathing without difficulty. clear to auscultation bilaterally. Cardiovascular regular rate and rhythm with normal S1, S2. Psychiatric this patient is able to make decisions and demonstrates good insight into disease process. Alert and Oriented x 3. pleasant and cooperative. Notes Upon inspection today patient's wound actually did need a little bit of debridement in regard to the sacral region but unfortunately she was having a lot of pain and I really did not feel like that would be in her best interest at this point based on what she was telling me. For that reason I discussed changing her dressings to a Dakin moistened gauze dressing which I think will be much more appropriate  help clean the area up without causing as much discomfort and she is in agreement with that plan. Electronic Signature(s) Signed: 04/07/2019 4:05:28 PM By: Worthy Keeler PA-C Entered By: Worthy Keeler on 04/07/2019 16:05:28 Susa Griffins ([id number]) -------------------------------------------------------------------------------- Physician Orders Details Patient Name: [name] [name] A. Date of Service: 04/07/2019 3:15 PM Medical Record Number: [id number] Patient Account Number: [id number] Date of Birth/Sex: 1928-06-14 (85 y.o. F) Treating RN: Army Melia Primary Care Provider: Benita Stabile Other Clinician: Referring Provider: Benita Stabile Treating Provider/Extender: Melburn Hake, HOYT Weeks in Treatment: 5 Verbal / Phone Orders: No Diagnosis Coding ICD-10 Coding Code Description [phone] Pressure ulcer of left heel, stage 3 L89.513 Pressure ulcer of right ankle, stage 3 L89.224 Pressure ulcer of left hip, stage 4 L89.153 Pressure ulcer of sacral region, stage 3 I10 Essential (primary) hypertension F01.50 Vascular dementia without behavioral disturbance Wound Cleansing Wound #3 Left Ischium o Clean wound with Normal Saline. o Cleanse wound with mild soap and water Wound #4 Midline Sacrum o Clean wound with Normal Saline. o Cleanse wound with mild soap and water Anesthetic (add to Medication List) Wound #3 Left Ischium o Topical Lidocaine 4% cream applied to wound bed prior to debridement (In Clinic Only). Wound #4 Midline Sacrum o Topical Lidocaine 4% cream applied to wound bed prior to debridement (In Clinic Only). Primary Wound Dressing Wound #3 Left Ischium o Other: - Dakins moistened gauze packed Wound #4 Midline Sacrum o Other: - Dakins moistened gauze packed Secondary Dressing Wound #3 Left Ischium o ABD pad Wound #4 Midline Sacrum o ABD pad Dressing Change Frequency Wound #3 Left  Ischium Hallam, Milee A. ([id number]) o Change dressing every  day. Wound #4 Midline Sacrum o Change dressing every day. Follow-up Appointments Wound #3 Left Ischium o Return Appointment in 2 weeks. Wound #4 Midline Sacrum o Return Appointment in 2 weeks. Off-Loading Wound #3 Left Ischium o Heel suspension boot to: o Mattress - Air mattress o Turn and reposition every 2 hours Wound #4 Midline Sacrum o Heel suspension boot to: o Mattress [employer]- Air mattress o Turn and reposition every 2 hours Electronic Signature(s) Signed: 04/08/2019 8:45:19 AM By: Rodell PernaScott, Dajea Signed: 04/08/2019 1:53:04 PM By: Lenda KelpStone III, Hoyt PA-C Entered By: Rodell PernaScott, Dajea on 04/07/2019 16:02:30 Berrios, Marylouise StacksWILMA A. ([id number]) -------------------------------------------------------------------------------- Problem List Details Patient Name: [name] LyeHALL, Chayla A. Date of Service: 04/07/2019 3:15 PM Medical Record Number: [id number] Patient Account Number: [id number] Date of Birth/Sex: 09/01/1928 (85 y.o. F) Treating RN: Rodell PernaScott, Dajea Primary Care Provider: Dewaine OatsATE, DENNY Other Clinician: Referring Provider: Dewaine OatsATE, DENNY Treating Provider/Extender: Linwood DibblesSTONE III, HOYT Weeks in Treatment: 5 Active Problems ICD-10 Evaluated Encounter Code Description Active Date Today Diagnosis L89.623 Pressure ulcer of left heel, stage 3 03/03/2019 No Yes L89.513 Pressure ulcer of right ankle, stage 3 03/03/2019 No Yes L89.224 Pressure ulcer of left hip, stage 4 03/03/2019 No Yes L89.153 Pressure ulcer of sacral region, stage 3 03/24/2019 No Yes I10 Essential (primary) hypertension 03/03/2019 No Yes F01.50 Vascular dementia without behavioral disturbance 03/03/2019 No Yes Inactive Problems Resolved Problems Electronic Signature(s) Signed: 04/07/2019 3:45:45 PM By: Lenda KelpStone III, Hoyt PA-C Entered By: Lenda KelpStone III, Hoyt on 04/07/2019 15:45:44 Co, Saralee A. ([id number]) -------------------------------------------------------------------------------- Progress Note Details Patient Name: [name] LyeHALL, Jannessa  A. Date of Service: 04/07/2019 3:15 PM Medical Record Number: [id number] Patient Account Number: [id number] Date of Birth/Sex: 01/07/1929 (85 y.o. F) Treating RN: Rodell PernaScott, Dajea Primary Care Provider: Dewaine OatsATE, DENNY Other Clinician: Referring Provider: Dewaine OatsATE, DENNY Treating Provider/Extender: Linwood DibblesSTONE III, HOYT Weeks in Treatment: 5 Subjective Chief Complaint Information obtained from Patient Multiple pressure ulcers History of Present Illness (HPI) 03/03/2019 on evaluation today patient presents for initial evaluation here in the clinic today concerning issues that she has been having with wounds which are pressure in nature at multiple locations currently. This is on her left heel, right ankle, left hip/ischial location, and all her to varying degrees of severity and depth. The worst is on the left ischial location. At this time the patient has been tolerating dressing changes although I am not exactly sure the dressings that have been utilized prior to coming in today. She does have some necrotic tissue noted at several locations this can require at least some degree of cleaning prior to application of dressings going forward. The patient does have dementia and unfortunately in recent months has become increasingly immobile requiring more significant treatment she was just recently moved roughly 2 weeks ago from assisted living to a skilled nursing facility at [facility]. Based on what I am seeing currently these pressure injuries occurred prior to that 2-week time I do not see any new or obvious pressure injury at this point. I discussed with the patient as well as her family member that this does appear to be doing better in my opinion than likely where things started. They are unsure as to whether or not she has an air mattress at the facility I do think that would be something that would be appropriate and good for her to have but at the moment I do not know for sure whether that is already  in place if not that definitely is can  be 1 of the recommendations. Also think a Roho cushion for her wheelchair would be good as well as Prevalon offloading boots. The patient currently does not have any severe pain although when I was cleaning some of the areas she did note there was some discomfort at several of the locations. She does have a history of hypertension as well. 10/29; this is a patient from [facility] nursing home. She is nonambulatory. She is here for follow-up on 4 different wounds 1 over the left lateral malleolus, the right medial calcaneus, the left posterior hip and I believe a new area over the sacrum. The patient is nonambulatory but apparently eats well. 03/24/2019 on evaluation today patient actually appears to be doing better with regard to her wounds in general based on what I am seeing. Fortunately there is no signs of active infection at this time. No fever chills noted. Overall been very pleased with the progress that seems to be occurring since I last saw her. There is a little bit of debridement That will need to be performed today. 04/07/2019 on evaluation today patient actually appears to be doing well in some regards based on what I am seeing today. Fortunately there is no signs of active infection at this time which is good news. No fever chills noted. With that being said the wounds on her foot and ankle region in general have healed at this point. I do not see anything open in that region. With regard to the other 2 wounds in the left ischium and midline sacral region I believe that we may need to make a dressing change at this point to do something a little bit more conducive to cleaning up the wound bed. The patient is in agreement with that plan today. Patient History Unable to Obtain Patient History due to Dementia. Information obtained from Patient. Social History Never smoker. Medical History BRITENY, FULGHUM ([id number]) Eyes Patient has  history of Glaucoma Denies history of Cataracts, Optic Neuritis Ear/Nose/Mouth/Throat Denies history of Chronic sinus problems/congestion, Middle ear problems Hematologic/Lymphatic Denies history of Anemia, Hemophilia, Human Immunodeficiency Virus, Lymphedema, Sickle Cell Disease Respiratory Denies history of Aspiration, Asthma, Chronic Obstructive Pulmonary Disease (COPD), Pneumothorax, Sleep Apnea, Tuberculosis Cardiovascular Patient has history of Hypertension Denies history of Angina, Arrhythmia, Congestive Heart Failure, Coronary Artery Disease, Deep Vein Thrombosis, Hypotension, Myocardial Infarction, Peripheral Arterial Disease, Peripheral Venous Disease, Phlebitis, Vasculitis Gastrointestinal Denies history of Cirrhosis , Colitis, Crohn s, Hepatitis A, Hepatitis B, Hepatitis C Endocrine Denies history of Type I Diabetes, Type II Diabetes Genitourinary Denies history of End Stage Renal Disease Immunological Denies history of Lupus Erythematosus, Raynaud s, Scleroderma Integumentary (Skin) Denies history of History of Burn, History of pressure wounds Musculoskeletal Patient has history of Osteoarthritis Denies history of Gout, Rheumatoid Arthritis, Osteomyelitis Neurologic Patient has history of Dementia Denies history of Neuropathy, Quadriplegia, Paraplegia, Seizure Disorder Oncologic Denies history of Received Chemotherapy, Received Radiation Psychiatric Denies history of Anorexia/bulimia, Confinement Anxiety Review of Systems (ROS) Constitutional Symptoms (General Health) Denies complaints or symptoms of Fatigue, Fever, Chills, Marked Weight Change. Respiratory Denies complaints or symptoms of Chronic or frequent coughs, Shortness of Breath. Cardiovascular Denies complaints or symptoms of Chest pain, LE edema. Psychiatric Denies complaints or symptoms of Anxiety, Claustrophobia. Objective Constitutional Well-nourished and well-hydrated in no acute  distress. Vitals Time Taken: 3:30 PM, Height: 67 in, Weight: 140 lbs, BMI: 21.9, Temperature: 99.6 F, Pulse: 73 bpm, Respiratory Meras, Lynnleigh A. ([id number]) Rate: 16 breaths/min, Blood Pressure: 103/51 mmHg. Respiratory normal breathing  without difficulty. clear to auscultation bilaterally. Cardiovascular regular rate and rhythm with normal S1, S2. Psychiatric this patient is able to make decisions and demonstrates good insight into disease process. Alert and Oriented x 3. pleasant and cooperative. General Notes: Upon inspection today patient's wound actually did need a little bit of debridement in regard to the sacral region but unfortunately she was having a lot of pain and I really did not feel like that would be in her best interest at this point based on what she was telling me. For that reason I discussed changing her dressings to a Dakin moistened gauze dressing which I think will be much more appropriate help clean the area up without causing as much discomfort and she is in agreement with that plan. Integumentary (Hair, Skin) Wound #1 status is Open. Original cause of wound was Pressure Injury. The wound is located on the Left,Lateral Malleolus. The wound measures 0cm length x 0cm width x 0cm depth; 0cm^2 area and 0cm^3 volume. There is Fat Layer (Subcutaneous Tissue) Exposed exposed. There is no tunneling or undermining noted. There is a none present amount of drainage noted. The wound margin is epibole. There is no granulation within the wound bed. There is no necrotic tissue within the wound bed. Wound #2 status is Open. Original cause of wound was Pressure Injury. The wound is located on the Right,Medial Calcaneus. The wound measures 0cm length x 0cm width x 0cm depth; 0cm^2 area and 0cm^3 volume. There is Fat Layer (Subcutaneous Tissue) Exposed exposed. There is no tunneling or undermining noted. There is a none present amount of drainage noted. The wound margin is epibole.  There is no granulation within the wound bed. There is no necrotic tissue within the wound bed. Wound #3 status is Open. Original cause of wound was Pressure Injury. The wound is located on the Left Ischium. The wound measures 1.7cm length x 2.2cm width x 1.3cm depth; 2.937cm^2 area and 3.819cm^3 volume. There is muscle and Fat Layer (Subcutaneous Tissue) Exposed exposed. There is no undermining noted, however, there is tunneling at 8:00 with a maximum distance of 2.2cm. There is a large amount of purulent drainage noted. The wound margin is thickened. There is medium (34-66%) pink granulation within the wound bed. There is a medium (34-66%) amount of necrotic tissue within the wound bed including Adherent Slough. Wound #4 status is Open. Original cause of wound was Pressure Injury. The wound is located on the Midline Sacrum. The wound measures 2cm length x 0.8cm width x 1.1cm depth; 1.257cm^2 area and 1.382cm^3 volume. There is Fat Layer (Subcutaneous Tissue) Exposed exposed. There is undermining starting at 8:00 and ending at 1:00 with a maximum distance of 1cm. There is a medium amount of purulent drainage noted. The wound margin is indistinct and nonvisible. There is no granulation within the wound bed. There is a large (67-100%) amount of necrotic tissue within the wound bed including Adherent Slough. Assessment Active Problems ICD-10 Pressure ulcer of left heel, stage 3 Pressure ulcer of right ankle, stage 3 Pressure ulcer of left hip, stage 4 Pressure ulcer of sacral region, stage 3 Coutts, Briggett A. ([id number]) Essential (primary) hypertension Vascular dementia without behavioral disturbance Plan Wound Cleansing: Wound #3 Left Ischium: Clean wound with Normal Saline. Cleanse wound with mild soap and water Wound #4 Midline Sacrum: Clean wound with Normal Saline. Cleanse wound with mild soap and water Anesthetic (add to Medication List): Wound #3 Left Ischium: Topical Lidocaine  4% cream applied to wound bed  prior to debridement (In Clinic Only). Wound #4 Midline Sacrum: Topical Lidocaine 4% cream applied to wound bed prior to debridement (In Clinic Only). Primary Wound Dressing: Wound #3 Left Ischium: Other: - Dakins moistened gauze packed Wound #4 Midline Sacrum: Other: - Dakins moistened gauze packed Secondary Dressing: Wound #3 Left Ischium: ABD pad Wound #4 Midline Sacrum: ABD pad Dressing Change Frequency: Wound #3 Left Ischium: Change dressing every day. Wound #4 Midline Sacrum: Change dressing every day. Follow-up Appointments: Wound #3 Left Ischium: Return Appointment in 2 weeks. Wound #4 Midline Sacrum: Return Appointment in 2 weeks. Off-Loading: Wound #3 Left Ischium: Heel suspension boot to: Mattress - Air mattress Turn and reposition every 2 hours Wound #4 Midline Sacrum: Heel suspension boot to: Mattress - Air mattress Turn and reposition every 2 hours 1. My suggestion at this time is good to be that we go ahead and switch to the Dakin's moistened gauze dressing to be packed into the 2 remaining wounds and the issue and sacral region. 2. I am also going to recommend that we discontinue treatment with regard to the foot and ankle region she does still need to continue to wear the Prevalon offloading boots. Renbarger, Jensen A. ([id number]) 3. She still continues to require the air mattress as well as turning and repositioning every 2 hours which I think is good to be of utmost importance as far as helping the wounds to continue to heal. We will see patient back for reevaluation in 2 weeks here in the clinic. If anything worsens or changes patient will contact our office for additional recommendations. Electronic Signature(s) Signed: 04/07/2019 4:06:18 PM By: Lenda Kelp PA-C Entered By: Lenda Kelp on 04/07/2019 16:06:17 [name]  ([id number]) -------------------------------------------------------------------------------- ROS/PFSH Details Patient Name: [name] [name] A. Date of Service: 04/07/2019 3:15 PM Medical Record Number: [id number] Patient Account Number: [id number] Date of Birth/Sex: Jan 24, 1929 (85 y.o. F) Treating RN: Rodell Perna Primary Care Provider: Dewaine Oats Other Clinician: Referring Provider: Dewaine Oats Treating Provider/Extender: STONE III, HOYT Weeks in Treatment: 5 Unable to Obtain Patient History due to oo Dementia Information Obtained From Patient Constitutional Symptoms (General Health) Complaints and Symptoms: Negative for: Fatigue; Fever; Chills; Marked Weight Change Respiratory Complaints and Symptoms: Negative for: Chronic or frequent coughs; Shortness of Breath Medical History: Negative for: Aspiration; Asthma; Chronic Obstructive Pulmonary Disease (COPD); Pneumothorax; Sleep Apnea; Tuberculosis Cardiovascular Complaints and Symptoms: Negative for: Chest pain; LE edema Medical History: Positive for: Hypertension Negative for: Angina; Arrhythmia; Congestive Heart Failure; Coronary Artery Disease; Deep Vein Thrombosis; Hypotension; Myocardial Infarction; Peripheral Arterial Disease; Peripheral Venous Disease; Phlebitis; Vasculitis Psychiatric Complaints and Symptoms: Negative for: Anxiety; Claustrophobia Medical History: Negative for: Anorexia/bulimia; Confinement Anxiety Eyes Medical History: Positive for: Glaucoma Negative for: Cataracts; Optic Neuritis Ear/Nose/Mouth/Throat Medical History: Negative for: Chronic sinus problems/congestion; Middle ear problems Hematologic/Lymphatic HULDAH, MARIN A. ([id number]) Medical History: Negative for: Anemia; Hemophilia; Human Immunodeficiency Virus; Lymphedema; Sickle Cell Disease Gastrointestinal Medical History: Negative for: Cirrhosis ; Colitis; Crohnos; Hepatitis A; Hepatitis B; Hepatitis C Endocrine Medical History: Negative  for: Type I Diabetes; Type II Diabetes Genitourinary Medical History: Negative for: End Stage Renal Disease Immunological Medical History: Negative for: Lupus Erythematosus; Raynaudos; Scleroderma Integumentary (Skin) Medical History: Negative for: History of Burn; History of pressure wounds Musculoskeletal Medical History: Positive for: Osteoarthritis Negative for: Gout; Rheumatoid Arthritis; Osteomyelitis Neurologic Medical History: Positive for: Dementia Negative for: Neuropathy; Quadriplegia; Paraplegia; Seizure Disorder Oncologic Medical History: Negative for: Received Chemotherapy; Received Radiation HBO Extended History Items Eyes: Glaucoma Immunizations Pneumococcal  Vaccine: Received Pneumococcal Vaccination: No Implantable Devices None Family and Social History Never smoker LYNNAE, LUDEMANN ([id number]) Physician Affirmation I have reviewed and agree with the above information. Electronic Signature(s) Signed: 04/08/2019 8:45:19 AM By: Rodell Perna Signed: 04/08/2019 1:53:04 PM By: Lenda Kelp PA-C Entered By: Lenda Kelp on 04/07/2019 16:05:10 [name], [name] ([id number]) -------------------------------------------------------------------------------- SuperBill Details Patient Name: [name] [name] A. Date of Service: 04/07/2019 Medical Record Number: [id number] Patient Account Number: [id number] Date of Birth/Sex: 17-Dec-1928 (85 y.o. F) Treating RN: Rodell Perna Primary Care Provider: Dewaine Oats Other Clinician: Referring Provider: Dewaine Oats Treating Provider/Extender: Linwood Dibbles, HOYT Weeks in Treatment: 5 Diagnosis Coding ICD-10 Codes Code Description [phone] Pressure ulcer of left heel, stage 3 L89.513 Pressure ulcer of right ankle, stage 3 L89.224 Pressure ulcer of left hip, stage 4 L89.153 Pressure ulcer of sacral region, stage 3 I10 Essential (primary) hypertension F01.50 Vascular dementia without behavioral disturbance Facility  Procedures CPT4 Code: [id number] Description: 99214 - WOUND CARE VISIT-LEV 4 EST PT Modifier: Quantity: 1 Physician Procedures CPT4 Code: [id number] Description: 99214 - WC PHYS LEVEL 4 - EST PT ICD-10 Diagnosis Description L89.623 Pressure ulcer of left heel, stage 3 L89.513 Pressure ulcer of right ankle, stage 3 L89.224 Pressure ulcer of left hip, stage 4 L89.153 Pressure ulcer of sacral region,  stage 3 Modifier: Quantity: 1 Electronic Signature(s) Signed: 04/07/2019 4:06:33 PM By: Lenda Kelp PA-C Entered By: Lenda Kelp on 04/07/2019 16:06:33

## 2019-04-21 ENCOUNTER — Other Ambulatory Visit: Payer: Self-pay

## 2019-04-21 ENCOUNTER — Encounter: Payer: Medicare Other | Attending: Physician Assistant | Admitting: Physician Assistant

## 2019-04-21 DIAGNOSIS — L89224 Pressure ulcer of left hip, stage 4: Secondary | ICD-10-CM | POA: Insufficient documentation

## 2019-04-21 DIAGNOSIS — F0150 Vascular dementia without behavioral disturbance: Secondary | ICD-10-CM | POA: Insufficient documentation

## 2019-04-21 DIAGNOSIS — M1990 Unspecified osteoarthritis, unspecified site: Secondary | ICD-10-CM | POA: Diagnosis not present

## 2019-04-21 DIAGNOSIS — L89153 Pressure ulcer of sacral region, stage 3: Secondary | ICD-10-CM | POA: Insufficient documentation

## 2019-04-21 DIAGNOSIS — L89513 Pressure ulcer of right ankle, stage 3: Secondary | ICD-10-CM | POA: Insufficient documentation

## 2019-04-21 DIAGNOSIS — I10 Essential (primary) hypertension: Secondary | ICD-10-CM | POA: Insufficient documentation

## 2019-04-21 DIAGNOSIS — L8990 Pressure ulcer of unspecified site, unspecified stage: Secondary | ICD-10-CM | POA: Diagnosis present

## 2019-04-21 DIAGNOSIS — L89623 Pressure ulcer of left heel, stage 3: Secondary | ICD-10-CM | POA: Diagnosis not present

## 2019-04-21 NOTE — Progress Notes (Addendum)
[name], [name] ([id number]) Visit Report for 04/21/2019 Arrival Information Details Patient Name: [name] [name], [name] Date of Service: 04/21/2019 9:15 AM Medical Record Number: [id number] Patient Account Number: [id number] Date of Birth/Sex: [birth year] (85 y.o. F) Treating RN: Cornell Barman Primary Care Jehan Bonano: Benita Stabile Other Clinician: Referring Skip Litke: Benita Stabile Treating Adriahna Shearman/Extender: Melburn Hake, HOYT Weeks in Treatment: 7 Visit Information History Since Last Visit Pain Present Now: Unable to Respond Patient Arrived: Wheel Chair Arrival Time: 09:27 Accompanied By: self Transfer Assistance: Harrel Lemon Lift Patient Identification Verified: Yes Secondary Verification Process Completed: Yes Electronic Signature(s) Signed: 04/21/2019 5:32:08 PM By: Gretta Cool, BSN, RN, CWS, Kim RN, BSN Entered By: Gretta Cool, BSN, RN, CWS, Kim on 04/21/2019 09:27:32 [name] [name] ([id number]) -------------------------------------------------------------------------------- Clinic Level of Care Assessment Details Patient Name: [name] Roan A. Date of Service: 04/21/2019 9:15 AM Medical Record Number: [id number] Patient Account Number: [id number] Date of Birth/Sex: Jan 07, 1929 (85 y.o. F) Treating RN: Army Melia Primary Care Lynnette Pote: Benita Stabile Other Clinician: Referring Verl Kitson: Benita Stabile Treating Kasch Borquez/Extender: Melburn Hake, HOYT Weeks in Treatment: 7 Clinic Level of Care Assessment Items TOOL 4 Quantity Score []  - Use when only an EandM is performed on FOLLOW-UP visit 0 ASSESSMENTS - Nursing Assessment / Reassessment X - Reassessment of Co-morbidities (includes updates in patient status) 1 10 X- 1 5 Reassessment of Adherence to Treatment Plan ASSESSMENTS - Wound and Skin Assessment / Reassessment []  - Simple Wound Assessment / Reassessment - one wound 0 X- 2 5 Complex Wound Assessment / Reassessment - multiple wounds []  - 0 Dermatologic / Skin Assessment (not related to wound area) ASSESSMENTS - Focused  Assessment []  - Circumferential Edema Measurements - multi extremities 0 []  - 0 Nutritional Assessment / Counseling / Intervention []  - 0 Lower Extremity Assessment (monofilament, tuning fork, pulses) []  - 0 Peripheral Arterial Disease Assessment (using hand held doppler) ASSESSMENTS - Ostomy and/or Continence Assessment and Care []  - Incontinence Assessment and Management 0 []  - 0 Ostomy Care Assessment and Management (repouching, etc.) PROCESS - Coordination of Care X - Simple Patient / Family Education for ongoing care 1 15 []  - 0 Complex (extensive) Patient / Family Education for ongoing care X- 1 10 Staff obtains Programmer, systems, Records, Test Results / Process Orders X- 1 10 Staff telephones HHA, Nursing Homes / Clarify orders / etc []  - 0 Routine Transfer to another Facility (non-emergent condition) []  - 0 Routine Hospital Admission (non-emergent condition) []  - 0 New Admissions / Biomedical engineer / Ordering NPWT, Apligraf, etc. []  - 0 Emergency Hospital Admission (emergent condition) []  - 0 Simple Discharge Coordination [name], [name] ([id number]) []  - 0 Complex (extensive) Discharge Coordination PROCESS - Special Needs []  - Pediatric / Minor Patient Management 0 []  - 0 Isolation Patient Management []  - 0 Hearing / Language / Visual special needs []  - 0 Assessment of Community assistance (transportation, D/C planning, etc.) []  - 0 Additional assistance / Altered mentation []  - 0 Support Surface(s) Assessment (bed, cushion, seat, etc.) INTERVENTIONS - Wound Cleansing / Measurement []  - Simple Wound Cleansing - one wound 0 X- 2 5 Complex Wound Cleansing - multiple wounds X- 1 5 Wound Imaging (photographs - any number of wounds) []  - 0 Wound Tracing (instead of photographs) []  - 0 Simple Wound Measurement - one wound X- 2 5 Complex Wound Measurement - multiple wounds INTERVENTIONS - Wound Dressings []  - Small Wound Dressing one or multiple wounds 0 X- 2  15 Medium Wound Dressing one or multiple wounds []  - 0 Large Wound  Dressing one or multiple wounds  - 0 Application of Medications - topical  - 0 Application of Medications - injection INTERVENTIONS - Miscellaneous  - External ear exam 0  - 0 Specimen Collection (cultures, biopsies, blood, body fluids, etc.)  - 0 Specimen(s) / Culture(s) sent or taken to Lab for analysis  - 0 Patient Transfer (multiple staff / Nurse, adult / Similar devices)  - 0 Simple Staple / Suture removal (25 or less)  - 0 Complex Staple / Suture removal (26 or more)  - 0 Hypo / Hyperglycemic Management (close monitor of Blood Glucose)  - 0 Ankle / Brachial Index (ABI) - do not check if billed separately X- 1 5 Vital Signs [name], [name] ([id number]) Has the patient been seen at the hospital within the last three years: Yes Total Score: 120 Level Of Care: New/Established - Level 4 Electronic Signature(s) Signed: 04/21/2019 12:37:56 PM By: Rodell Perna Entered By: Rodell Perna on 04/21/2019 09:53:58 [name] [name], [name] ([id number]) -------------------------------------------------------------------------------- Encounter Discharge Information Details Patient Name: [name] Lye A. Date of Service: 04/21/2019 9:15 AM Medical Record Number: [id number] Patient Account Number: [id number] Date of Birth/Sex: [birth year] (85 y.o. F) Treating RN: Rodell Perna Primary Care Holt Woolbright: Dewaine Oats Other Clinician: Referring Nakiya Rallis: Dewaine Oats Treating Kayler Rise/Extender: Linwood Dibbles, HOYT Weeks in Treatment: 7 Encounter Discharge Information Items Discharge Condition: Stable Ambulatory Status: Wheelchair Discharge Destination: Skilled Nursing Facility Telephoned: No Orders Sent: Yes Transportation: Private Auto Accompanied By: self Schedule Follow-up Appointment: Yes Clinical Summary of Care: Electronic Signature(s) Signed: 04/21/2019 12:37:56 PM By: Rodell Perna Entered By: Rodell Perna on  04/21/2019 09:55:03 [name] [name], [name] ([id number]) -------------------------------------------------------------------------------- Lower Extremity Assessment Details Patient Name: [name] Lye A. Date of Service: 04/21/2019 9:15 AM Medical Record Number: [id number] Patient Account Number: [id number] Date of Birth/Sex: 11-May-1929 (85 y.o. F) Treating RN: Huel Coventry Primary Care Jamerion Cabello: Dewaine Oats Other Clinician: Referring Jaylene Schrom: Dewaine Oats Treating Jackson Coffield/Extender: Skeet Simmer in Treatment: 7 Electronic Signature(s) Signed: 04/21/2019 5:32:08 PM By: Elliot Gurney, BSN, RN, CWS, Kim RN, BSN Entered By: Elliot Gurney, BSN, RN, CWS, Kim on 04/21/2019 09:38:59 [name] [name] ([id number]) -------------------------------------------------------------------------------- Multi Wound Chart Details Patient Name: [name] Lye A. Date of Service: 04/21/2019 9:15 AM Medical Record Number: [id number] Patient Account Number: [id number] Date of Birth/Sex: Sep 27, 1928 (85 y.o. F) Treating RN: Rodell Perna Primary Care Ria Redcay: Dewaine Oats Other Clinician: Referring Dominick Morella: Dewaine Oats Treating Darrol Brandenburg/Extender: STONE III, HOYT Weeks in Treatment: 7 Vital Signs Height(in): 67 Pulse(bpm): 99 Weight(lbs): 140 Blood Pressure(mmHg): 127/73 Body Mass Index(BMI): 22 Temperature(F): 98.9 Respiratory Rate 16 (breaths/min): Photos: [N/A:N/A] Wound Location: Left Ischium Sacrum - Midline N/A Wounding Event: Pressure Injury Pressure Injury N/A Primary Etiology: Pressure Ulcer Pressure Ulcer N/A Comorbid History: Glaucoma, Hypertension, Glaucoma, Hypertension, N/A Osteoarthritis, Dementia Osteoarthritis, Dementia Date Acquired: 07/18/2018 07/18/2018 N/A Weeks of Treatment: 7 7 N/A Wound Status: Open Open N/A Clustered Wound: No Yes N/A Clustered Quantity: N/A 3 N/A Measurements L x W x D 0.5x1x0.3 2x1x2 N/A (cm) Area (cm) : 0.393 1.571 N/A Volume (cm) : 0.118 3.142 N/A % Reduction in Area: 96.60%  90.90% N/A % Reduction in Volume: 99.50% -81.80% N/A Position 1 (o'clock): 8 Maximum Distance 1 (cm): 2.6 Starting Position 1 9 (o'clock): Ending Position 1 2 (o'clock): Maximum Distance 1 (cm): 2 Tunneling: Yes No N/A Undermining: No Yes N/A Classification: Category/Stage IV Category/Stage III N/A Exudate Amount: Large Medium N/A Exudate Type: Purulent Purulent N/A Exudate Color: yellow, brown, green yellow, brown, green N/A [name] [name], [name] ([id number]) Wound Margin: Thickened  Indistinct, nonvisible N/A Granulation Amount: Medium (34-66%) None Present (0%) N/A Granulation Quality: Pink N/A N/A Necrotic Amount: Medium (34-66%) Large (67-100%) N/A Exposed Structures: Fat Layer (Subcutaneous Fat Layer (Subcutaneous N/A Tissue) Exposed: Yes Tissue) Exposed: Yes Muscle: Yes Fascia: No Fascia: No Tendon: No Tendon: No Muscle: No Joint: No Joint: No Bone: No Bone: No Epithelialization: None Medium (34-66%) N/A Treatment Notes Electronic Signature(s) Signed: 04/21/2019 12:37:56 PM By: Rodell PernaScott, Dajea Entered By: Rodell PernaScott, Dajea on 04/21/2019 09:50:32 [name] [name], [name] IshiharaWILMA A. ([id number]) -------------------------------------------------------------------------------- Multi-Disciplinary Care Plan Details Patient Name: [name] LyeHALL, Janiylah A. Date of Service: 04/21/2019 9:15 AM Medical Record Number: [id number] Patient Account Number: [id number] Date of Birth/Sex: 05/23/1928 (85 y.o. F) Treating RN: Rodell PernaScott, Dajea Primary Care Brinn Westby: Dewaine OatsATE, DENNY Other Clinician: Referring Irania Durell: Dewaine OatsATE, DENNY Treating Sajjad Honea/Extender: Linwood DibblesSTONE III, HOYT Weeks in Treatment: 7 Active Inactive Abuse / Safety / Falls / Self Care Management Nursing Diagnoses: Potential for falls Goals: Patient will remain injury free related to falls Date Initiated: 03/03/2019 Target Resolution Date: 03/16/2019 Goal Status: Active Interventions: Assess fall risk on admission and as needed Notes: Medication Nursing  Diagnoses: Knowledge deficit related to medication safety: actual or potential Goals: Patient/caregiver will demonstrate understanding of all current medications Date Initiated: 03/03/2019 Target Resolution Date: 03/16/2019 Goal Status: Active Interventions: Assess for medication contraindications each visit where new medications are prescribed Treatment Activities: New medication prescribed at Wound Center : 03/03/2019 Notes: Necrotic Tissue Nursing Diagnoses: Impaired tissue integrity related to necrotic/devitalized tissue Goals: Necrotic/devitalized tissue will be minimized in the wound bed Date Initiated: 03/03/2019 Target Resolution Date: 03/16/2019 Goal Status: Active [name] LyeHALL, Sadey A. ([id number]) Interventions: Assess patient pain level pre-, during and post procedure and prior to discharge Treatment Activities: Apply topical anesthetic as ordered : 03/03/2019 Notes: Nutrition Nursing Diagnoses: Potential for alteratiion in Nutrition/Potential for imbalanced nutrition Goals: Patient/caregiver agrees to and verbalizes understanding of need to use nutritional supplements and/or vitamins as prescribed Date Initiated: 03/03/2019 Target Resolution Date: 03/16/2019 Goal Status: Active Interventions: Provide education on nutrition Notes: Orientation to the Wound Care Program Nursing Diagnoses: Knowledge deficit related to the wound healing center program Goals: Patient/caregiver will verbalize understanding of the Wound Healing Center Program Date Initiated: 03/03/2019 Target Resolution Date: 03/16/2019 Goal Status: Active Interventions: Provide education on orientation to the wound center Notes: Pressure Nursing Diagnoses: Knowledge deficit related to management of pressures ulcers Goals: Patient/caregiver will verbalize understanding of pressure ulcer management Date Initiated: 03/03/2019 Target Resolution Date: 03/16/2019 Goal Status:  Active Interventions: Assess: immobility, friction, shearing, incontinence upon admission and as needed Provide education on pressure ulcers Treatment Activities: Patient referred for home evaluation of offloading devices/mattresses : 03/03/2019 [name] LyeHALL, Kara A. ([id number]) Patient referred for pressure reduction/relief devices : 03/03/2019 Pressure reduction/relief device ordered : 03/03/2019 Notes: Wound/Skin Impairment Nursing Diagnoses: Impaired tissue integrity Goals: Ulcer/skin breakdown will have a volume reduction of 30% by week 4 Date Initiated: 03/03/2019 Target Resolution Date: 03/03/2019 Goal Status: Active Interventions: Assess ulceration(s) every visit Treatment Activities: Skin care regimen initiated : 03/03/2019 Notes: Electronic Signature(s) Signed: 04/21/2019 12:37:56 PM By: Rodell PernaScott, Dajea Entered By: Rodell PernaScott, Dajea on 04/21/2019 09:50:23 [name] [name], [name] ([id number]) -------------------------------------------------------------------------------- Pain Assessment Details Patient Name: [name] LyeHALL, Clotilde A. Date of Service: 04/21/2019 9:15 AM Medical Record Number: [id number] Patient Account Number: [id number] Date of Birth/Sex: 08/14/1928 (85 y.o. F) Treating RN: Huel CoventryWoody, Kim Primary Care Rella Egelston: Dewaine OatsATE, DENNY Other Clinician: Referring Madissen Wyse: Dewaine OatsATE, DENNY Treating Oswald Pott/Extender: Linwood DibblesSTONE III, HOYT Weeks in Treatment: 7 Active Problems Location of Pain Severity and Description of Pain Patient Has Paino  Patient Unable to Respond Site Locations Pain Management and Medication Current Pain Management: Goals for Pain Management dementia Electronic Signature(s) Signed: 04/21/2019 5:32:08 PM By: Elliot Gurney, BSN, RN, CWS, Kim RN, BSN Entered By: Elliot Gurney, BSN, RN, CWS, Kim on 04/21/2019 09:27:43 [name] [name] ([id number]) -------------------------------------------------------------------------------- Patient/Caregiver Education Details Patient Name: [name] Lye A. Date of Service:  04/21/2019 9:15 AM Medical Record Number: [id number] Patient Account Number: [id number] Date of Birth/Gender: 1928/08/08 (85 y.o. F) Treating RN: Rodell Perna Primary Care Physician: Dewaine Oats Other Clinician: Referring Physician: Dewaine Oats Treating Physician/Extender: Skeet Simmer in Treatment: 7 Education Assessment Education Provided To: Patient Education Topics Provided Wound/Skin Impairment: Handouts: Caring for Your Ulcer Methods: Demonstration, Explain/Verbal Responses: State content correctly Electronic Signature(s) Signed: 04/21/2019 12:37:56 PM By: Rodell Perna Entered By: Rodell Perna on 04/21/2019 09:54:24 [name] [name], [name] ([id number]) -------------------------------------------------------------------------------- Wound Assessment Details Patient Name: [name] Lye A. Date of Service: 04/21/2019 9:15 AM Medical Record Number: [id number] Patient Account Number: [id number] Date of Birth/Sex: Sep 30, 1928 (85 y.o. F) Treating RN: Huel Coventry Primary Care Nyna Chilton: Dewaine Oats Other Clinician: Referring Damir Leung: Dewaine Oats Treating Kendal Ghazarian/Extender: Linwood Dibbles, HOYT Weeks in Treatment: 7 Wound Status Wound Number: 3 Primary Pressure Ulcer Etiology: Wound Location: Left Ischium Wound Status: Open Wounding Event: Pressure Injury Comorbid Glaucoma, Hypertension, Osteoarthritis, Date Acquired: 07/18/2018 History: Dementia Weeks Of Treatment: 7 Clustered Wound: No Photos Wound Measurements Length: (cm) 0.5 % Reduction i Width: (cm) 1 % Reduction i Depth: (cm) 0.3 Epithelializa Area: (cm) 0.393 Tunneling: Volume: (cm) 0.118 Position Maximum Di n Area: 96.6% n Volume: 99.5% tion: None Yes (o'clock): 8 stance: (cm) 2.6 Undermining: No Wound Description Classification: Category/Stage IV Foul Odor Af Wound Margin: Thickened Slough/Fibri Exudate Amount: Large Exudate Type: Purulent Exudate Color: yellow, brown, green ter Cleansing: No no Yes Wound  Bed Granulation Amount: Medium (34-66%) Exposed Structure Granulation Quality: Pink Fascia Exposed: No Necrotic Amount: Medium (34-66%) Fat Layer (Subcutaneous Tissue) Exposed: Yes Necrotic Quality: Adherent Slough Tendon Exposed: No Muscle Exposed: Yes Necrosis of Muscle: No Joint Exposed: No [name] [name], [name] ([id number]) Bone Exposed: No Treatment Notes Wound #3 (Left Ischium) Notes dakins soaked gauze, ABD, tape Electronic Signature(s) Signed: 04/21/2019 5:32:08 PM By: Elliot Gurney, BSN, RN, CWS, Kim RN, BSN Entered By: Elliot Gurney, BSN, RN, CWS, Kim on 04/21/2019 09:38:04 [name] Lye A. ([id number]) -------------------------------------------------------------------------------- Wound Assessment Details Patient Name: [name] Lye A. Date of Service: 04/21/2019 9:15 AM Medical Record Number: [id number] Patient Account Number: [id number] Date of Birth/Sex: 1929-03-29 (85 y.o. F) Treating RN: Huel Coventry Primary Care Timesha Cervantez: Dewaine Oats Other Clinician: Referring Norbert Malkin: Dewaine Oats Treating Rigby Leonhardt/Extender: Linwood Dibbles, HOYT Weeks in Treatment: 7 Wound Status Wound Number: 4 Primary Pressure Ulcer Etiology: Wound Location: Sacrum - Midline Wound Status: Open Wounding Event: Pressure Injury Comorbid Glaucoma, Hypertension, Osteoarthritis, Date Acquired: 07/18/2018 History: Dementia Weeks Of Treatment: 7 Clustered Wound: Yes Photos Wound Measurements Length: (cm) 2 % Reducti Width: (cm) 1 % Reducti Depth: (cm) 2 Epithelia Clustered Quantity: 3 Tunneling Area: (cm) 1.571 Undermin Volume: (cm) 3.142 Start Ending Maximu on in Area: 90.9% on in Volume: -81.8% lization: Medium (34-66%) : No ing: Yes ing Position (o'clock): 9 Position (o'clock): 2 m Distance: (cm) 2 Wound Description Classification: Category/Stage III Foul Odo Wound Margin: Indistinct, nonvisible Slough/F Exudate Amount: Medium Exudate Type: Purulent Exudate Color: yellow, brown, green r After Cleansing:  No ibrino Yes Wound Bed Granulation Amount: None Present (0%) Exposed Structure Necrotic Amount: Large (67-100%) Fascia Exposed: No Necrotic Quality: Adherent Slough Fat Layer (Subcutaneous Tissue) Exposed: Yes  Tendon Exposed: No Muscle Exposed: No Joint Exposed: No Wierenga, Latria A. ([id number]) Bone Exposed: No Treatment Notes Wound #4 (Midline Sacrum) Notes dakins soaked gauze, ABD, tape Electronic Signature(s) Signed: 04/21/2019 5:32:08 PM By: Elliot Gurney, BSN, RN, CWS, Kim RN, BSN Entered By: Elliot Gurney, BSN, RN, CWS, Kim on 04/21/2019 09:38:45 [name], [name] ([id number]) -------------------------------------------------------------------------------- Vitals Details Patient Name: [name] Lye A. Date of Service: 04/21/2019 9:15 AM Medical Record Number: [id number] Patient Account Number: [id number] Date of Birth/Sex: March 05, 1929 (85 y.o. F) Treating RN: Huel Coventry Primary Care Mckenize Mezera: Dewaine Oats Other Clinician: Referring Ajai Terhaar: Dewaine Oats Treating Miaisabella Bacorn/Extender: Linwood Dibbles, HOYT Weeks in Treatment: 7 Vital Signs Time Taken: 09:27 Temperature (F): 98.9 Height (in): 67 Pulse (bpm): 99 Weight (lbs): 140 Respiratory Rate (breaths/min): 16 Body Mass Index (BMI): 21.9 Blood Pressure (mmHg): 127/73 Reference Range: 80 - 120 mg / dl Electronic Signature(s) Signed: 04/21/2019 5:32:08 PM By: Elliot Gurney, BSN, RN, CWS, Kim RN, BSN Entered By: Elliot Gurney, BSN, RN, CWS, Kim on 04/21/2019 [phone]

## 2019-04-21 NOTE — Progress Notes (Addendum)
ERNESTA, TRABERT ([id number]) Visit Report for 04/21/2019 Chief Complaint Document Details Patient Name: [name] [name], [name] Date of Service: 04/21/2019 9:15 AM Medical Record Number: [id number] Patient Account Number: [id number] Date of Birth/Sex: 01/11/1929 (85 y.o. F) Treating RN: Rodell Perna Primary Care Provider: Dewaine Oats Other Clinician: Referring Provider: Dewaine Oats Treating Provider/Extender: Linwood Dibbles, HOYT Weeks in Treatment: 7 Information Obtained from: Patient Chief Complaint Multiple pressure ulcers Electronic Signature(s) Signed: 04/21/2019 9:22:32 AM By: Lenda Kelp PA-C Entered By: Lenda Kelp on 04/21/2019 09:22:32 [name] ([id number]) -------------------------------------------------------------------------------- HPI Details Patient Name: [name] Date of Service: 04/21/2019 9:15 AM Medical Record Number: [id number] Patient Account Number: [id number] Date of Birth/Sex: 1928/07/28 (85 y.o. F) Treating RN: Rodell Perna Primary Care Provider: Dewaine Oats Other Clinician: Referring Provider: Dewaine Oats Treating Provider/Extender: Linwood Dibbles, HOYT Weeks in Treatment: 7 History of Present Illness HPI Description: 03/03/2019 on evaluation today patient presents for initial evaluation here in the clinic today concerning issues that she has been having with wounds which are pressure in nature at multiple locations currently. This is on her left heel, right ankle, left hip/ischial location, and all her to varying degrees of severity and depth. The worst is on the left ischial location. At this time the patient has been tolerating dressing changes although I am not exactly sure the dressings that have been utilized prior to coming in today. She does have some necrotic tissue noted at several locations this can require at least some degree of cleaning prior to application of dressings going forward. The patient does have dementia and unfortunately in recent months  has become increasingly immobile requiring more significant treatment she was just recently moved roughly 2 weeks ago from assisted living to a skilled nursing facility at [facility]. Based on what I am seeing currently these pressure injuries occurred prior to that 2-week time I do not see any new or obvious pressure injury at this point. I discussed with the patient as well as her family member that this does appear to be doing better in my opinion than likely where things started. They are unsure as to whether or not she has an air mattress at the facility I do think that would be something that would be appropriate and good for her to have but at the moment I do not know for sure whether that is already in place if not that definitely is can be 1 of the recommendations. Also think a Roho cushion for her wheelchair would be good as well as Prevalon offloading boots. The patient currently does not have any severe pain although when I was cleaning some of the areas she did note there was some discomfort at several of the locations. She does have a history of hypertension as well. 10/29; this is a patient from [facility] nursing home. She is nonambulatory. She is here for follow-up on 4 different wounds 1 over the left lateral malleolus, the right medial calcaneus, the left posterior hip and I believe a new area over the sacrum. The patient is nonambulatory but apparently eats well. 03/24/2019 on evaluation today patient actually appears to be doing better with regard to her wounds in general based on what I am seeing. Fortunately there is no signs of active infection at this time. No fever chills noted. Overall been very pleased with the progress that seems to be occurring since I last saw her. There is a little bit of debridement That will need to be performed  today. 04/07/2019 on evaluation today patient actually appears to be doing well in some regards based on what I am seeing  today. Fortunately there is no signs of active infection at this time which is good news. No fever chills noted. With that being said the wounds on her foot and ankle region in general have healed at this point. I do not see anything open in that region. With regard to the other 2 wounds in the left ischium and midline sacral region I believe that we may need to make a dressing change at this point to do something a little bit more conducive to cleaning up the wound bed. The patient is in agreement with that plan today. 04/21/2019 on evaluation today patient actually appears to be doing well with regard to her wounds all things considered. Both appear to be much cleaner than what they were previous. Fortunately there is no signs of active infection at this time. Both I think are doing well with the current wound care measures which is the Dakin moistened gauze dressings. Electronic Signature(s) Signed: 04/21/2019 9:55:59 AM By: Lenda Kelp PA-C Entered By: Lenda Kelp on 04/21/2019 09:55:58 [name], [name] ([id number]) -------------------------------------------------------------------------------- Physical Exam Details Patient Name: [name] Date of Service: 04/21/2019 9:15 AM Medical Record Number: [id number] Patient Account Number: [id number] Date of Birth/Sex: [birth year] (85 y.o. F) Treating RN: Rodell Perna Primary Care Provider: Dewaine Oats Other Clinician: Referring Provider: TATE, Katherina Right Treating Provider/Extender: STONE III, HOYT Weeks in Treatment: 7 Constitutional Well-nourished and well-hydrated in no acute distress. Respiratory normal breathing without difficulty. clear to auscultation bilaterally. Cardiovascular regular rate and rhythm with normal S1, S2. Psychiatric this patient is able to make decisions and demonstrates good insight into disease process. Alert and Oriented x 3. pleasant and cooperative. Notes Patient's wound bed upon inspection today showed  signs of good granulation at this time at both wound locations and seems to be healing nicely. Obviously this is improved compared to last evaluation that I had with the patient. I am pleased in that regard as things seem to be much cleaner and healthier appearing. Electronic Signature(s) Signed: 04/21/2019 9:56:29 AM By: Lenda Kelp PA-C Entered By: Lenda Kelp on 04/21/2019 09:56:29 [name] ([id number]) -------------------------------------------------------------------------------- Physician Orders Details Patient Name: [name] Date of Service: 04/21/2019 9:15 AM Medical Record Number: [id number] Patient Account Number: [id number] Date of Birth/Sex: November 30, 1928 (85 y.o. F) Treating RN: Rodell Perna Primary Care Provider: Dewaine Oats Other Clinician: Referring Provider: Dewaine Oats Treating Provider/Extender: Linwood Dibbles, HOYT Weeks in Treatment: 7 Verbal / Phone Orders: No Diagnosis Coding ICD-10 Coding Code Description [phone] Pressure ulcer of left heel, stage 3 L89.513 Pressure ulcer of right ankle, stage 3 L89.224 Pressure ulcer of left hip, stage 4 L89.153 Pressure ulcer of sacral region, stage 3 I10 Essential (primary) hypertension F01.50 Vascular dementia without behavioral disturbance Wound Cleansing Wound #3 Left Ischium o Clean wound with Normal Saline. o Cleanse wound with mild soap and water Wound #4 Midline Sacrum o Clean wound with Normal Saline. o Cleanse wound with mild soap and water Anesthetic (add to Medication List) Wound #3 Left Ischium o Topical Lidocaine 4% cream applied to wound bed prior to debridement (In Clinic Only). Wound #4 Midline Sacrum o Topical Lidocaine 4% cream applied to wound bed prior to debridement (In Clinic Only). Primary Wound Dressing Wound #3 Left Ischium o Other: - Dakins moistened gauze packed Wound #4 Midline Sacrum o Other: - Dakins moistened gauze packed  Secondary Dressing Wound #3 Left  Ischium o ABD pad Wound #4 Midline Sacrum o ABD pad Dressing Change Frequency Wound #3 Left Ischium Belcastro, Dariah A. ([id number]) o Change dressing every day. Wound #4 Midline Sacrum o Change dressing every day. Follow-up Appointments Wound #3 Left Ischium o Return Appointment in 2 weeks. Wound #4 Midline Sacrum o Return Appointment in 2 weeks. Off-Loading Wound #3 Left Ischium o Heel suspension boot to: o Mattress - Air mattress o Turn and reposition every 2 hours Wound #4 Midline Sacrum o Heel suspension boot to: o Mattress [employer] o Turn and reposition every 2 hours Electronic Signature(s) Signed: 04/21/2019 12:37:56 PM By: Rodell Perna Signed: 04/21/2019 5:16:54 PM By: Lenda Kelp PA-C Entered By: Rodell Perna on 04/21/2019 09:53:11 Alcantar, [name] ([id number]) -------------------------------------------------------------------------------- Problem List Details Patient Name: [name] Date of Service: 04/21/2019 9:15 AM Medical Record Number: [id number] Patient Account Number: [id number] Date of Birth/Sex: 1928-06-24 (85 y.o. F) Treating RN: Rodell Perna Primary Care Provider: Dewaine Oats Other Clinician: Referring Provider: Dewaine Oats Treating Provider/Extender: Linwood Dibbles, HOYT Weeks in Treatment: 7 Active Problems ICD-10 Evaluated Encounter Code Description Active Date Today Diagnosis L89.623 Pressure ulcer of left heel, stage 3 03/03/2019 No Yes L89.513 Pressure ulcer of right ankle, stage 3 03/03/2019 No Yes L89.224 Pressure ulcer of left hip, stage 4 03/03/2019 No Yes L89.153 Pressure ulcer of sacral region, stage 3 03/24/2019 No Yes I10 Essential (primary) hypertension 03/03/2019 No Yes F01.50 Vascular dementia without behavioral disturbance 03/03/2019 No Yes Inactive Problems Resolved Problems Electronic Signature(s) Signed: 04/21/2019 9:22:26 AM By: Lenda Kelp PA-C Entered By: Lenda Kelp on 04/21/2019  09:22:25 [name] ([id number]) -------------------------------------------------------------------------------- Progress Note Details Patient Name: [name] Date of Service: 04/21/2019 9:15 AM Medical Record Number: [id number] Patient Account Number: [id number] Date of Birth/Sex: 02/12/1929 (85 y.o. F) Treating RN: Rodell Perna Primary Care Provider: Dewaine Oats Other Clinician: Referring Provider: Dewaine Oats Treating Provider/Extender: Linwood Dibbles, HOYT Weeks in Treatment: 7 Subjective Chief Complaint Information obtained from Patient Multiple pressure ulcers History of Present Illness (HPI) 03/03/2019 on evaluation today patient presents for initial evaluation here in the clinic today concerning issues that she has been having with wounds which are pressure in nature at multiple locations currently. This is on her left heel, right ankle, left hip/ischial location, and all her to varying degrees of severity and depth. The worst is on the left ischial location. At this time the patient has been tolerating dressing changes although I am not exactly sure the dressings that have been utilized prior to coming in today. She does have some necrotic tissue noted at several locations this can require at least some degree of cleaning prior to application of dressings going forward. The patient does have dementia and unfortunately in recent months has become increasingly immobile requiring more significant treatment she was just recently moved roughly 2 weeks ago from assisted living to a skilled nursing facility at [facility]. Based on what I am seeing currently these pressure injuries occurred prior to that 2-week time I do not see any new or obvious pressure injury at this point. I discussed with the patient as well as her family member that this does appear to be doing better in my opinion than likely where things started. They are unsure as to whether or not she has an air mattress  at the facility I do think that would be something that would be appropriate and good for her to have  but at the moment I do not know for sure whether that is already in place if not that definitely is can be 1 of the recommendations. Also think a Roho cushion for her wheelchair would be good as well as Prevalon offloading boots. The patient currently does not have any severe pain although when I was cleaning some of the areas she did note there was some discomfort at several of the locations. She does have a history of hypertension as well. 10/29; this is a patient from [facility]. She is nonambulatory. She is here for follow-up on 4 different wounds 1 over the left lateral malleolus, the right medial calcaneus, the left posterior hip and I believe a new area over the sacrum. The patient is nonambulatory but apparently eats well. 03/24/2019 on evaluation today patient actually appears to be doing better with regard to her wounds in general based on what I am seeing. Fortunately there is no signs of active infection at this time. No fever chills noted. Overall been very pleased with the progress that seems to be occurring since I last saw her. There is a little bit of debridement That will need to be performed today. 04/07/2019 on evaluation today patient actually appears to be doing well in some regards based on what I am seeing today. Fortunately there is no signs of active infection at this time which is good news. No fever chills noted. With that being said the wounds on her foot and ankle region in general have healed at this point. I do not see anything open in that region. With regard to the other 2 wounds in the left ischium and midline sacral region I believe that we may need to make a dressing change at this point to do something a little bit more conducive to cleaning up the wound bed. The patient is in agreement with that plan today. 04/21/2019 on evaluation today  patient actually appears to be doing well with regard to her wounds all things considered. Both appear to be much cleaner than what they were previous. Fortunately there is no signs of active infection at this time. Both I think are doing well with the current wound care measures which is the Dakin moistened gauze dressings. Patient History Unable to Obtain Patient History due to Dementia. Information obtained from Patient. Christene LyeHALL, Mahek A. ([id number]) Social History Never smoker. Medical History Eyes Patient has history of Glaucoma Denies history of Cataracts, Optic Neuritis Ear/Nose/Mouth/Throat Denies history of Chronic sinus problems/congestion, Middle ear problems Hematologic/Lymphatic Denies history of Anemia, Hemophilia, Human Immunodeficiency Virus, Lymphedema, Sickle Cell Disease Respiratory Denies history of Aspiration, Asthma, Chronic Obstructive Pulmonary Disease (COPD), Pneumothorax, Sleep Apnea, Tuberculosis Cardiovascular Patient has history of Hypertension Denies history of Angina, Arrhythmia, Congestive Heart Failure, Coronary Artery Disease, Deep Vein Thrombosis, Hypotension, Myocardial Infarction, Peripheral Arterial Disease, Peripheral Venous Disease, Phlebitis, Vasculitis Gastrointestinal Denies history of Cirrhosis , Colitis, Crohn s, Hepatitis A, Hepatitis B, Hepatitis C Endocrine Denies history of Type I Diabetes, Type II Diabetes Genitourinary Denies history of End Stage Renal Disease Immunological Denies history of Lupus Erythematosus, Raynaud s, Scleroderma Integumentary (Skin) Denies history of History of Burn, History of pressure wounds Musculoskeletal Patient has history of Osteoarthritis Denies history of Gout, Rheumatoid Arthritis, Osteomyelitis Neurologic Patient has history of Dementia Denies history of Neuropathy, Quadriplegia, Paraplegia, Seizure Disorder Oncologic Denies history of Received Chemotherapy, Received  Radiation Psychiatric Denies history of Anorexia/bulimia, Confinement Anxiety Review of Systems (ROS) Constitutional Symptoms (General Health) Denies complaints or  symptoms of Fatigue, Fever, Chills, Marked Weight Change. Respiratory Denies complaints or symptoms of Chronic or frequent coughs, Shortness of Breath. Cardiovascular Denies complaints or symptoms of Chest pain, LE edema. Psychiatric Denies complaints or symptoms of Anxiety, Claustrophobia. Objective Gettinger, Arleigh A. ([id number]) Constitutional Well-nourished and well-hydrated in no acute distress. Vitals Time Taken: 9:27 AM, Height: 67 in, Weight: 140 lbs, BMI: 21.9, Temperature: 98.9 F, Pulse: 99 bpm, Respiratory Rate: 16 breaths/min, Blood Pressure: 127/73 mmHg. Respiratory normal breathing without difficulty. clear to auscultation bilaterally. Cardiovascular regular rate and rhythm with normal S1, S2. Psychiatric this patient is able to make decisions and demonstrates good insight into disease process. Alert and Oriented x 3. pleasant and cooperative. General Notes: Patient's wound bed upon inspection today showed signs of good granulation at this time at both wound locations and seems to be healing nicely. Obviously this is improved compared to last evaluation that I had with the patient. I am pleased in that regard as things seem to be much cleaner and healthier appearing. Integumentary (Hair, Skin) Wound #3 status is Open. Original cause of wound was Pressure Injury. The wound is located on the Left Ischium. The wound measures 0.5cm length x 1cm width x 0.3cm depth; 0.393cm^2 area and 0.118cm^3 volume. There is muscle and Fat Layer (Subcutaneous Tissue) Exposed exposed. There is no undermining noted, however, there is tunneling at 8:00 with a maximum distance of 2.6cm. There is a large amount of purulent drainage noted. The wound margin is thickened. There is medium (34-66%) pink granulation within the wound bed.  There is a medium (34-66%) amount of necrotic tissue within the wound bed including Adherent Slough. Wound #4 status is Open. Original cause of wound was Pressure Injury. The wound is located on the Midline Sacrum. The wound measures 2cm length x 1cm width x 2cm depth; 1.571cm^2 area and 3.142cm^3 volume. There is Fat Layer (Subcutaneous Tissue) Exposed exposed. There is no tunneling noted, however, there is undermining starting at 9:00 and ending at 2:00 with a maximum distance of 2cm. There is a medium amount of purulent drainage noted. The wound margin is indistinct and nonvisible. There is no granulation within the wound bed. There is a large (67-100%) amount of necrotic tissue within the wound bed including Adherent Slough. Assessment Active Problems ICD-10 Pressure ulcer of left heel, stage 3 Pressure ulcer of right ankle, stage 3 Pressure ulcer of left hip, stage 4 Pressure ulcer of sacral region, stage 3 Essential (primary) hypertension Vascular dementia without behavioral disturbance Heinecke, Sharmain A. ([id number]) Plan Wound Cleansing: Wound #3 Left Ischium: Clean wound with Normal Saline. Cleanse wound with mild soap and water Wound #4 Midline Sacrum: Clean wound with Normal Saline. Cleanse wound with mild soap and water Anesthetic (add to Medication List): Wound #3 Left Ischium: Topical Lidocaine 4% cream applied to wound bed prior to debridement (In Clinic Only). Wound #4 Midline Sacrum: Topical Lidocaine 4% cream applied to wound bed prior to debridement (In Clinic Only). Primary Wound Dressing: Wound #3 Left Ischium: Other: - Dakins moistened gauze packed Wound #4 Midline Sacrum: Other: - Dakins moistened gauze packed Secondary Dressing: Wound #3 Left Ischium: ABD pad Wound #4 Midline Sacrum: ABD pad Dressing Change Frequency: Wound #3 Left Ischium: Change dressing every day. Wound #4 Midline Sacrum: Change dressing every day. Follow-up Appointments: Wound #3  Left Ischium: Return Appointment in 2 weeks. Wound #4 Midline Sacrum: Return Appointment in 2 weeks. Off-Loading: Wound #3 Left Ischium: Heel suspension boot to: Mattress Education officer, community  and reposition every 2 hours Wound #4 Midline Sacrum: Heel suspension boot to: Mattress - Air mattress Turn and reposition every 2 hours 1. I am and continue with the Dakin's moistened gauze dressings for the time being as the patient seems to be doing well. 2. I would also recommend that continued offloading be practiced in order to avoid any additional pressure injuries to the areas or even other areas for that matter. 3. Patient specifically needs to be offloaded every 2 hours repositioned and should have an air mattress as well. We will see patient back for reevaluation in 2 weeks here in the clinic. If anything worsens or changes patient will contact our office for additional recommendations. Electronic Signature(s) LEENA, TIEDE ([id number]) Signed: 04/21/2019 9:56:59 AM By: Lenda Kelp PA-C Entered By: Lenda Kelp on 04/21/2019 09:56:58 [name] ([id number]) -------------------------------------------------------------------------------- ROS/PFSH Details Patient Name: [name] Date of Service: 04/21/2019 9:15 AM Medical Record Number: [id number] Patient Account Number: [id number] Date of Birth/Sex: Jul 03, 1928 (85 y.o. F) Treating RN: Rodell Perna Primary Care Provider: Dewaine Oats Other Clinician: Referring Provider: Dewaine Oats Treating Provider/Extender: STONE III, HOYT Weeks in Treatment: 7 Unable to Obtain Patient History due to oo Dementia Information Obtained From Patient Constitutional Symptoms (General Health) Complaints and Symptoms: Negative for: Fatigue; Fever; Chills; Marked Weight Change Respiratory Complaints and Symptoms: Negative for: Chronic or frequent coughs; Shortness of Breath Medical History: Negative for: Aspiration; Asthma; Chronic  Obstructive Pulmonary Disease (COPD); Pneumothorax; Sleep Apnea; Tuberculosis Cardiovascular Complaints and Symptoms: Negative for: Chest pain; LE edema Medical History: Positive for: Hypertension Negative for: Angina; Arrhythmia; Congestive Heart Failure; Coronary Artery Disease; Deep Vein Thrombosis; Hypotension; Myocardial Infarction; Peripheral Arterial Disease; Peripheral Venous Disease; Phlebitis; Vasculitis Psychiatric Complaints and Symptoms: Negative for: Anxiety; Claustrophobia Medical History: Negative for: Anorexia/bulimia; Confinement Anxiety Eyes Medical History: Positive for: Glaucoma Negative for: Cataracts; Optic Neuritis Ear/Nose/Mouth/Throat Medical History: Negative for: Chronic sinus problems/congestion; Middle ear problems Hematologic/Lymphatic PANZY, BUBECK A. ([id number]) Medical History: Negative for: Anemia; Hemophilia; Human Immunodeficiency Virus; Lymphedema; Sickle Cell Disease Gastrointestinal Medical History: Negative for: Cirrhosis ; Colitis; Crohnos; Hepatitis A; Hepatitis B; Hepatitis C Endocrine Medical History: Negative for: Type I Diabetes; Type II Diabetes Genitourinary Medical History: Negative for: End Stage Renal Disease Immunological Medical History: Negative for: Lupus Erythematosus; Raynaudos; Scleroderma Integumentary (Skin) Medical History: Negative for: History of Burn; History of pressure wounds Musculoskeletal Medical History: Positive for: Osteoarthritis Negative for: Gout; Rheumatoid Arthritis; Osteomyelitis Neurologic Medical History: Positive for: Dementia Negative for: Neuropathy; Quadriplegia; Paraplegia; Seizure Disorder Oncologic Medical History: Negative for: Received Chemotherapy; Received Radiation HBO Extended History Items Eyes: Glaucoma Immunizations Pneumococcal Vaccine: Received Pneumococcal Vaccination: No Implantable Devices None Family and Social History Never smoker ETHAL, GOTAY  ([id number]) Physician Affirmation I have reviewed and agree with the above information. Electronic Signature(s) Signed: 04/21/2019 12:37:56 PM By: Rodell Perna Signed: 04/21/2019 5:16:54 PM By: Lenda Kelp PA-C Entered By: Lenda Kelp on 04/21/2019 09:56:13 [place], [name] ([id number]) -------------------------------------------------------------------------------- SuperBill Details Patient Name: [name] Date of Service: 04/21/2019 Medical Record Number: [id number] Patient Account Number: [id number] Date of Birth/Sex: 1928-07-17 (85 y.o. F) Treating RN: Rodell Perna Primary Care Provider: Dewaine Oats Other Clinician: Referring Provider: Dewaine Oats Treating Provider/Extender: Linwood Dibbles, HOYT Weeks in Treatment: 7 Diagnosis Coding ICD-10 Codes Code Description [phone] Pressure ulcer of left heel, stage 3 L89.513 Pressure ulcer of right ankle, stage 3 L89.224 Pressure ulcer of left hip, stage 4 L89.153 Pressure ulcer of sacral region, stage 3 I10  Essential (primary) hypertension F01.50 Vascular dementia without behavioral disturbance Facility Procedures CPT4 Code: [id number] Description: 99214 - WOUND CARE VISIT-LEV 4 EST PT Modifier: Quantity: 1 Physician Procedures CPT4 Code: [id number] Description: 34196 - WC PHYS LEVEL 4 - EST PT ICD-10 Diagnosis Description L89.623 Pressure ulcer of left heel, stage 3 L89.513 Pressure ulcer of right ankle, stage 3 L89.224 Pressure ulcer of left hip, stage 4 L89.153 Pressure ulcer of sacral region,  stage 3 Modifier: Quantity: 1 Electronic Signature(s) Signed: 04/21/2019 9:57:15 AM By: Worthy Keeler PA-C Entered By: Worthy Keeler on 04/21/2019 09:57:15

## 2019-05-05 ENCOUNTER — Other Ambulatory Visit: Payer: Self-pay

## 2019-05-05 ENCOUNTER — Encounter: Payer: Medicare Other | Admitting: Physician Assistant

## 2019-05-05 DIAGNOSIS — L89623 Pressure ulcer of left heel, stage 3: Secondary | ICD-10-CM | POA: Diagnosis not present

## 2019-05-05 NOTE — Progress Notes (Addendum)
[name], [name] ([id number]) Visit Report for 05/05/2019 Chief Complaint Document Details Patient Name: [name] [name], [name] Date of Service: 05/05/2019 9:15 AM Medical Record Number: [id number] Patient Account Number: [id number] Date of Birth/Sex: Sep 21, 1928 (85 y.o. F) Treating RN: Rodell Perna Primary Care Provider: Dewaine Oats Other Clinician: Referring Provider: Dewaine Oats Treating Provider/Extender: Linwood Dibbles, Marisol Giambra Weeks in Treatment: 9 Information Obtained from: Patient Chief Complaint Multiple pressure ulcers Electronic Signature(s) Signed: 05/05/2019 9:50:30 AM By: Lenda Kelp PA-C Entered By: Lenda Kelp on 05/05/2019 09:50:30 [name], [name] ([id number]) -------------------------------------------------------------------------------- HPI Details Patient Name: [name] Lye A. Date of Service: 05/05/2019 9:15 AM Medical Record Number: [id number] Patient Account Number: [id number] Date of Birth/Sex: 1929-05-15 (85 y.o. F) Treating RN: Rodell Perna Primary Care Provider: Dewaine Oats Other Clinician: Referring Provider: Dewaine Oats Treating Provider/Extender: Linwood Dibbles, Milyn Stapleton Weeks in Treatment: 9 History of Present Illness HPI Description: 03/03/2019 on evaluation today patient presents for initial evaluation here in the clinic today concerning issues that she has been having with wounds which are pressure in nature at multiple locations currently. This is on her left heel, right ankle, left hip/ischial location, and all her to varying degrees of severity and depth. The worst is on the left ischial location. At this time the patient has been tolerating dressing changes although I am not exactly sure the dressings that have been utilized prior to coming in today. She does have some necrotic tissue noted at several locations this can require at least some degree of cleaning prior to application of dressings going forward. The patient does have dementia and unfortunately in recent  months has become increasingly immobile requiring more significant treatment she was just recently moved roughly 2 weeks ago from assisted living to a skilled nursing facility at [facility]. Based on what I am seeing currently these pressure injuries occurred prior to that 2-week time I do not see any new or obvious pressure injury at this point. I discussed with the patient as well as her family member that this does appear to be doing better in my opinion than likely where things started. They are unsure as to whether or not she has an air mattress at the facility I do think that would be something that would be appropriate and good for her to have but at the moment I do not know for sure whether that is already in place if not that definitely is can be 1 of the recommendations. Also think a Roho cushion for her wheelchair would be good as well as Prevalon offloading boots. The patient currently does not have any severe pain although when I was cleaning some of the areas she did note there was some discomfort at several of the locations. She does have a history of hypertension as well. 10/29; this is a patient from [facility] nursing home. She is nonambulatory. She is here for follow-up on 4 different wounds 1 over the left lateral malleolus, the right medial calcaneus, the left posterior hip and I believe a new area over the sacrum. The patient is nonambulatory but apparently eats well. 03/24/2019 on evaluation today patient actually appears to be doing better with regard to her wounds in general based on what I am seeing. Fortunately there is no signs of active infection at this time. No fever chills noted. Overall been very pleased with the progress that seems to be occurring since I last saw her. There is a little bit of debridement That will need to be performed  today. 04/07/2019 on evaluation today patient actually appears to be doing well in some regards based on what I am seeing  today. Fortunately there is no signs of active infection at this time which is good news. No fever chills noted. With that being said the wounds on her foot and ankle region in general have healed at this point. I do not see anything open in that region. With regard to the other 2 wounds in the left ischium and midline sacral region I believe that we may need to make a dressing change at this point to do something a little bit more conducive to cleaning up the wound bed. The patient is in agreement with that plan today. 04/21/2019 on evaluation today patient actually appears to be doing well with regard to her wounds all things considered. Both appear to be much cleaner than what they were previous. Fortunately there is no signs of active infection at this time. Both I think are doing well with the current wound care measures which is the Dakin moistened gauze dressings. 05/05/2019 on evaluation today patient seems to be doing much better in regard to her ischial ulcer unfortunately she still seems to be getting pressure to the sacral region. I think that this is evidenced by the fact that she does have some surrounding deep tissue injury and the fact that the wound is actually measuring larger not better. Fortunately there is no evidence of active infection at this time I do believe the Dakin's is still the best option treatment wise but again I do think the patient needs more appropriate offloading on the sacral region. Electronic Signature(s) Signed: 05/05/2019 10:03:58 AM By: Lenda Kelp PA-C Entered By: Lenda Kelp on 05/05/2019 10:03:57 [name], [name] ([name], [name] ([id number]) -------------------------------------------------------------------------------- Physical Exam Details Patient Name: [name] Lye A. Date of Service: 05/05/2019 9:15 AM Medical Record Number: [id number] Patient Account Number: [id number] Date of Birth/Sex: June 18, 1928 (85 y.o. F) Treating RN:  Rodell Perna Primary Care Provider: Dewaine Oats Other Clinician: Referring Provider: TATE, Katherina Right Treating Provider/Extender: STONE III, Jihad Brownlow Weeks in Treatment: 9 Constitutional Well-nourished and well-hydrated in no acute distress. Respiratory normal breathing without difficulty. clear to auscultation bilaterally. Cardiovascular regular rate and rhythm with normal S1, S2. Psychiatric this patient is able to make decisions and demonstrates good insight into disease process. Alert and Oriented x 3. pleasant and cooperative. Notes Based on what I am seeing at this point I am somewhat concerned about the wound in the sacral region at this time. The left ischium seems to be doing much better. However the sacrum just is not seeming to make of the progress that I would like to see and unfortunately I think this is due to pressure to the area that still occurring. I think the patient really needs to have a Roho cushion for the chair and an air mattress if she does not already have 1. I have mentioned this before in the notes but I am not sure that the notes have made it to the facility. Electronic Signature(s) Signed: 05/05/2019 5:22:32 PM By: Lenda Kelp PA-C Entered By: Lenda Kelp on 05/05/2019 17:22:32 [name] ([id number]) -------------------------------------------------------------------------------- Physician Orders Details Patient Name: [name] Lye A. Date of Service: 05/05/2019 9:15 AM Medical Record Number: [id number] Patient Account Number: [id number] Date of Birth/Sex: June 01, 1928 (85 y.o. F) Treating RN: Rodell Perna Primary Care Provider: Dewaine Oats Other Clinician: Referring Provider: Dewaine Oats Treating Provider/Extender: STONE III, Jalie Eiland Weeks in Treatment: 9  Verbal / Phone Orders: No Diagnosis Coding ICD-10 Coding Code Description [phone] Pressure ulcer of left heel, stage 3 L89.513 Pressure ulcer of right ankle, stage 3 L89.224 Pressure ulcer of left  hip, stage 4 L89.153 Pressure ulcer of sacral region, stage 3 I10 Essential (primary) hypertension F01.50 Vascular dementia without behavioral disturbance Wound Cleansing Wound #3 Left Ischium o Clean wound with Normal Saline. o Cleanse wound with mild soap and water Wound #4 Midline Sacrum o Clean wound with Normal Saline. o Cleanse wound with mild soap and water Anesthetic (add to Medication List) Wound #3 Left Ischium o Topical Lidocaine 4% cream applied to wound bed prior to debridement (In Clinic Only). Wound #4 Midline Sacrum o Topical Lidocaine 4% cream applied to wound bed prior to debridement (In Clinic Only). Primary Wound Dressing Wound #3 Left Ischium o Silver Collagen Wound #4 Midline Sacrum o Other: - Dakins moistened gauze packed Secondary Dressing Wound #3 Left Ischium o Boardered Foam Dressing Wound #4 Midline Sacrum o ABD pad - secure with tape Dressing Change Frequency Wound #3 Left Ischium Wandersee, Ilyana A. ([id number]) o Change dressing every other day. - As needed with incontinence Wound #4 Midline Sacrum o Change dressing every day. - as needed with incontinence Follow-up Appointments Wound #3 Left Ischium o Return Appointment in 3 weeks. Wound #4 Midline Sacrum o Return Appointment in 3 weeks. Off-Loading Wound #3 Left Ischium o Heel suspension boot to: o Roho cushion for wheelchair - pt needs Roho cushion for her chair o Mattress - Pt needs Air mattress o Turn and reposition every 2 hours - Pt is still getting pressure to her midline sacrum. Please reposition pt EVERY 2 HOURS Wound #4 Midline Sacrum o Heel suspension boot to: o Roho cushion for wheelchair - pt needs Roho cushion for her chair o Mattress - Pt needs Air mattress o Turn and reposition every 2 hours - Pt is still getting pressure to her midline sacrum. Please reposition pt EVERY 2 HOURS Electronic Signature(s) Signed: 05/05/2019 11:07:02  AM By: Rodell Perna Signed: 05/06/2019 10:58:36 AM By: Lenda Kelp PA-C Entered By: Rodell Perna on 05/05/2019 10:24:55 [name] Lye A. ([id number]) -------------------------------------------------------------------------------- Problem List Details Patient Name: [name] Lye A. Date of Service: 05/05/2019 9:15 AM Medical Record Number: [id number] Patient Account Number: [id number] Date of Birth/Sex: 1928-10-13 (85 y.o. F) Treating RN: Rodell Perna Primary Care Provider: Dewaine Oats Other Clinician: Referring Provider: Dewaine Oats Treating Provider/Extender: Linwood Dibbles, Lakeysha Slutsky Weeks in Treatment: 9 Active Problems ICD-10 Evaluated Encounter Code Description Active Date Today Diagnosis L89.623 Pressure ulcer of left heel, stage 3 03/03/2019 No Yes L89.513 Pressure ulcer of right ankle, stage 3 03/03/2019 No Yes L89.224 Pressure ulcer of left hip, stage 4 03/03/2019 No Yes L89.153 Pressure ulcer of sacral region, stage 3 03/24/2019 No Yes I10 Essential (primary) hypertension 03/03/2019 No Yes F01.50 Vascular dementia without behavioral disturbance 03/03/2019 No Yes Inactive Problems Resolved Problems Electronic Signature(s) Signed: 05/05/2019 9:50:25 AM By: Lenda Kelp PA-C Entered By: Lenda Kelp on 05/05/2019 09:50:24 Gracey, Tajah A. ([id number]) -------------------------------------------------------------------------------- Progress Note Details Patient Name: [name] Lye A. Date of Service: 05/05/2019 9:15 AM Medical Record Number: [id number] Patient Account Number: [id number] Date of Birth/Sex: 22-Feb-1929 (85 y.o. F) Treating RN: Rodell Perna Primary Care Provider: Dewaine Oats Other Clinician: Referring Provider: Dewaine Oats Treating Provider/Extender: Linwood Dibbles, Chrystle Murillo Weeks in Treatment: 9 Subjective Chief Complaint Information obtained from Patient Multiple pressure ulcers History of Present Illness (HPI) 03/03/2019 on evaluation today patient presents for initial  evaluation here in the clinic today concerning issues that she has been having with wounds which are pressure in nature at multiple locations currently. This is on her left heel, right ankle, left hip/ischial location, and all her to varying degrees of severity and depth. The worst is on the left ischial location. At this time the patient has been tolerating dressing changes although I am not exactly sure the dressings that have been utilized prior to coming in today. She does have some necrotic tissue noted at several locations this can require at least some degree of cleaning prior to application of dressings going forward. The patient does have dementia and unfortunately in recent months has become increasingly immobile requiring more significant treatment she was just recently moved roughly 2 weeks ago from assisted living to a skilled nursing facility at [facility]. Based on what I am seeing currently these pressure injuries occurred prior to that 2-week time I do not see any new or obvious pressure injury at this point. I discussed with the patient as well as her family member that this does appear to be doing better in my opinion than likely where things started. They are unsure as to whether or not she has an air mattress at the facility I do think that would be something that would be appropriate and good for her to have but at the moment I do not know for sure whether that is already in place if not that definitely is can be 1 of the recommendations. Also think a Roho cushion for her wheelchair would be good as well as Prevalon offloading boots. The patient currently does not have any severe pain although when I was cleaning some of the areas she did note there was some discomfort at several of the locations. She does have a history of hypertension as well. 10/29; this is a patient from [facility] nursing [facility]. She is nonambulatory. She is here for follow-up on 4 different wounds  1 over the left lateral malleolus, the right medial calcaneus, the left posterior hip and I believe a new area over the sacrum. The patient is nonambulatory but apparently eats well. 03/24/2019 on evaluation today patient actually appears to be doing better with regard to her wounds in general based on what I am seeing. Fortunately there is no signs of active infection at this time. No fever chills noted. Overall been very pleased with the progress that seems to be occurring since I last saw her. There is a little bit of debridement That will need to be performed today. 04/07/2019 on evaluation today patient actually appears to be doing well in some regards based on what I am seeing today. Fortunately there is no signs of active infection at this time which is good news. No fever chills noted. With that being said the wounds on her foot and ankle region in general have healed at this point. I do not see anything open in that region. With regard to the other 2 wounds in the left ischium and midline sacral region I believe that we may need to make a dressing change at this point to do something a little bit more conducive to cleaning up the wound bed. The patient is in agreement with that plan today. 04/21/2019 on evaluation today patient actually appears to be doing well with regard to her wounds all things considered. Both appear to be much cleaner than what they were previous. Fortunately there is no signs of active infection at this  time. Both I think are doing well with the current wound care measures which is the Dakin moistened gauze dressings. 05/05/2019 on evaluation today patient seems to be doing much better in regard to her ischial ulcer unfortunately she still seems to be getting pressure to the sacral region. I think that this is evidenced by the fact that she does have some surrounding deep tissue injury and the fact that the wound is actually measuring larger not better. Fortunately  there is no evidence of active infection at this time I do believe the Dakin's is still the best option treatment wise but again I do think the patient needs more appropriate offloading on the sacral region. EDITHA, BRIDGEFORTH A. ([id number]) Patient History Unable to Obtain Patient History due to Dementia. Information obtained from Patient. Social History Never smoker. Medical History Eyes Patient has history of Glaucoma Denies history of Cataracts, Optic Neuritis Ear/Nose/Mouth/Throat Denies history of Chronic sinus problems/congestion, Middle ear problems Hematologic/Lymphatic Denies history of Anemia, Hemophilia, Human Immunodeficiency Virus, Lymphedema, Sickle Cell Disease Respiratory Denies history of Aspiration, Asthma, Chronic Obstructive Pulmonary Disease (COPD), Pneumothorax, Sleep Apnea, Tuberculosis Cardiovascular Patient has history of Hypertension Denies history of Angina, Arrhythmia, Congestive Heart Failure, Coronary Artery Disease, Deep Vein Thrombosis, Hypotension, Myocardial Infarction, Peripheral Arterial Disease, Peripheral Venous Disease, Phlebitis, Vasculitis Gastrointestinal Denies history of Cirrhosis , Colitis, Crohn s, Hepatitis A, Hepatitis B, Hepatitis C Endocrine Denies history of Type I Diabetes, Type II Diabetes Genitourinary Denies history of End Stage Renal Disease Immunological Denies history of Lupus Erythematosus, Raynaud s, Scleroderma Integumentary (Skin) Denies history of History of Burn, History of pressure wounds Musculoskeletal Patient has history of Osteoarthritis Denies history of Gout, Rheumatoid Arthritis, Osteomyelitis Neurologic Patient has history of Dementia Denies history of Neuropathy, Quadriplegia, Paraplegia, Seizure Disorder Oncologic Denies history of Received Chemotherapy, Received Radiation Psychiatric Denies history of Anorexia/bulimia, Confinement Anxiety Review of Systems (ROS) Constitutional Symptoms (General  Health) Denies complaints or symptoms of Fatigue, Fever, Chills, Marked Weight Change. Respiratory Denies complaints or symptoms of Chronic or frequent coughs, Shortness of Breath. Cardiovascular Denies complaints or symptoms of Chest pain, LE edema. Psychiatric Denies complaints or symptoms of Anxiety, Claustrophobia. JOHARI, BENNETTS A. ([id number]) Objective Constitutional Well-nourished and well-hydrated in no acute distress. Vitals Time Taken: 9:15 AM, Height: 67 in, Weight: 140 lbs, BMI: 21.9, Temperature: 99.2 F, Pulse: 85 bpm, Respiratory Rate: 16 breaths/min, Blood Pressure: 110/60 mmHg. Respiratory normal breathing without difficulty. clear to auscultation bilaterally. Cardiovascular regular rate and rhythm with normal S1, S2. Psychiatric this patient is able to make decisions and demonstrates good insight into disease process. Alert and Oriented x 3. pleasant and cooperative. General Notes: Based on what I am seeing at this point I am somewhat concerned about the wound in the sacral region at this time. The left ischium seems to be doing much better. However the sacrum just is not seeming to make of the progress that I would like to see and unfortunately I think this is due to pressure to the area that still occurring. I think the patient really needs to have a Roho cushion for the chair and an air mattress if she does not already have 1. I have mentioned this before in the notes but I am not sure that the notes have made it to the facility. Integumentary (Hair, Skin) Wound #3 status is Open. Original cause of wound was Pressure Injury. The wound is located on the Left Ischium. The wound measures 0.3cm length x 0.6cm width x 0.2cm depth; 0.141cm^2  area and 0.028cm^3 volume. There is muscle and Fat Layer (Subcutaneous Tissue) Exposed exposed. There is a large amount of purulent drainage noted. The wound margin is thickened. There is medium (34-66%) pink granulation within the  wound bed. There is a medium (34-66%) amount of necrotic tissue within the wound bed including Adherent Slough. Wound #4 status is Open. Original cause of wound was Pressure Injury. The wound is located on the Midline Sacrum. The wound measures 2.2cm length x 1.6cm width x 1cm depth; 2.765cm^2 area and 2.765cm^3 volume. There is Fat Layer (Subcutaneous Tissue) Exposed exposed. There is undermining starting at 7:00 and ending at 2:00 with a maximum distance of 2.8cm. There is a medium amount of purulent drainage noted. The wound margin is indistinct and nonvisible. There is no granulation within the wound bed. There is a large (67-100%) amount of necrotic tissue within the wound bed including Adherent Slough. Assessment Active Problems ICD-10 Pressure ulcer of left heel, stage 3 Pressure ulcer of right ankle, stage 3 Pressure ulcer of left hip, stage 4 Pressure ulcer of sacral region, stage 3 Essential (primary) hypertension Vascular dementia without behavioral disturbance Bleicher, Zniya A. ([id number]) Plan Wound Cleansing: Wound #3 Left Ischium: Clean wound with Normal Saline. Cleanse wound with mild soap and water Wound #4 Midline Sacrum: Clean wound with Normal Saline. Cleanse wound with mild soap and water Anesthetic (add to Medication List): Wound #3 Left Ischium: Topical Lidocaine 4% cream applied to wound bed prior to debridement (In Clinic Only). Wound #4 Midline Sacrum: Topical Lidocaine 4% cream applied to wound bed prior to debridement (In Clinic Only). Primary Wound Dressing: Wound #3 Left Ischium: Silver Collagen Wound #4 Midline Sacrum: Other: - Dakins moistened gauze packed Secondary Dressing: Wound #3 Left Ischium: Boardered Foam Dressing Wound #4 Midline Sacrum: ABD pad - secure with tape Dressing Change Frequency: Wound #3 Left Ischium: Change dressing every other day. - As needed with incontinence Wound #4 Midline Sacrum: Change dressing every day. - as  needed with incontinence Follow-up Appointments: Wound #3 Left Ischium: Return Appointment in 3 weeks. Wound #4 Midline Sacrum: Return Appointment in 3 weeks. Off-Loading: Wound #3 Left Ischium: Heel suspension boot to: Roho cushion for wheelchair - pt needs Roho cushion for her chair Mattress - Pt needs Air mattress Turn and reposition every 2 hours - Pt is still getting pressure to her midline sacrum. Please reposition pt EVERY 2 HOURS Wound #4 Midline Sacrum: Heel suspension boot to: Roho cushion for wheelchair - pt needs Roho cushion for her chair Mattress - Pt needs Air mattress Turn and reposition every 2 hours - Pt is still getting pressure to her midline sacrum. Please reposition pt EVERY 2 HOURS 1. My suggestion at this time is good to be that we go ahead and continue with the current wound care measures which includes the Dakin's moistened gauze packed into the sacral region as I still feel like that is most appropriate. 2. We will switch to a silver collagen dressing for the left ischium since that is very small and I think the Dakin's is necessary any longer. Cofer, Janine A. ([id number]) 3. I am also going to suggest at this point that we go ahead and reiterate to the facility that the patient needs to be rotated position wise every 2 hours in order to ensure that there is no extra pressure occurring to the sacral region. She also needs to have an air mattress and really I would recommend a Roho cushion for her chair. I  did write out a note for this to send back on the facility progress note form we also sent a copy of our printed order stating this as well. We will see patient back for reevaluation in 3 weeks here in the clinic. If anything worsens or changes patient will contact our office for additional recommendations. Electronic Signature(s) Signed: 05/05/2019 5:24:01 PM By: Lenda Kelp PA-C Entered By: Lenda Kelp on 05/05/2019 17:24:01 [name]  ([id number]) -------------------------------------------------------------------------------- ROS/PFSH Details Patient Name: [name] Lye A. Date of Service: 05/05/2019 9:15 AM Medical Record Number: [id number] Patient Account Number: [id number] Date of Birth/Sex: Nov 09, 1928 (85 y.o. F) Treating RN: Rodell Perna Primary Care Provider: Dewaine Oats Other Clinician: Referring Provider: Dewaine Oats Treating Provider/Extender: STONE III, Carissa Musick Weeks in Treatment: 9 Unable to Obtain Patient History due to oo Dementia Information Obtained From Patient Constitutional Symptoms (General Health) Complaints and Symptoms: Negative for: Fatigue; Fever; Chills; Marked Weight Change Respiratory Complaints and Symptoms: Negative for: Chronic or frequent coughs; Shortness of Breath Medical History: Negative for: Aspiration; Asthma; Chronic Obstructive Pulmonary Disease (COPD); Pneumothorax; Sleep Apnea; Tuberculosis Cardiovascular Complaints and Symptoms: Negative for: Chest pain; LE edema Medical History: Positive for: Hypertension Negative for: Angina; Arrhythmia; Congestive Heart Failure; Coronary Artery Disease; Deep Vein Thrombosis; Hypotension; Myocardial Infarction; Peripheral Arterial Disease; Peripheral Venous Disease; Phlebitis; Vasculitis Psychiatric Complaints and Symptoms: Negative for: Anxiety; Claustrophobia Medical History: Negative for: Anorexia/bulimia; Confinement Anxiety Eyes Medical History: Positive for: Glaucoma Negative for: Cataracts; Optic Neuritis Ear/Nose/Mouth/Throat Medical History: Negative for: Chronic sinus problems/congestion; Middle ear problems Hematologic/Lymphatic DIAHN, WAIDELICH A. ([id number]) Medical History: Negative for: Anemia; Hemophilia; Human Immunodeficiency Virus; Lymphedema; Sickle Cell Disease Gastrointestinal Medical History: Negative for: Cirrhosis ; Colitis; Crohnos; Hepatitis A; Hepatitis B; Hepatitis C Endocrine Medical History: Negative  for: Type I Diabetes; Type II Diabetes Genitourinary Medical History: Negative for: End Stage Renal Disease Immunological Medical History: Negative for: Lupus Erythematosus; Raynaudos; Scleroderma Integumentary (Skin) Medical History: Negative for: History of Burn; History of pressure wounds Musculoskeletal Medical History: Positive for: Osteoarthritis Negative for: Gout; Rheumatoid Arthritis; Osteomyelitis Neurologic Medical History: Positive for: Dementia Negative for: Neuropathy; Quadriplegia; Paraplegia; Seizure Disorder Oncologic Medical History: Negative for: Received Chemotherapy; Received Radiation HBO Extended History Items Eyes: Glaucoma Immunizations Pneumococcal Vaccine: Received Pneumococcal Vaccination: No Implantable Devices None Family and Social History Never smoker ZARETH, RIPPETOE ([id number]) Physician Affirmation I have reviewed and agree with the above information. Electronic Signature(s) Signed: 05/05/2019 11:07:02 AM By: Rodell Perna Signed: 05/06/2019 10:58:36 AM By: Lenda Kelp PA-C Entered By: Lenda Kelp on 05/05/2019 10:04:24 [name] ([id number]) -------------------------------------------------------------------------------- SuperBill Details Patient Name: [name] Lye A. Date of Service: 05/05/2019 Medical Record Number: [id number] Patient Account Number: [id number] Date of Birth/Sex: [birth year] (85 y.o. F) Treating RN: Rodell Perna Primary Care Provider: Dewaine Oats Other Clinician: Referring Provider: Dewaine Oats Treating Provider/Extender: Linwood Dibbles, Gustave Lindeman Weeks in Treatment: 9 Diagnosis Coding ICD-10 Codes Code Description 8582757907 Pressure ulcer of left heel, stage 3 L89.513 Pressure ulcer of right ankle, stage 3 L89.224 Pressure ulcer of left hip, stage 4 L89.153 Pressure ulcer of sacral region, stage 3 I10 Essential (primary) hypertension F01.50 Vascular dementia without behavioral disturbance Facility  Procedures CPT4 Code: 81829937 Description: 99213 - WOUND CARE VISIT-LEV 3 EST PT Modifier: Quantity: 1 Physician Procedures CPT4 Code: 1696789 Description: 99214 - WC PHYS LEVEL 4 - EST PT ICD-10 Diagnosis Description L89.623 Pressure ulcer of left heel, stage 3 L89.513 Pressure ulcer of right ankle, stage 3 L89.224 Pressure ulcer of left hip, stage  4 L89.153 Pressure ulcer of sacral region,  stage 3 Modifier: Quantity: 1 Electronic Signature(s) Signed: 05/05/2019 5:24:14 PM By: Lenda Kelp PA-C Entered By: Lenda Kelp on 05/05/2019 17:24:14

## 2019-05-09 NOTE — Progress Notes (Signed)
[name], [name] ([id number]) Visit Report for 05/05/2019 Arrival Information Details Patient Name: [name] [name], [name] Date of Service: 05/05/2019 9:15 AM Medical Record Number: [id number] Patient Account Number: [id number] Date of Birth/Sex: Feb 24, 1929 (85 y.o. F) Treating RN: Army Melia Primary Care Sathvik Tiedt: Benita Stabile Other Clinician: Referring Verania Salberg: Benita Stabile Treating Eliazer Hemphill/Extender: Melburn Hake, HOYT Weeks in Treatment: 9 Visit Information History Since Last Visit Added or deleted any medications: No Patient Arrived: Wheel Chair Any new allergies or adverse reactions: No Arrival Time: 09:13 Had a fall or experienced change in No Accompanied By: self activities of daily living that may affect Transfer Assistance: Harrel Lemon Lift risk of falls: Patient Identification Verified: Yes Signs or symptoms of abuse/neglect since last visito No Secondary Verification Process Completed: Yes Hospitalized since last visit: No Implantable device outside of the clinic excluding No cellular tissue based products placed in the center since last visit: Has Dressing in Place as Prescribed: Yes Pain Present Now: No Electronic Signature(s) Signed: 05/06/2019 3:50:05 PM By: Gretta Cool, BSN, RN, CWS, Kim RN, BSN Entered By: Gretta Cool, BSN, RN, CWS, Kim on 05/06/2019 15:50:05 Susa Griffins ([id number]) -------------------------------------------------------------------------------- Clinic Level of Care Assessment Details Patient Name: [name] Roan A. Date of Service: 05/05/2019 9:15 AM Medical Record Number: [id number] Patient Account Number: [id number] Date of Birth/Sex: Apr 12, 1929 (85 y.o. F) Treating RN: Army Melia Primary Care Chiron Campione: Benita Stabile Other Clinician: Referring Amelia Macken: Benita Stabile Treating Raeonna Milo/Extender: Melburn Hake, HOYT Weeks in Treatment: 9 Clinic Level of Care Assessment Items TOOL 4 Quantity Score []  - Use when only an EandM is performed on FOLLOW-UP visit 0 ASSESSMENTS -  Nursing Assessment / Reassessment X - Reassessment of Co-morbidities (includes updates in patient status) 1 10 X- 1 5 Reassessment of Adherence to Treatment Plan ASSESSMENTS - Wound and Skin Assessment / Reassessment []  - Simple Wound Assessment / Reassessment - one wound 0 X- 2 5 Complex Wound Assessment / Reassessment - multiple wounds []  - 0 Dermatologic / Skin Assessment (not related to wound area) ASSESSMENTS - Focused Assessment []  - Circumferential Edema Measurements - multi extremities 0 []  - 0 Nutritional Assessment / Counseling / Intervention []  - 0 Lower Extremity Assessment (monofilament, tuning fork, pulses) []  - 0 Peripheral Arterial Disease Assessment (using hand held doppler) ASSESSMENTS - Ostomy and/or Continence Assessment and Care []  - Incontinence Assessment and Management 0 []  - 0 Ostomy Care Assessment and Management (repouching, etc.) PROCESS - Coordination of Care X - Simple Patient / Family Education for ongoing care 1 15 []  - 0 Complex (extensive) Patient / Family Education for ongoing care []  - 0 Staff obtains Programmer, systems, Records, Test Results / Process Orders []  - 0 Staff telephones HHA, Nursing Homes / Clarify orders / etc []  - 0 Routine Transfer to another Facility (non-emergent condition) []  - 0 Routine Hospital Admission (non-emergent condition) []  - 0 New Admissions / Biomedical engineer / Ordering NPWT, Apligraf, etc. []  - 0 Emergency Hospital Admission (emergent condition) X- 1 10 Simple Discharge Coordination [name], [name] ([id number]) []  - 0 Complex (extensive) Discharge Coordination PROCESS - Special Needs []  - Pediatric / Minor Patient Management 0 []  - 0 Isolation Patient Management []  - 0 Hearing / Language / Visual special needs []  - 0 Assessment of Community assistance (transportation, D/C planning, etc.) []  - 0 Additional assistance / Altered mentation []  - 0 Support Surface(s) Assessment (bed, cushion, seat,  etc.) INTERVENTIONS - Wound Cleansing / Measurement []  - Simple Wound Cleansing - one wound 0 X- 2 5 Complex Wound Cleansing -  multiple wounds X- 1 5 Wound Imaging (photographs - any number of wounds) []  - 0 Wound Tracing (instead of photographs) []  - 0 Simple Wound Measurement - one wound X- 2 5 Complex Wound Measurement - multiple wounds INTERVENTIONS - Wound Dressings []  - Small Wound Dressing one or multiple wounds 0 X- 2 15 Medium Wound Dressing one or multiple wounds []  - 0 Large Wound Dressing one or multiple wounds []  - 0 Application of Medications - topical []  - 0 Application of Medications - injection INTERVENTIONS - Miscellaneous []  - External ear exam 0 []  - 0 Specimen Collection (cultures, biopsies, blood, body fluids, etc.) []  - 0 Specimen(s) / Culture(s) sent or taken to Lab for analysis []  - 0 Patient Transfer (multiple staff / / Similar devices) []  - 0 Simple Staple / Suture removal (25 or less) []  - 0 Complex Staple / Suture removal (26 or more) []  - 0 Hypo / Hyperglycemic Management (close monitor of Blood Glucose) []  - 0 Ankle / Brachial Index (ABI) - do not check if billed separately X- 1 5 Vital Signs Mezo, Evelin A. ( ) Has the patient been seen at the hospital within the last three years: Yes Total Score: 110 Level Of Care: New/Established - Level 3 Electronic Signature(s) Signed: 05/05/2019 11:07:02 AM By: Entered By: on 05/05/2019 09:57:52 [name], [name] ( ) -------------------------------------------------------------------------------- Encounter Discharge Information Details Patient Name: A. Date of Service: 05/05/2019 9:15 AM Medical Record Number: Patient Account Number: Nurse, adult Date of Birth/Sex: 1928-12-01 (85 y.o. F) Treating RN: Primary Care Breeona Waid: Other Clinician: Referring Britnay Magnussen: [phone] Treating Gabryela Kimbrell/Extender:  05/07/2019, HOYT Weeks in Treatment: 9 Encounter Discharge Information Items Discharge Condition: Stable Ambulatory Status: Wheelchair Discharge Destination: Skilled Nursing Facility Telephoned: No Orders Sent: Yes Transportation: Private Auto Accompanied By: self Schedule Follow-up Appointment: Yes Clinical Summary of Care: Electronic Signature(s) Signed: 05/05/2019 11:07:02 AM By: Rodell Perna Entered By: 05/07/2019 on 05/05/2019 09:59:09 [name] (05/07/2019) -------------------------------------------------------------------------------- Lower Extremity Assessment Details Patient Name: [id number] A. Date of Service: 05/05/2019 9:15 AM Medical Record Number: 10/21/1928 Patient Account Number: [id number] Date of Birth/Sex: Jun 14, 1928 (85 y.o. F) Treating RN: Dewaine Oats Primary Care Carmisha Larusso: Linwood Dibbles Other Clinician: Referring Nalee Lightle: 05/07/2019 Treating Sabriel Borromeo/Extender: Rodell Perna, HOYT Weeks in Treatment: 9 Electronic Signature(s) Signed: 05/06/2019 3:51:00 PM By: 05/07/2019, BSN, RN, CWS, Kim RN, BSN Signed: 05/09/2019 9:22:11 AM By: [id number] Previous Signature: 05/05/2019 11:07:02 AM Version By: 05/07/2019 Entered By: [id number] BSN, RN, CWS, Kim on 05/06/2019 15:51:00 10/21/1928 A. (98) -------------------------------------------------------------------------------- Multi Wound Chart Details Patient Name: Rodell Perna A. Date of Service: 05/05/2019 9:15 AM Medical Record Number: Dewaine Oats Patient Account Number: Linwood Dibbles Date of Birth/Sex: 1928-07-11 (85 y.o. F) Treating RN: 05/11/2019 Primary Care Louiza Moor: Rodell Perna Other Clinician: Referring Endora Teresi: 05/07/2019 Treating Carys Malina/Extender: STONE III, HOYT Weeks in Treatment: 9 Vital Signs Height(in): 67 Pulse(bpm): 85 Weight(lbs): 140 Blood Pressure(mmHg): 110/60 Body Mass Index(BMI): 22 Temperature(F): 99.2 Respiratory Rate 16 (breaths/min): Photos: [N/A:N/A] Wound Location:  Left Ischium Sacrum - Midline N/A Wounding Event: Pressure Injury Pressure Injury N/A Primary Etiology: Pressure Ulcer Pressure Ulcer N/A Comorbid History: Glaucoma, Hypertension, Glaucoma, Hypertension, N/A Osteoarthritis, Dementia Osteoarthritis, Dementia Date Acquired: 07/18/2018 07/18/2018 N/A Weeks of Treatment: 9 9 N/A Wound Status: Open Open N/A Clustered Wound: No Yes N/A Clustered Quantity: N/A 3 N/A Measurements L x W x D 0.3x0.6x0.2 2.2x1.6x1 N/A (cm) Area (cm) : 0.141 2.765 N/A  Volume (cm) : 0.028 2.765 N/A % Reduction in Area: 98.80% 84.00% N/A % Reduction in Volume: 99.90% -60.00% N/A Starting Position 1 7 (o'clock): Ending Position 1 2 (o'clock): Maximum Distance 1 (cm): 2.8 Undermining: N/A Yes N/A Classification: Category/Stage IV Category/Stage III N/A Exudate Amount: Large Medium N/A Exudate Type: Purulent Purulent N/A Exudate Color: yellow, brown, green yellow, brown, green N/A Wound Margin: Thickened Indistinct, nonvisible N/A Granulation Amount: Medium (34-66%) None Present (0%) N/A Granulation Quality: Pink N/A N/A Streiff, Maryanna A. ([id number]) Necrotic Amount: Medium (34-66%) Large (67-100%) N/A Exposed Structures: Fat Layer (Subcutaneous Fat Layer (Subcutaneous N/A Tissue) Exposed: Yes Tissue) Exposed: Yes Muscle: Yes Fascia: No Fascia: No Tendon: No Tendon: No Muscle: No Joint: No Joint: No Bone: No Bone: No Epithelialization: None Medium (34-66%) N/A Treatment Notes Electronic Signature(s) Signed: 05/05/2019 11:07:02 AM By: Rodell Perna Entered By: Rodell Perna on 05/05/2019 09:55:01 [name] ([id number]) -------------------------------------------------------------------------------- Multi-Disciplinary Care Plan Details Patient Name: [name] Date of Service: 05/05/2019 9:15 AM Medical Record Number: [id number] Patient Account Number: [id number] Date of Birth/Sex: 1929/04/15 (85 y.o. F) Treating RN: Rodell Perna Primary Care  Ladene Allocca: Dewaine Oats Other Clinician: Referring Necha Harries: Dewaine Oats Treating Daneille Desilva/Extender: Linwood Dibbles, HOYT Weeks in Treatment: 9 Active Inactive Abuse / Safety / Falls / Self Care Management Nursing Diagnoses: Potential for falls Goals: Patient will remain injury free related to falls Date Initiated: 03/03/2019 Target Resolution Date: 03/16/2019 Goal Status: Active Interventions: Assess fall risk on admission and as needed Notes: Medication Nursing Diagnoses: Knowledge deficit related to medication safety: actual or potential Goals: Patient/caregiver will demonstrate understanding of all current medications Date Initiated: 03/03/2019 Target Resolution Date: 03/16/2019 Goal Status: Active Interventions: Assess for medication contraindications each visit where new medications are prescribed Treatment Activities: New medication prescribed at Wound Center : 03/03/2019 Notes: Necrotic Tissue Nursing Diagnoses: Impaired tissue integrity related to necrotic/devitalized tissue Goals: Necrotic/devitalized tissue will be minimized in the wound bed Date Initiated: 03/03/2019 Target Resolution Date: 03/16/2019 Goal Status: Active ANIYLAH, AVANS A. ([id number]) Interventions: Assess patient pain level pre-, during and post procedure and prior to discharge Treatment Activities: Apply topical anesthetic as ordered : 03/03/2019 Notes: Nutrition Nursing Diagnoses: Potential for alteratiion in Nutrition/Potential for imbalanced nutrition Goals: Patient/caregiver agrees to and verbalizes understanding of need to use nutritional supplements and/or vitamins as prescribed Date Initiated: 03/03/2019 Target Resolution Date: 03/16/2019 Goal Status: Active Interventions: Provide education on nutrition Notes: Orientation to the Wound Care Program Nursing Diagnoses: Knowledge deficit related to the wound healing center program Goals: Patient/caregiver will verbalize understanding of  the Wound Healing Center Program Date Initiated: 03/03/2019 Target Resolution Date: 03/16/2019 Goal Status: Active Interventions: Provide education on orientation to the wound center Notes: Pressure Nursing Diagnoses: Knowledge deficit related to management of pressures ulcers Goals: Patient/caregiver will verbalize understanding of pressure ulcer management Date Initiated: 03/03/2019 Target Resolution Date: 03/16/2019 Goal Status: Active Interventions: Assess: immobility, friction, shearing, incontinence upon admission and as needed Provide education on pressure ulcers Treatment Activities: Patient referred for home evaluation of offloading devices/mattresses : 03/03/2019 ANTONELLA, UPSON A. ([id number]) Patient referred for pressure reduction/relief devices : 03/03/2019 Pressure reduction/relief device ordered : 03/03/2019 Notes: Wound/Skin Impairment Nursing Diagnoses: Impaired tissue integrity Goals: Ulcer/skin breakdown will have a volume reduction of 30% by week 4 Date Initiated: 03/03/2019 Target Resolution Date: 03/03/2019 Goal Status: Active Interventions: Assess ulceration(s) every visit Treatment Activities: Skin care regimen initiated : 03/03/2019 Notes: Electronic Signature(s) Signed: 05/05/2019 11:07:02 AM By: Rodell Perna Entered By: Rodell Perna on 05/05/2019 09:54:51  BELKIS, NORBECK A. ([id number]) -------------------------------------------------------------------------------- Pain Assessment Details Patient Name: PARTHENIA, TELLEFSEN A. Date of Service: 05/05/2019 9:15 AM Medical Record Number: [id number] Patient Account Number: [id number] Date of Birth/Sex: 07-07-1928 (85 y.o. F) Treating RN: Rodell Perna Primary Care Cris Talavera: Dewaine Oats Other Clinician: Referring Malya Cirillo: Dewaine Oats Treating [name] [name]/Extender: Linwood Dibbles, HOYT Weeks in Treatment: 9 Active Problems Location of Pain Severity and Description of Pain Patient Has Paino No Site Locations Pain  Management and Medication Current Pain Management: Electronic Signature(s) Signed: 05/06/2019 3:50:12 PM By: Elliot Gurney, BSN, RN, CWS, Kim RN, BSN Signed: 05/09/2019 9:22:11 AM By: Rodell Perna Previous Signature: 05/05/2019 11:07:02 AM Version By: Rodell Perna Entered By: Elliot Gurney BSN, RN, CWS, Kim on 05/06/2019 15:50:12 [name] ([id number]) -------------------------------------------------------------------------------- Patient/Caregiver Education Details Patient Name: [name] Date of Service: 05/05/2019 9:15 AM Medical Record Number: [id number] Patient Account Number: [id number] Date of Birth/Gender: [birth year] (85 y.o. F) Treating RN: Rodell Perna Primary Care Physician: Dewaine Oats Other Clinician: Referring Physician: Dewaine Oats Treating Physician/Extender: Skeet Simmer in Treatment: 9 Education Assessment Education Provided To: Patient Education Topics Provided Wound/Skin Impairment: Handouts: Caring for Your Ulcer Methods: Demonstration, Explain/Verbal Responses: State content correctly Electronic Signature(s) Signed: 05/05/2019 11:07:02 AM By: Rodell Perna Entered By: Rodell Perna on 05/05/2019 09:58:12 Eckerson, Kamalei A. ([id number]) -------------------------------------------------------------------------------- Wound Assessment Details Patient Name: [name] Date of Service: 05/05/2019 9:15 AM Medical Record Number: [id number] Patient Account Number: [id number] Date of Birth/Sex: 02-22-1929 (85 y.o. F) Treating RN: Rodell Perna Primary Care Rosary Filosa: Dewaine Oats Other Clinician: Referring Reymond Maynez: Dewaine Oats Treating Haylee Mcanany/Extender: STONE III, HOYT Weeks in Treatment: 9 Wound Status Wound Number: 3 Primary Pressure Ulcer Etiology: Wound Location: Left Ischium Wound Status: Open Wounding Event: Pressure Injury Comorbid Glaucoma, Hypertension, Osteoarthritis, Date Acquired: 07/18/2018 History: Dementia Weeks Of Treatment: 9 Clustered Wound:  No Photos Wound Measurements Length: (cm) 0.3 % Reduction Width: (cm) 0.6 % Reduction Depth: (cm) 0.2 Epithelializ Area: (cm) 0.141 Volume: (cm) 0.028 in Area: 98.8% in Volume: 99.9% ation: None Wound Description Classification: Category/Stage IV Foul Odor A Wound Margin: Thickened Slough/Fibr Exudate Amount: Large Exudate Type: Purulent Exudate Color: yellow, brown, green fter Cleansing: No ino Yes Wound Bed Granulation Amount: Medium (34-66%) Exposed Structure Granulation Quality: Pink Fascia Exposed: No Necrotic Amount: Medium (34-66%) Fat Layer (Subcutaneous Tissue) Exposed: Yes Necrotic Quality: Adherent Slough Tendon Exposed: No Muscle Exposed: Yes Necrosis of Muscle: No Joint Exposed: No Bone Exposed: No Treatment Notes Farner, Romayne A. ([id number]) Wound #3 (Left Ischium) Notes Dakins gauze with ABD and tape to midline sacrum, Silver collagen with BFD to trochanter Electronic Signature(s) Signed: 05/06/2019 3:50:34 PM By: Elliot Gurney, BSN, RN, CWS, Kim RN, BSN Signed: 05/09/2019 9:22:11 AM By: Rodell Perna Previous Signature: 05/05/2019 11:07:02 AM Version By: Rodell Perna Entered By: Elliot Gurney BSN, RN, CWS, Kim on 05/06/2019 15:50:34 Belen, Ciarah A. ([id number]) -------------------------------------------------------------------------------- Wound Assessment Details Patient Name: [name] Date of Service: 05/05/2019 9:15 AM Medical Record Number: [id number] Patient Account Number: [id number] Date of Birth/Sex: 09/26/1928 (85 y.o. F) Treating RN: Rodell Perna Primary Care Aretta Stetzel: Dewaine Oats Other Clinician: Referring Dalis Beers: Dewaine Oats Treating Mesiah Manzo/Extender: STONE III, HOYT Weeks in Treatment: 9 Wound Status Wound Number: 4 Primary Pressure Ulcer Etiology: Wound Location: Sacrum - Midline Wound Status: Open Wounding Event: Pressure Injury Comorbid Glaucoma, Hypertension, Osteoarthritis, Date Acquired: 07/18/2018 History: Dementia Weeks Of  Treatment: 9 Clustered Wound: Yes Photos Wound Measurements Length: (cm) 2.2 Width: (cm) 1.6 Depth: (cm) 1 Clustered Quantity: 3 Area: (cm) 2.765 Volume: (cm) 2.765 %  Reduction in Area: 84% % Reduction in Volume: -60% Epithelialization: Medium (34-66%) Undermining: Yes Starting Position (o'clock): 7 Ending Position (o'clock): 2 Maximum Distance: (cm) 2.8 Wound Description Classification: Category/Stage III Wound Margin: Indistinct, nonvisible Exudate Amount: Medium Exudate Type: Purulent Exudate Color: yellow, brown, green Foul Odor After Cleansing: No Slough/Fibrino Yes Wound Bed Granulation Amount: None Present (0%) Exposed Structure Necrotic Amount: Large (67-100%) Fascia Exposed: No Necrotic Quality: Adherent Slough Fat Layer (Subcutaneous Tissue) Exposed: Yes Tendon Exposed: No Muscle Exposed: No Joint Exposed: No Bone Exposed: No Sadiq, Shatara A. ([id number]) Treatment Notes Wound #4 (Midline Sacrum) Notes Dakins gauze with ABD and tape to midline sacrum, Silver collagen with BFD to trochanter Electronic Signature(s) Signed: 05/06/2019 3:50:45 PM By: Elliot GurneyWoody, BSN, RN, CWS, Kim RN, BSN Signed: 05/09/2019 9:22:11 AM By: Rodell PernaScott, Dajea Previous Signature: 05/05/2019 11:07:02 AM Version By: Rodell PernaScott, Dajea Entered By: Elliot GurneyWoody, BSN, RN, CWS, Kim on 05/06/2019 15:50:45 Baise, Marylouise StacksWILMA A. ([id number]) -------------------------------------------------------------------------------- Vitals Details Patient Name: Christene LyeHALL, Tomekia A. Date of Service: 05/05/2019 9:15 AM Medical Record Number: [id number] Patient Account Number: [id number] Date of Birth/Sex: 06/28/1928 (85 y.o. F) Treating RN: Rodell PernaScott, Dajea Primary Care Lyndon Chenoweth: Dewaine OatsATE, DENNY Other Clinician: Referring Melicia Esqueda: TATE, Katherina RightENNY Treating Tricia Oaxaca/Extender: STONE III, HOYT Weeks in Treatment: 9 Vital Signs Time Taken: 09:15 Temperature (F): 99.2 Height (in): 67 Pulse (bpm): 85 Weight (lbs): 140 Respiratory Rate  (breaths/min): 16 Body Mass Index (BMI): 21.9 Blood Pressure (mmHg): 110/60 Reference Range: 80 - 120 mg / dl Electronic Signature(s) Signed: 05/06/2019 3:50:15 PM By: Elliot GurneyWoody, BSN, RN, CWS, Kim RN, BSN Entered By: Elliot GurneyWoody, BSN, RN, CWS, Kim on 05/06/2019 15:50:15

## 2019-05-26 ENCOUNTER — Ambulatory Visit: Payer: Medicare Other | Admitting: Physician Assistant

## 2019-06-03 ENCOUNTER — Ambulatory Visit: Payer: Medicare Other | Admitting: Physician Assistant

## 2019-06-24 ENCOUNTER — Encounter: Payer: Medicare Other | Attending: Physician Assistant | Admitting: Physician Assistant

## 2019-06-24 ENCOUNTER — Other Ambulatory Visit: Payer: Self-pay

## 2019-06-24 DIAGNOSIS — L89153 Pressure ulcer of sacral region, stage 3: Secondary | ICD-10-CM | POA: Insufficient documentation

## 2019-06-24 DIAGNOSIS — I10 Essential (primary) hypertension: Secondary | ICD-10-CM | POA: Diagnosis not present

## 2019-06-24 DIAGNOSIS — F0150 Vascular dementia without behavioral disturbance: Secondary | ICD-10-CM | POA: Diagnosis not present

## 2019-06-24 NOTE — Progress Notes (Addendum)
[name] [name] ([id number]) Visit Report for 06/24/2019 Chief Complaint Document Details Patient Name: [name] [name] A. Date of Service: 06/24/2019 9:30 AM Medical Record Number: [id number] Patient Account Number: [id number] Date of Birth/Sex: [birth year] (86 y.o. F) Treating RN: Curtis Sites Primary Care Provider: Dewaine Oats Other Clinician: Referring Provider: Dewaine Oats Treating Provider/Extender: Linwood Dibbles, Miri Jose Weeks in Treatment: 16 Information Obtained from: Patient Chief Complaint Multiple pressure ulcers Electronic Signature(s) Signed: 06/24/2019 10:35:11 AM By: Lenda Kelp PA-C Entered By: Lenda Kelp on 06/24/2019 10:35:10 [name] [name] A. ([id number]) -------------------------------------------------------------------------------- HPI Details Patient Name: [name] [name] A. Date of Service: 06/24/2019 9:30 AM Medical Record Number: [id number] Patient Account Number: [id number] Date of Birth/Sex: 1928/06/06 (86 y.o. F) Treating RN: Curtis Sites Primary Care Provider: Dewaine Oats Other Clinician: Referring Provider: Dewaine Oats Treating Provider/Extender: Linwood Dibbles, Sumedh Shinsato Weeks in Treatment: 16 History of Present Illness HPI Description: 03/03/2019 on evaluation today patient presents for initial evaluation here in the clinic today concerning issues that she has been having with wounds which are pressure in nature at multiple locations currently. This is on her left heel, right ankle, left hip/ischial location, and all her to varying degrees of severity and depth. The worst is on the left ischial location. At this time the patient has been tolerating dressing changes although I am not exactly sure the dressings that have been utilized prior to coming in today. She does have some necrotic tissue noted at several locations this can require at least some degree of cleaning prior to application of dressings going forward. The patient does have dementia and unfortunately in recent  months has become increasingly immobile requiring more significant treatment she was just recently moved roughly 2 weeks ago from assisted living to a skilled nursing facility at [facility]. Based on what I am seeing currently these pressure injuries occurred prior to that 2-week time I do not see any new or obvious pressure injury at this point. I discussed with the patient as well as her family member that this does appear to be doing better in my opinion than likely where things started. They are unsure as to whether or not she has an air mattress at the facility I do think that would be something that would be appropriate and good for her to have but at the moment I do not know for sure whether that is already in place if not that definitely is can be 1 of the recommendations. Also think a Roho cushion for her wheelchair would be good as well as Prevalon offloading boots. The patient currently does not have any severe pain although when I was cleaning some of the areas she did note there was some discomfort at several of the locations. She does have a history of hypertension as well. 10/29; this is a patient from [facility] nursing home. She is nonambulatory. She is here for follow-up on 4 different wounds 1 over the left lateral malleolus, the right medial calcaneus, the left posterior hip and I believe a new area over the sacrum. The patient is nonambulatory but apparently eats well. 03/24/2019 on evaluation today patient actually appears to be doing better with regard to her wounds in general based on what I am seeing. Fortunately there is no signs of active infection at this time. No fever chills noted. Overall been very pleased with the progress that seems to be occurring since I last saw her. There is a little bit of debridement That will need to be performed  today. 04/07/2019 on evaluation today patient actually appears to be doing well in some regards based on what I am seeing  today. Fortunately there is no signs of active infection at this time which is good news. No fever chills noted. With that being said the wounds on her foot and ankle region in general have healed at this point. I do not see anything open in that region. With regard to the other 2 wounds in the left ischium and midline sacral region I believe that we may need to make a dressing change at this point to do something a little bit more conducive to cleaning up the wound bed. The patient is in agreement with that plan today. 04/21/2019 on evaluation today patient actually appears to be doing well with regard to her wounds all things considered. Both appear to be much cleaner than what they were previous. Fortunately there is no signs of active infection at this time. Both I think are doing well with the current wound care measures which is the Dakin moistened gauze dressings. 05/05/2019 on evaluation today patient seems to be doing much better in regard to her ischial ulcer unfortunately she still seems to be getting pressure to the sacral region. I think that this is evidenced by the fact that she does have some surrounding deep tissue injury and the fact that the wound is actually measuring larger not better. Fortunately there is no evidence of active infection at this time I do believe the Dakin's is still the best option treatment wise but again I do think the patient needs more appropriate offloading on the sacral region. 06/24/2019 on evaluation today patient appears to be doing well with regard to the wound over the ischium which appears to be completely healed. This is great news. Fortunately there does appear to be some improvement with regard to the sacral wound as well. There still does seem to be evidence of necrotic tissue noted minimally that I was able to gently clean away. With that being said the wound itself seems to be doing quite well which is good news. She seems to be  offloaded appropriately in my opinion at this point. I think she may be ready for a wound VAC. [name] [name] ([id number]) Electronic Signature(s) Signed: 06/24/2019 10:48:16 AM By: Lenda Kelp PA-C Entered By: Lenda Kelp on 06/24/2019 10:48:15 [name] ([id number]) -------------------------------------------------------------------------------- Physical Exam Details Patient Name: [name] [name] A. Date of Service: 06/24/2019 9:30 AM Medical Record Number: [id number] Patient Account Number: [id number] Date of Birth/Sex: 1928-08-18 (86 y.o. F) Treating RN: Curtis Sites Primary Care Provider: Dewaine Oats Other Clinician: Referring Provider: TATE, Katherina Right Treating Provider/Extender: STONE III, Vannah Nadal Weeks in Treatment: 16 Constitutional Well-nourished and well-hydrated in no acute distress. Respiratory normal breathing without difficulty. Psychiatric Patient is not able to cooperate in decision making regarding care. Patient has dementia. patient is confused. Notes Patient's wound bed currently showed signs of good granulation at this time. Fortunately there is no evidence of active infection currently. No fevers, chills, nausea, vomiting, or diarrhea. Overall I do believe that the wound in the sacral region is definitely ready for a wound VAC at this point I am to go ahead and order that today. Electronic Signature(s) Signed: 06/24/2019 10:49:00 AM By: Lenda Kelp PA-C Entered By: Lenda Kelp on 06/24/2019 10:49:00 [name] ([id number]) -------------------------------------------------------------------------------- Physician Orders Details Patient Name: [name] [name] A. Date of Service: 06/24/2019 9:30 AM Medical Record Number: [id number] Patient Account Number: [id number]  Date of Birth/Sex: 10-Aug-1928 (86 y.o. F) Treating RN: Montey Hora Primary Care Provider: Benita Stabile Other Clinician: Referring Provider: Benita Stabile Treating Provider/Extender: Melburn Hake,  Trystian Crisanto Weeks in Treatment: 16 Verbal / Phone Orders: No Diagnosis Coding ICD-10 Coding Code Description L89.623 Pressure ulcer of left heel, stage 3 L89.513 Pressure ulcer of right ankle, stage 3 L89.224 Pressure ulcer of left hip, stage 4 L89.153 Pressure ulcer of sacral region, stage 3 I10 Essential (primary) hypertension F01.50 Vascular dementia without behavioral disturbance Wound Cleansing Wound #4 Midline Sacrum o Clean wound with Normal Saline. o Cleanse wound with mild soap and water Primary Wound Dressing Wound #4 Midline Sacrum o Other: - Dakins moistened gauze packed - Continue this until NPWT is available Secondary Dressing Wound #4 Midline Sacrum o ABD pad - secure with tape - Continue this until NPWT is available Dressing Change Frequency Wound #4 Midline Sacrum o Change Dressing Monday, Wednesday, Friday Follow-up Appointments Wound #4 Midline Sacrum o Return Appointment in 2 weeks. Off-Loading Wound #4 Midline Sacrum o Roho cushion for wheelchair - pt needs Roho cushion for her chair o Mattress - Pt needs Air mattress o Turn and reposition every 2 hours - Pt is still getting pressure to her midline sacrum. Please reposition pt EVERY 2 HOURS Negative Pressure Wound Therapy Wound #4 Midline Sacrum Swenson, Zemirah A. ([id number]) o Wound VAC settings at 125/130 mmHg continuous pressure. Use BLACK/GREEN foam to wound cavity. Use WHITE foam to fill any tunnel/s and/or undermining. Change VAC dressing 3 X WEEK. Change canister as indicated when full. Nurse may titrate settings and frequency of dressing changes as clinically indicated. - SNF nurses please initiate NPWT asap. o Home Health Nurse may d/c VAC for s/s of increased infection, significant wound regression, or uncontrolled drainage. [facility] at [phone]. o Number of foam/gauze pieces used in the dressing = - Please bridge NPWT to either side Electronic  Signature(s) Signed: 06/24/2019 1:41:09 PM By: Montey Hora Signed: 06/24/2019 1:53:22 PM By: Worthy Keeler PA-C Entered By: Montey Hora on 06/24/2019 10:44:44 Ederer, Darryll Capers A. ([id number]) -------------------------------------------------------------------------------- Problem List Details Patient Name: [name] Date of Service: 06/24/2019 9:30 AM Medical Record Number: [id number] Patient Account Number: [id number] Date of Birth/Sex: 02/17/1929 (86 y.o. F) Treating RN: Montey Hora Primary Care Provider: Benita Stabile Other Clinician: Referring Provider: Benita Stabile Treating Provider/Extender: Melburn Hake, Malaiah Viramontes Weeks in Treatment: 16 Active Problems ICD-10 Evaluated Encounter Code Description Active Date Today Diagnosis L89.623 Pressure ulcer of left heel, stage 3 03/03/2019 No Yes L89.513 Pressure ulcer of right ankle, stage 3 03/03/2019 No Yes L89.224 Pressure ulcer of left hip, stage 4 03/03/2019 No Yes L89.153 Pressure ulcer of sacral region, stage 3 03/24/2019 No Yes I10 Essential (primary) hypertension 03/03/2019 No Yes F01.50 Vascular dementia without behavioral disturbance 03/03/2019 No Yes Inactive Problems Resolved Problems Electronic Signature(s) Signed: 06/24/2019 10:35:05 AM By: Worthy Keeler PA-C Entered By: Worthy Keeler on 06/24/2019 10:35:05 [name] ([id number]) -------------------------------------------------------------------------------- Progress Note Details Patient Name: [name] Date of Service: 06/24/2019 9:30 AM Medical Record Number: [id number] Patient Account Number: [id number] Date of Birth/Sex: 02/14/1929 (86 y.o. F) Treating RN: Montey Hora Primary Care Provider: Benita Stabile Other Clinician: Referring Provider: Benita Stabile Treating Provider/Extender: Melburn Hake, Naveyah Iacovelli Weeks in Treatment: 16 Subjective Chief Complaint Information obtained from Patient Multiple pressure ulcers History of Present Illness (HPI) 03/03/2019 on  evaluation today patient presents for initial evaluation here in the clinic today concerning issues that she has been having  with wounds which are pressure in nature at multiple locations currently. This is on her left heel, right ankle, left hip/ischial location, and all her to varying degrees of severity and depth. The worst is on the left ischial location. At this time the patient has been tolerating dressing changes although I am not exactly sure the dressings that have been utilized prior to coming in today. She does have some necrotic tissue noted at several locations this can require at least some degree of cleaning prior to application of dressings going forward. The patient does have dementia and unfortunately in recent months has become increasingly immobile requiring more significant treatment she was just recently moved roughly 2 weeks ago from assisted living to a skilled nursing facility at [facility]. Based on what I am seeing currently these pressure injuries occurred prior to that 2-week time I do not see any new or obvious pressure injury at this point. I discussed with the patient as well as her family member that this does appear to be doing better in my opinion than likely where things started. They are unsure as to whether or not she has an air mattress at the facility I do think that would be something that would be appropriate and good for her to have but at the moment I do not know for sure whether that is already in place if not that definitely is can be 1 of the recommendations. Also think a Roho cushion for her wheelchair would be good as well as Prevalon offloading boots. The patient currently does not have any severe pain although when I was cleaning some of the areas she did note there was some discomfort at several of the locations. She does have a history of hypertension as well. 10/29; this is a patient from [facility] nursing home. She is nonambulatory. She  is here for follow-up on 4 different wounds 1 over the left lateral malleolus, the right medial calcaneus, the left posterior hip and I believe a new area over the sacrum. The patient is nonambulatory but apparently eats well. 03/24/2019 on evaluation today patient actually appears to be doing better with regard to her wounds in general based on what I am seeing. Fortunately there is no signs of active infection at this time. No fever chills noted. Overall been very pleased with the progress that seems to be occurring since I last saw her. There is a little bit of debridement That will need to be performed today. 04/07/2019 on evaluation today patient actually appears to be doing well in some regards based on what I am seeing today. Fortunately there is no signs of active infection at this time which is good news. No fever chills noted. With that being said the wounds on her foot and ankle region in general have healed at this point. I do not see anything open in that region. With regard to the other 2 wounds in the left ischium and midline sacral region I believe that we may need to make a dressing change at this point to do something a little bit more conducive to cleaning up the wound bed. The patient is in agreement with that plan today. 04/21/2019 on evaluation today patient actually appears to be doing well with regard to her wounds all things considered. Both appear to be much cleaner than what they were previous. Fortunately there is no signs of active infection at this time. Both I think are doing well with the current wound care measures  which is the Dakin moistened gauze dressings. 05/05/2019 on evaluation today patient seems to be doing much better in regard to her ischial ulcer unfortunately she still seems to be getting pressure to the sacral region. I think that this is evidenced by the fact that she does have some surrounding deep tissue injury and the fact that the wound is actually  measuring larger not better. Fortunately there is no evidence of active infection at this time I do believe the Dakin's is still the best option treatment wise but again I do think the patient needs more appropriate offloading on the sacral region. [name] [name], [name] ([id number]) 06/24/2019 on evaluation today patient appears to be doing well with regard to the wound over the ischium which appears to be completely healed. This is great news. Fortunately there does appear to be some improvement with regard to the sacral wound as well. There still does seem to be evidence of necrotic tissue noted minimally that I was able to gently clean away. With that being said the wound itself seems to be doing quite well which is good news. She seems to be offloaded appropriately in my opinion at this point. I think she may be ready for a wound VAC. Objective Constitutional Well-nourished and well-hydrated in no acute distress. Vitals Time Taken: 7:38 PM, Height: 67 in, Weight: 140 lbs, BMI: 21.9, Temperature: 98.1 F, Pulse: 84 bpm, Respiratory Rate: 16 breaths/min, Blood Pressure: 112/78 mmHg. Respiratory normal breathing without difficulty. Psychiatric Patient is not able to cooperate in decision making regarding care. Patient has dementia. patient is confused. General Notes: Patient's wound bed currently showed signs of good granulation at this time. Fortunately there is no evidence of active infection currently. No fevers, chills, nausea, vomiting, or diarrhea. Overall I do believe that the wound in the sacral region is definitely ready for a wound VAC at this point I am to go ahead and order that today. Integumentary (Hair, Skin) Wound #3 status is Healed - Epithelialized. Original cause of wound was Pressure Injury. The wound is located on the Left Ischium. The wound measures 0cm length x 0cm width x 0cm depth; 0cm^2 area and 0cm^3 volume. There is no tunneling or undermining noted. There is a none  present amount of drainage noted. The wound margin is thickened. There is no granulation within the wound bed. There is a small (1-33%) amount of necrotic tissue within the wound bed including Eschar. Wound #4 status is Open. Original cause of wound was Pressure Injury. The wound is located on the Midline Sacrum. The wound measures 5.7cm length x 4cm width x 2.7cm depth; 17.907cm^2 area and 48.349cm^3 volume. There is muscle and Fat Layer (Subcutaneous Tissue) Exposed exposed. There is no tunneling or undermining noted. There is a large amount of purulent drainage noted. The wound margin is indistinct and nonvisible. There is medium (34-66%) red granulation within the wound bed. There is a medium (34-66%) amount of necrotic tissue within the wound bed including Eschar, Adherent Slough and Necrosis of Muscle. Assessment Active Problems ICD-10 Pressure ulcer of left heel, stage 3 Pressure ulcer of right ankle, stage 3 Pressure ulcer of left hip, stage 4 [name], [name] ([id number]) Pressure ulcer of sacral region, stage 3 Essential (primary) hypertension Vascular dementia without behavioral disturbance Plan Wound Cleansing: Wound #4 Midline Sacrum: Clean wound with Normal Saline. Cleanse wound with mild soap and water Primary Wound Dressing: Wound #4 Midline Sacrum: Other: - Dakins moistened gauze packed - Continue this until NPWT is available  Secondary Dressing: Wound #4 Midline Sacrum: ABD pad - secure with tape - Continue this until NPWT is available Dressing Change Frequency: Wound #4 Midline Sacrum: Change Dressing Monday, Wednesday, Friday Follow-up Appointments: Wound #4 Midline Sacrum: Return Appointment in 2 weeks. Off-Loading: Wound #4 Midline Sacrum: Roho cushion for wheelchair - pt needs Roho cushion for her chair Mattress - Pt needs Air mattress Turn and reposition every 2 hours - Pt is still getting pressure to her midline sacrum. Please reposition pt EVERY 2  HOURS Negative Pressure Wound Therapy: Wound #4 Midline Sacrum: Wound VAC settings at 125/130 mmHg continuous pressure. Use BLACK/GREEN foam to wound cavity. Use WHITE foam to fill any tunnel/s and/or undermining. Change VAC dressing 3 X WEEK. Change canister as indicated when full. Nurse may titrate settings and frequency of dressing changes as clinically indicated. - SNF nurses please initiate NPWT asap. Home Health Nurse may d/c VAC for s/s of increased infection, significant wound regression, or uncontrolled drainage. [facility] at [phone]. Number of foam/gauze pieces used in the dressing = - Please bridge NPWT to either side 1. Patient's wound at this time showed signs of excellent granulation in the sacral wound overall very pleased I do think it is appropriate to go ahead and see about initiation of a wound VAC at this site. 2. I am also going to suggest that we heal out the ischial wound which seems to be completely healed. 3. She does need to have continued and appropriate offloading obviously the more she can offload the better as far as that is concerned. I think they have been doing a great job at the facility currently. We will see patient back for reevaluation in 1 week here in the clinic. If anything worsens or changes patient will contact our office for additional recommendations. Electronic Signature(s) Signed: 06/24/2019 10:49:45 AM By: Lenda KelpStone III, Leeba Barbe PA-C Entered By: Lenda KelpStone III, Kayin Kettering on 06/24/2019 10:49:45 [name] [name], [name] StacksWILMA A. ([id number]) [name] [name], [name] IshiharaWILMA A. ([id number]) -------------------------------------------------------------------------------- SuperBill Details Patient Name: [name] LyeHALL, Kennedy A. Date of Service: 06/24/2019 Medical Record Number: [id number] Patient Account Number: [id number] Date of Birth/Sex: 10/08/1928 (86 y.o. F) Treating RN: Curtis Sitesorthy, Joanna Primary Care Provider: Dewaine OatsATE, DENNY Other Clinician: Referring Provider: Dewaine OatsATE, DENNY Treating  Provider/Extender: Linwood DibblesSTONE III, Neila Teem Weeks in Treatment: 16 Diagnosis Coding ICD-10 Codes Code Description [phone] Pressure ulcer of left heel, stage 3 L89.513 Pressure ulcer of right ankle, stage 3 L89.224 Pressure ulcer of left hip, stage 4 L89.153 Pressure ulcer of sacral region, stage 3 I10 Essential (primary) hypertension F01.50 Vascular dementia without behavioral disturbance Facility Procedures CPT4 Code: [id number] Description: 99214 - WOUND CARE VISIT-LEV 4 EST PT Modifier: Quantity: 1 Physician Procedures CPT4 Code: [id number] Description: 99214 - WC PHYS LEVEL 4 - EST PT ICD-10 Diagnosis Description L89.623 Pressure ulcer of left heel, stage 3 L89.513 Pressure ulcer of right ankle, stage 3 L89.224 Pressure ulcer of left hip, stage 4 L89.153 Pressure ulcer of sacral region,  stage 3 Modifier: Quantity: 1 Electronic Signature(s) Signed: 06/24/2019 10:49:58 AM By: Lenda KelpStone III, Syretta Kochel PA-C Entered By: Lenda KelpStone III, Charice Zuno on 06/24/2019 10:49:58

## 2019-06-24 NOTE — Progress Notes (Signed)
[name], [name] ([id number]) Visit Report for 06/24/2019 Arrival Information Details Patient Name: [name] [name], [name] [name]. Date of Service: 06/24/2019 9:30 AM Medical Record Number: [id number] Patient Account Number: [id number] Date of Birth/Sex: [birth year] (86 y.o. F) Treating RN: Curtis Sites Primary Care Xue Low: Dewaine Oats Other Clinician: Referring Gennie Eisinger: Dewaine Oats Treating Jerre Diguglielmo/Extender: Linwood Dibbles, HOYT Weeks in Treatment: 16 Visit Information History Since Last Visit Added or deleted any medications: No Patient Arrived: Wheel Chair Any new allergies or adverse reactions: No Arrival Time: 09:35 Had [name] fall or experienced change in No Accompanied By: self activities of daily living that may affect Transfer Assistance: Michiel Sites Lift risk of falls: Patient Identification Verified: Yes Signs or symptoms of abuse/neglect since last visito No Secondary Verification Process Completed: Yes Implantable device outside of the clinic excluding No cellular tissue based products placed in the center since last visit: Has Dressing in Place as Prescribed: Yes Pain Present Now: No Electronic Signature(s) Signed: 06/24/2019 2:17:28 PM By: Dayton Martes RCP, RRT, CHT Entered By: Dayton Martes on 06/24/2019 09:36:09 [name] [name], [name] [name] ([id number]) -------------------------------------------------------------------------------- Clinic Level of Care Assessment Details Patient Name: [name] Lye [name]. Date of Service: 06/24/2019 9:30 AM Medical Record Number: [id number] Patient Account Number: [id number] Date of Birth/Sex: [birth year] (86 y.o. F) Treating RN: Curtis Sites Primary Care Didi Ganaway: Dewaine Oats Other Clinician: Referring Sana Tessmer: TATE, Katherina Right Treating Maiko Salais/Extender: Linwood Dibbles, HOYT Weeks in Treatment: 16 Clinic Level of Care Assessment Items TOOL 4 Quantity Score []  - Use when only an EandM is performed on FOLLOW-UP visit 0 ASSESSMENTS - Nursing Assessment /  Reassessment X - Reassessment of Co-morbidities (includes updates in patient status) 1 10 X- 1 5 Reassessment of Adherence to Treatment Plan ASSESSMENTS - Wound and Skin Assessment / Reassessment X - Simple Wound Assessment / Reassessment - one wound 1 5 []  - 0 Complex Wound Assessment / Reassessment - multiple wounds []  - 0 Dermatologic / Skin Assessment (not related to wound area) ASSESSMENTS - Focused Assessment []  - Circumferential Edema Measurements - multi extremities 0 []  - 0 Nutritional Assessment / Counseling / Intervention []  - 0 Lower Extremity Assessment (monofilament, tuning fork, pulses) []  - 0 Peripheral Arterial Disease Assessment (using hand held doppler) ASSESSMENTS - Ostomy and/or Continence Assessment and Care X - Incontinence Assessment and Management 1 10 []  - 0 Ostomy Care Assessment and Management (repouching, etc.) PROCESS - Coordination of Care X - Simple Patient / Family Education for ongoing care 1 15 []  - 0 Complex (extensive) Patient / Family Education for ongoing care X- 1 10 Staff obtains , Records, Test Results / Process Orders []  - 0 Staff telephones HHA, Nursing Homes / Clarify orders / etc []  - 0 Routine Transfer to another Facility (non-emergent condition) []  - 0 Routine Hospital Admission (non-emergent condition) []  - 0 New Admissions / / Ordering NPWT, Apligraf, etc. []  - 0 Emergency Hospital Admission (emergent condition) X- 1 10 Simple Discharge Coordination [name] [name], [name] [name]. ( ) []  - 0 Complex (extensive) Discharge Coordination PROCESS - Special Needs []  - Pediatric / Minor Patient Management 0 []  - 0 Isolation Patient Management []  - 0 Hearing / Language / Visual special needs []  - 0 Assessment of Community assistance (transportation, D/C planning, etc.) []  - 0 Additional assistance / Altered mentation []  - 0 Support Surface(s) Assessment (bed, cushion, seat, etc.) INTERVENTIONS -  Wound Cleansing / Measurement []  - Simple Wound Cleansing - one wound 0 X- 2 5 Complex Wound Cleansing - multiple wounds X- 1 5  Wound Imaging (photographs - any number of wounds) []  - 0 Wound Tracing (instead of photographs) []  - 0 Simple Wound Measurement - one wound X- 2 5 Complex Wound Measurement - multiple wounds INTERVENTIONS - Wound Dressings X - Small Wound Dressing one or multiple wounds 1 10 []  - 0 Medium Wound Dressing one or multiple wounds []  - 0 Large Wound Dressing one or multiple wounds X- 1 5 Application of Medications - topical []  - 0 Application of Medications - injection INTERVENTIONS - Miscellaneous []  - External ear exam 0 []  - 0 Specimen Collection (cultures, biopsies, blood, body fluids, etc.) []  - 0 Specimen(s) / Culture(s) sent or taken to Lab for analysis X- 1 10 Patient Transfer (multiple staff / / Similar devices) []  - 0 Simple Staple / Suture removal (25 or less) []  - 0 Complex Staple / Suture removal (26 or more) []  - 0 Hypo / Hyperglycemic Management (close monitor of Blood Glucose) []  - 0 Ankle / Brachial Index (ABI) - do not check if billed separately X- 1 5 Vital Signs [name] [name], [name] [name]. ( ) Has the patient been seen at the hospital within the last three years: Yes Total Score: 120 Level Of Care: New/Established - Level 4 Electronic Signature(s) Signed: 06/24/2019 1:41:09 PM By: Entered By: on 06/24/2019 10:45:33 [place], ( ) -------------------------------------------------------------------------------- Encounter Discharge Information Details Patient Name: Nurse, [name]. Date of Service: 06/24/2019 9:30 AM Medical Record Number: Patient Account Number: Date of Birth/Sex: 05-26-1928 (86 y.o. F) Treating RN: 08/22/2019 Primary Care Carlye Panameno: Curtis Sites Other Clinician: Referring Clarene Curran: Curtis Sites Treating Apphia Cropley/Extender: 08/22/2019,  HOYT Weeks in Treatment: 16 Encounter Discharge Information Items Discharge Condition: Stable Ambulatory Status: Wheelchair Discharge Destination: Skilled Nursing Facility Telephoned: No Orders Sent: Yes Transportation: Private Auto Accompanied By: self Schedule Follow-up Appointment: Yes Clinical Summary of Care: Electronic Signature(s) Signed: 06/24/2019 1:41:09 PM By: [id number] Entered By: [name] Lye on 06/24/2019 10:46:25 [id number] [name]. ([id number]) -------------------------------------------------------------------------------- Lower Extremity Assessment Details Patient Name: 10/21/1928 [name]. Date of Service: 06/24/2019 9:30 AM Medical Record Number: Curtis Sites Patient Account Number: Dewaine Oats Date of Birth/Sex: 02/10/1929 (86 y.o. F) Treating RN: 08/22/2019 Primary Care Shmuel Girgis: Curtis Sites Other Clinician: Referring Edis Huish: Curtis Sites Treating Adelbert Gaspard/Extender: 08/22/2019, HOYT Weeks in Treatment: 16 Electronic Signature(s) Signed: 06/24/2019 12:53:43 PM By: [id number] Entered By: [name] Lye on 06/24/2019 10:15:07 [id number] [name]. ([id number]) -------------------------------------------------------------------------------- Multi Wound Chart Details Patient Name: 10/21/1928 [name]. Date of Service: 06/24/2019 9:30 AM Medical Record Number: Rodell Perna Patient Account Number: Dewaine Oats Date of Birth/Sex: [birth year] (86 y.o. F) Treating RN: 08/22/2019 Primary Care Bruk Tumolo: Rodell Perna Other Clinician: Referring Kristin Lamagna: Rodell Perna Treating Jaramie Bastos/Extender: STONE III, HOYT Weeks in Treatment: 16 Vital Signs Height(in): 67 Pulse(bpm): 84 Weight(lbs): 140 Blood Pressure(mmHg): 112/78 Body Mass Index(BMI): 22 Temperature(F): 98.1 Respiratory Rate 16 (breaths/min): Photos: [N/[name]:N/[name]] Wound Location: Left Ischium Midline Sacrum N/[name] Wounding Event: Pressure Injury Pressure Injury N/[name] Primary Etiology: Pressure Ulcer Pressure Ulcer N/[name] Comorbid History:  Glaucoma, Hypertension, Glaucoma, Hypertension, N/[name] Osteoarthritis, Dementia Osteoarthritis, Dementia Date Acquired: 07/18/2018 07/18/2018 N/[name] Weeks of Treatment: 16 16 N/[name] Wound Status: Healed - Epithelialized Open N/[name] Clustered Wound: No Yes N/[name] Clustered Quantity: N/[name] 3 N/[name] Measurements L x W x D 0x0x0 5.7x4x2.7 N/[name] (cm) Area (cm) : 0 17.907 N/[name] Volume (cm) : 0 48.349 N/[name] % Reduction in Area: 100.00% -3.60% N/[name] % Reduction in Volume: 100.00% -2698.00% N/[name] Classification: Category/Stage IV Category/Stage IV N/[name] Exudate  Amount: None Present Large N/[name] Exudate Type: N/[name] Purulent N/[name] Exudate Color: N/[name] yellow, brown, green N/[name] Wound Margin: Thickened Indistinct, nonvisible N/[name] Granulation Amount: None Present (0%) Medium (34-66%) N/[name] Granulation Quality: N/[name] Red N/[name] Necrotic Amount: Small (1-33%) Medium (34-66%) N/[name] Necrotic Tissue: Eschar Eschar, Adherent Slough N/[name] Exposed Structures: Fascia: No Fat Layer (Subcutaneous N/[name] Fat Layer (Subcutaneous Tissue) Exposed: Yes Tissue) Exposed: No Muscle: Yes Tendon: No Fascia: No [name] [name], [name] [name]. ([id number]) Muscle: No Tendon: No Joint: No Joint: No Bone: No Bone: No Epithelialization: Large (67-100%) Medium (34-66%) N/[name] Treatment Notes Electronic Signature(s) Signed: 06/24/2019 1:41:09 PM By: Montey Hora Entered By: Montey Hora on 06/24/2019 10:41:22 [name] [name] ([id number]) -------------------------------------------------------------------------------- Stewart Plan Details Patient Name: [name] Roan [name]. Date of Service: 06/24/2019 9:30 AM Medical Record Number: [id number] Patient Account Number: [id number] Date of Birth/Sex: [birth year] (86 y.o. F) Treating RN: Montey Hora Primary Care Tanajah Boulter: Benita Stabile Other Clinician: Referring Webster Patrone: Benita Stabile Treating Jesus Poplin/Extender: Melburn Hake, HOYT Weeks in Treatment: 16 Active Inactive Abuse / Safety / Falls / Self Care Management Nursing  Diagnoses: Potential for falls Goals: Patient will remain injury free related to falls Date Initiated: 03/03/2019 Target Resolution Date: 03/16/2019 Goal Status: Active Interventions: Assess fall risk on admission and as needed Notes: Medication Nursing Diagnoses: Knowledge deficit related to medication safety: actual or potential Goals: Patient/caregiver will demonstrate understanding of all current medications Date Initiated: 03/03/2019 Target Resolution Date: 03/16/2019 Goal Status: Active Interventions: Assess for medication contraindications each visit where new medications are prescribed Treatment Activities: New medication prescribed at [place] : 03/03/2019 Notes: Necrotic Tissue Nursing Diagnoses: Impaired tissue integrity related to necrotic/devitalized tissue Goals: Necrotic/devitalized tissue will be minimized in the wound bed Date Initiated: 03/03/2019 Target Resolution Date: 03/16/2019 Goal Status: Active BRUCHY, MIKEL [name]. ([id number]) Interventions: Assess patient pain level pre-, during and post procedure and prior to discharge Treatment Activities: Apply topical anesthetic as ordered : 03/03/2019 Notes: Nutrition Nursing Diagnoses: Potential for alteratiion in Nutrition/Potential for imbalanced nutrition Goals: Patient/caregiver agrees to and verbalizes understanding of need to use nutritional supplements and/or vitamins as prescribed Date Initiated: 03/03/2019 Target Resolution Date: 03/16/2019 Goal Status: Active Interventions: Provide education on nutrition Notes: Orientation to the Wound Care Program Nursing Diagnoses: Knowledge deficit related to the wound healing center program Goals: Patient/caregiver will verbalize understanding of the [place] Program Date Initiated: 03/03/2019 Target Resolution Date: 03/16/2019 Goal Status: Active Interventions: Provide education on orientation to the wound  center Notes: Pressure Nursing Diagnoses: Knowledge deficit related to management of pressures ulcers Goals: Patient/caregiver will verbalize understanding of pressure ulcer management Date Initiated: 03/03/2019 Target Resolution Date: 03/16/2019 Goal Status: Active Interventions: Assess: immobility, friction, shearing, incontinence upon admission and as needed Provide education on pressure ulcers Treatment Activities: Patient referred for home evaluation of offloading devices/mattresses : 03/03/2019 [name] [name], [name] [name]. ([id number]) Patient referred for pressure reduction/relief devices : 03/03/2019 Pressure reduction/relief device ordered : 03/03/2019 Notes: Wound/Skin Impairment Nursing Diagnoses: Impaired tissue integrity Goals: Ulcer/skin breakdown will have [name] volume reduction of 30% by week 4 Date Initiated: 03/03/2019 Target Resolution Date: 03/03/2019 Goal Status: Active Interventions: Assess ulceration(s) every visit Treatment Activities: Skin care regimen initiated : 03/03/2019 Notes: Electronic Signature(s) Signed: 06/24/2019 1:41:09 PM By: Montey Hora Entered By: Montey Hora on 06/24/2019 10:39:59 [name] Roan [name]. ([id number]) -------------------------------------------------------------------------------- Pain Assessment Details Patient Name: [name] Roan [name]. Date of Service: 06/24/2019 9:30 AM Medical Record Number: [id number] Patient Account Number: [id number] Date of Birth/Sex: 11-04-1928 (87 y.o. F) Treating RN: Montey Hora Primary  Care Liya Strollo: Dewaine Oats Other Clinician: Referring Ceclia Koker: Dewaine Oats Treating Quenesha Douglass/Extender: STONE III, HOYT Weeks in Treatment: 16 Active Problems Location of Pain Severity and Description of Pain Patient Has Paino No Site Locations Pain Management and Medication Current Pain Management: Electronic Signature(s) Signed: 06/24/2019 1:41:09 PM By: Curtis Sites Signed: 06/24/2019 2:17:28 PM By: Dayton Martes RCP, RRT, CHT Entered By: Dayton Martes on 06/24/2019 09:36:16 [name] [name], [name] [name] ([id number]) -------------------------------------------------------------------------------- Patient/Caregiver Education Details Patient Name: [name] Lye [name]. Date of Service: 06/24/2019 9:30 AM Medical Record Number: [id number] Patient Account Number: [id number] Date of Birth/Gender: Feb 04, 1929 (86 y.o. F) Treating RN: Curtis Sites Primary Care Physician: Dewaine Oats Other Clinician: Referring Physician: Dewaine Oats Treating Physician/Extender: Skeet Simmer in Treatment: 16 Education Assessment Education Provided To: Caregiver SNF nurses Education Topics Provided Wound/Skin Impairment: Handouts: Other: wound care orders Methods: Clinical cytogeneticist) Signed: 06/24/2019 1:41:09 PM By: Curtis Sites Entered By: Curtis Sites on 06/24/2019 10:45:53 [name] [name], [name] [name]. ([id number]) -------------------------------------------------------------------------------- Wound Assessment Details Patient Name: [name] Lye [name]. Date of Service: 06/24/2019 9:30 AM Medical Record Number: [id number] Patient Account Number: [id number] Date of Birth/Sex: [birth year] (86 y.o. F) Treating RN: Curtis Sites Primary Care Anetria Harwick: Dewaine Oats Other Clinician: Referring Jarom Govan: Dewaine Oats Treating Amor Packard/Extender: STONE III, HOYT Weeks in Treatment: 16 Wound Status Wound Number: 3 Primary Pressure Ulcer Etiology: Wound Location: Left Ischium Wound Status: Healed - Epithelialized Wounding Event: Pressure Injury Comorbid Glaucoma, Hypertension, Osteoarthritis, Date Acquired: 07/18/2018 History: Dementia Weeks Of Treatment: 16 Clustered Wound: No Photos Wound Measurements Length: (cm) 0 % Red Width: (cm) 0 % Red Depth: (cm) 0 Epith Area: (cm) 0 Tunn Volume: (cm) 0 Unde uction in Area: 100% uction in Volume: 100% elialization: Large (67-100%) eling: No rmining: No Wound  Description Classification: Category/Stage IV Wound Margin: Thickened Exudate Amount: None Present Foul Odor After Cleansing: No Slough/Fibrino Yes Wound Bed Granulation Amount: None Present (0%) Exposed Structure Necrotic Amount: Small (1-33%) Fascia Exposed: No Necrotic Quality: Eschar Fat Layer (Subcutaneous Tissue) Exposed: No Tendon Exposed: No Muscle Exposed: No Joint Exposed: No Bone Exposed: No Electronic Signature(s) Signed: 06/24/2019 1:41:09 PM By: Curtis Sites Entered By: Curtis Sites on 06/24/2019 10:40:48 [name] [name], [name] [name]. ([id number]) [name] [name], [name] [name]. ([id number]) -------------------------------------------------------------------------------- Wound Assessment Details Patient Name: [name] Lye [name]. Date of Service: 06/24/2019 9:30 AM Medical Record Number: [id number] Patient Account Number: [id number] Date of Birth/Sex: 22-Jul-1928 (86 y.o. F) Treating RN: Curtis Sites Primary Care Jazyah Butsch: Dewaine Oats Other Clinician: Referring Takhia Spoon: Dewaine Oats Treating Jerimiah Wolman/Extender: STONE III, HOYT Weeks in Treatment: 16 Wound Status Wound Number: 4 Primary Pressure Ulcer Etiology: Wound Location: Midline Sacrum Wound Status: Open Wounding Event: Pressure Injury Comorbid Glaucoma, Hypertension, Osteoarthritis, Date Acquired: 07/18/2018 History: Dementia Weeks Of Treatment: 16 Clustered Wound: Yes Photos Wound Measurements Length: (cm) 5.7 % Reduction Width: (cm) 4 % Reduction Depth: (cm) 2.7 Epithelializ Clustered Quantity: 3 Tunneling: Area: (cm) 17.907 Undermining Volume: (cm) 48.349 in Area: -3.6% in Volume: -2698% ation: Medium (34-66%) No : No Wound Description Classification: Category/Stage IV Foul Odor [name] Wound Margin: Indistinct, nonvisible Slough/Fibr Exudate Amount: Large Exudate Type: Purulent Exudate Color: yellow, brown, green fter Cleansing: No ino Yes Wound Bed Granulation Amount: Medium (34-66%) Exposed Structure Granulation Quality:  Red Fascia Exposed: No Necrotic Amount: Medium (34-66%) Fat Layer (Subcutaneous Tissue) Exposed: Yes Necrotic Quality: Eschar, Adherent Slough Tendon Exposed: No Muscle Exposed: Yes Necrosis of Muscle: Yes Joint Exposed: No Bone Exposed: No Dolinger, Juley [name]. ([id number]) Treatment Notes Wound #4 (Midline Sacrum) Notes Dakins gauze  with ABD and tape to midline sacrum Electronic Signature(s) Signed: 06/24/2019 1:41:09 PM By: Curtis Sites Entered By: Curtis Sites on 06/24/2019 10:40:49 [name] Lye [name]. ([id number]) -------------------------------------------------------------------------------- Vitals Details Patient Name: [name] Lye [name]. Date of Service: 06/24/2019 9:30 AM Medical Record Number: [id number] Patient Account Number: [id number] Date of Birth/Sex: Sep 22, 1928 (86 y.o. F) Treating RN: Curtis Sites Primary Care Lennex Pietila: Dewaine Oats Other Clinician: Referring Amour Trigg: Dewaine Oats Treating Zhanae Proffit/Extender: STONE III, HOYT Weeks in Treatment: 16 Vital Signs Time Taken: 19:38 Temperature (F): 98.1 Height (in): 67 Pulse (bpm): 84 Weight (lbs): 140 Respiratory Rate (breaths/min): 16 Body Mass Index (BMI): 21.9 Blood Pressure (mmHg): 112/78 Reference Range: 80 - 120 mg / dl Electronic Signature(s) Signed: 06/24/2019 2:17:28 PM By: Dayton Martes RCP, RRT, CHT Entered By: Dayton Martes on 06/24/2019 09:41:01

## 2019-07-08 ENCOUNTER — Ambulatory Visit: Payer: Medicare Other | Admitting: Physician Assistant

## 2019-07-14 ENCOUNTER — Other Ambulatory Visit
Admission: RE | Admit: 2019-07-14 | Discharge: 2019-07-14 | Disposition: A | Discharge status: Home / Self Care | Payer: Medicare Other | Source: Ambulatory Visit | Attending: Physician Assistant | Admitting: Physician Assistant

## 2019-07-14 ENCOUNTER — Encounter: Payer: Medicare Other | Admitting: Physician Assistant

## 2019-07-14 ENCOUNTER — Other Ambulatory Visit: Payer: Self-pay

## 2019-07-14 DIAGNOSIS — B999 Unspecified infectious disease: Secondary | ICD-10-CM | POA: Insufficient documentation

## 2019-07-14 DIAGNOSIS — L89153 Pressure ulcer of sacral region, stage 3: Secondary | ICD-10-CM | POA: Diagnosis not present

## 2019-07-14 NOTE — Progress Notes (Addendum)
KRISLYNN, GRONAU ([id number]) Visit Report for 07/14/2019 Chief Complaint Document Details Patient Name: [name] [name], [name] Date of Service: 07/14/2019 9:15 AM Medical Record Number: [id number] Patient Account Number: [id number] Date of Birth/Sex: [birth year] (86 y.o. F) Treating RN: Rodell Perna Primary Care Provider: Dewaine Oats Other Clinician: Referring Provider: Dewaine Oats Treating Provider/Extender: Linwood Dibbles, Consuelo Suthers Weeks in Treatment: 19 Information Obtained from: Patient Chief Complaint Multiple pressure ulcers Electronic Signature(s) Signed: 07/14/2019 9:30:32 AM By: Lenda Kelp PA-C Entered By: Lenda Kelp on 07/14/2019 09:30:32 [name] [name], [name] [name] ([id number]) -------------------------------------------------------------------------------- HPI Details Patient Name: [name] Lye A. Date of Service: 07/14/2019 9:15 AM Medical Record Number: [id number] Patient Account Number: [id number] Date of Birth/Sex: 25-Apr-1929 (86 y.o. F) Treating RN: Rodell Perna Primary Care Provider: Dewaine Oats Other Clinician: Referring Provider: Dewaine Oats Treating Provider/Extender: Linwood Dibbles, Tamarion Haymond Weeks in Treatment: 19 History of Present Illness HPI Description: 03/03/2019 on evaluation today patient presents for initial evaluation here in the clinic today concerning issues that she has been having with wounds which are pressure in nature at multiple locations currently. This is on her left heel, right ankle, left hip/ischial location, and all her to varying degrees of severity and depth. The worst is on the left ischial location. At this time the patient has been tolerating dressing changes although I am not exactly sure the dressings that have been utilized prior to coming in today. She does have some necrotic tissue noted at several locations this can require at least some degree of cleaning prior to application of dressings going forward. The patient does have dementia and unfortunately in recent months  has become increasingly immobile requiring more significant treatment she was just recently moved roughly 2 weeks ago from assisted living to a skilled nursing facility at [facility]. Based on what I am seeing currently these pressure injuries occurred prior to that 2-week time I do not see any new or obvious pressure injury at this point. I discussed with the patient as well as her family member that this does appear to be doing better in my opinion than likely where things started. They are unsure as to whether or not she has an air mattress at the facility I do think that would be something that would be appropriate and good for her to have but at the moment I do not know for sure whether that is already in place if not that definitely is can be 1 of the recommendations. Also think a Roho cushion for her wheelchair would be good as well as Prevalon offloading boots. The patient currently does not have any severe pain although when I was cleaning some of the areas she did note there was some discomfort at several of the locations. She does have a history of hypertension as well. 10/29; this is a patient from [facility] nursing home. She is nonambulatory. She is here for follow-up on 4 different wounds 1 over the left lateral malleolus, the right medial calcaneus, the left posterior hip and I believe a new area over the sacrum. The patient is nonambulatory but apparently eats well. 03/24/2019 on evaluation today patient actually appears to be doing better with regard to her wounds in general based on what I am seeing. Fortunately there is no signs of active infection at this time. No fever chills noted. Overall been very pleased with the progress that seems to be occurring since I last saw her. There is a little bit of debridement That will need to be performed  today. 04/07/2019 on evaluation today patient actually appears to be doing well in some regards based on what I am seeing today.  Fortunately there is no signs of active infection at this time which is good news. No fever chills noted. With that being said the wounds on her foot and ankle region in general have healed at this point. I do not see anything open in that region. With regard to the other 2 wounds in the left ischium and midline sacral region I believe that we may need to make a dressing change at this point to do something a little bit more conducive to cleaning up the wound bed. The patient is in agreement with that plan today. 04/21/2019 on evaluation today patient actually appears to be doing well with regard to her wounds all things considered. Both appear to be much cleaner than what they were previous. Fortunately there is no signs of active infection at this time. Both I think are doing well with the current wound care measures which is the Dakin moistened gauze dressings. 05/05/2019 on evaluation today patient seems to be doing much better in regard to her ischial ulcer unfortunately she still seems to be getting pressure to the sacral region. I think that this is evidenced by the fact that she does have some surrounding deep tissue injury and the fact that the wound is actually measuring larger not better. Fortunately there is no evidence of active infection at this time I do believe the Dakin's is still the best option treatment wise but again I do think the patient needs more appropriate offloading on the sacral region. 06/24/2019 on evaluation today patient appears to be doing well with regard to the wound over the ischium which appears to be completely healed. This is great news. Fortunately there does appear to be some improvement with regard to the sacral wound as well. There still does seem to be evidence of necrotic tissue noted minimally that I was able to gently clean away. With that being said the wound itself seems to be doing quite well which is good news. She seems to be offloaded appropriately in  my opinion at this point. I think she may be ready for a wound VAC. 07/14/2019 on evaluation today patient appears to be doing well with regard to her sacral wound in general although unfortunately she does appear to have some evidence of potential infection there is some odor that seems to be a little stronger than just what would normally be noted with the wound VAC. With that being said I do think we need to address this and though I think the wound VAC is appropriate to continue I think that we do need to see about a culture as well as placing her on some antibiotic therapy at this point. The patient's granddaughter was present during evaluation today as well. Electronic Signature(s) Signed: 07/14/2019 9:42:31 AM By: Lenda Kelp PA-C Entered By: Lenda Kelp on 07/14/2019 09:42:31 [name] [name], [name] [name] ([id number]) -------------------------------------------------------------------------------- Physical Exam Details Patient Name: [name] Lye A. Date of Service: 07/14/2019 9:15 AM Medical Record Number: [id number] Patient Account Number: [id number] Date of Birth/Sex: Jun 04, 1928 (86 y.o. F) Treating RN: Rodell Perna Primary Care Provider: Dewaine Oats Other Clinician: Referring Provider: TATE, Katherina Right Treating Provider/Extender: STONE III, Nicolae Vasek Weeks in Treatment: 19 Constitutional Well-nourished and well-hydrated in no acute distress. Respiratory normal breathing without difficulty. Psychiatric this patient is able to make decisions and demonstrates good insight into disease process. Alert and Oriented x  3. pleasant and cooperative. Notes Patient's wound bed currently showed signs of good granulation at this time. Fortunately there is no evidence of active infection which is great news systemically. The patient seems to be acting fairly normal as best I can tell during a short office visit. With that being said she does not seem to have any significant pain which is also good news there does  not appear to be any significant pressure injury though I am concerned about the possibility of infection. I did obtain a wound culture swab today. Electronic Signature(s) Signed: 07/14/2019 9:43:03 AM By: Lenda Kelp PA-C Entered By: Lenda Kelp on 07/14/2019 09:43:03 [name] [name], [name] [name] ([id number]) -------------------------------------------------------------------------------- Physician Orders Details Patient Name: [name] Lye A. Date of Service: 07/14/2019 9:15 AM Medical Record Number: [id number] Patient Account Number: [id number] Date of Birth/Sex: 08/02/1928 (86 y.o. F) Treating RN: Curtis Sites Primary Care Provider: Dewaine Oats Other Clinician: Referring Provider: Dewaine Oats Treating Provider/Extender: Linwood Dibbles, Oral Hallgren Weeks in Treatment: 54 Verbal / Phone Orders: No Diagnosis Coding ICD-10 Coding Code Description L89.623 Pressure ulcer of left heel, stage 3 L89.513 Pressure ulcer of right ankle, stage 3 L89.224 Pressure ulcer of left hip, stage 4 L89.153 Pressure ulcer of sacral region, stage 3 I10 Essential (primary) hypertension F01.50 Vascular dementia without behavioral disturbance Wound Cleansing Wound #4 Midline Sacrum o Clean wound with Normal Saline. o Cleanse wound with mild soap and water Primary Wound Dressing Wound #4 Midline Sacrum o Other: - Dakins moistened gauze packed - Continue this until NPWT is available Secondary Dressing Wound #4 Midline Sacrum o ABD pad - secure with tape - Continue this until NPWT is available Dressing Change Frequency Wound #4 Midline Sacrum o Change Dressing Monday, Wednesday, Friday Follow-up Appointments Wound #4 Midline Sacrum o Return Appointment in 2 weeks. Off-Loading Wound #4 Midline Sacrum o Roho cushion for wheelchair - pt needs Roho cushion for her chair o Mattress - Pt needs Air mattress o Turn and reposition every 2 hours - Pt is still getting pressure to her midline sacrum. Please  reposition pt EVERY 2 HOURS Negative Pressure Wound Therapy Wound #4 Midline Sacrum o Wound VAC settings at 125/130 mmHg continuous pressure. Use BLACK/GREEN foam to wound cavity. Use WHITE foam to fill any tunnel/s and/or undermining. Change VAC dressing 3 X WEEK. Change canister as indicated when full. Nurse may titrate settings and frequency of dressing changes as clinically indicated. - SNF nurses please initiate NPWT asap. o Home Health Nurse may d/c VAC for s/s of increased infection, significant wound regression, or uncontrolled drainage. Not[facility] at [phone]. o Number of foam/gauze pieces used in the dressing = - Please bridge NPWT to either side Laboratory o Bacteria identified in Wound by Culture (MICRO) - culture sacrum oooo LOINC Code: 6462-6 [name] [name], [name] ([id number]) oooo Convenience Name: Wound culture routine Patient Medications Allergies: No Known Drug Allergies Notifications Medication Indication Start End doxycycline hyclate 07/14/2019 DOSE 1 - oral 100 mg capsule - 1 capsule oral taken 2 times per day for 14 days Electronic Signature(s) Signed: 07/26/2019 10:43:26 AM By: Rodell Perna Signed: 07/26/2019 4:47:36 PM By: Lenda Kelp PA-C Previous Signature: 07/14/2019 9:40:29 AM Version By: Lenda Kelp PA-C Entered By: Rodell Perna on 07/26/2019 10:33:35 [name] Lye A. ([id number]) -------------------------------------------------------------------------------- Prescription 07/14/2019 Patient Name: [name] Lye A. Provider: Lenda Kelp PA-C Date of Birth: 07-Feb-1929 NPI#: [id number] Sex: F DEA#: [id number] Phone #: [phone] License #: Patient Address: [facility]  [place], [place] [address] [address], [place], [place] [address] [phone] Allergies No Known Drug Allergies Medication Medication: Route: Strength:  Form: doxycycline hyclate 100 mg capsule oral 100 mg capsule Class: TETRACYCLINE ANTIBIOTICS Dose: Frequency / Time: Indication: 1 1 capsule oral taken 2 times per day for 14 days Number of Refills: Number of Units: 0 Twenty Eight (28) Capsule(s) Generic Substitution: Start Date: End Date: One Time Use: Substitution Permitted 0/02/9322 No Note to Pharmacy: Signature(s): Date(s): Electronic Signature(s) Signed: 07/26/2019 10:43:26 AM By: Army Melia Signed: 07/26/2019 4:47:36 PM By: Worthy Keeler PA-C Previous Signature: 07/14/2019 5:55:32 PM Version By: Worthy Keeler PA-C Entered By: Army Melia on 07/26/2019 10:33:36 [name] [name], [name] ([id number]) --------------------------------------------------------------------------------  Problem List Details Patient Name: [name] Roan A. Date of Service: 07/14/2019 9:15 AM Medical Record Number: [id number] Patient Account Number: [id number] Date of Birth/Sex: 1929-02-22 (86 y.o. F) Treating RN: Army Melia Primary Care Provider: Benita Stabile Other Clinician: Referring Provider: Benita Stabile Treating Provider/Extender: Melburn Hake, Hebah Bogosian Weeks in Treatment: 19 Active Problems ICD-10 Evaluated Encounter Code Description Active Date Today Diagnosis L89.623 Pressure ulcer of left heel, stage 3 03/03/2019 No Yes L89.513 Pressure ulcer of right ankle, stage 3 03/03/2019 No Yes L89.224 Pressure ulcer of left hip, stage 4 03/03/2019 No Yes L89.153 Pressure ulcer of sacral region, stage 3 03/24/2019 No Yes I10 Essential (primary) hypertension 03/03/2019 No Yes F01.50 Vascular dementia without behavioral disturbance 03/03/2019 No Yes Inactive Problems Resolved Problems Electronic Signature(s) Signed: 07/14/2019 9:30:26 AM By: Worthy Keeler PA-C Entered By: Worthy Keeler on 07/14/2019 09:30:26 Melikian, [name] Capers A. ([id number]) -------------------------------------------------------------------------------- Progress Note Details Patient Name: [name] Roan A. Date of Service: 07/14/2019 9:15 AM Medical Record Number: [id number] Patient Account Number: [id number] Date of Birth/Sex: 1928/12/22 (86 y.o. F) Treating RN: Army Melia Primary Care Provider: Benita Stabile Other Clinician: Referring Provider: Benita Stabile Treating Provider/Extender: Melburn Hake, Kiyonna Tortorelli Weeks in Treatment: 19 Subjective Chief Complaint Information obtained from Patient Multiple pressure ulcers History of Present Illness (HPI) 03/03/2019 on evaluation today patient presents for initial evaluation here in the clinic today concerning issues that she has been having with wounds which are pressure in nature at multiple locations currently. This is on her left heel, right ankle, left hip/ischial location, and all her to varying degrees of severity and depth. The worst is on the left ischial location. At this time the patient has been tolerating dressing changes although I am not exactly sure the dressings that have been utilized prior to coming in today. She does have some necrotic tissue noted at several locations this can require at least some degree of cleaning prior to application of dressings going forward. The patient does have dementia and unfortunately in recent months has become increasingly immobile requiring more significant treatment she was just recently moved roughly 2 weeks ago from assisted living to a skilled nursing facility at [facility]. Based on what I am seeing currently these pressure injuries occurred prior to that 2-week time I do not see any new or obvious pressure injury at this point. I discussed with the patient as well as her family member that this does appear to be doing better in my opinion than likely where things started. They are unsure as to whether or not she has an air mattress at the facility I do think that would be something that would be appropriate and good for her to have but at the moment  I do not know for sure whether that is  already in place if not that definitely is can be 1 of the recommendations. Also think a Roho cushion for her wheelchair would be good as well as Prevalon offloading boots. The patient currently does not have any severe pain although when I was cleaning some of the areas she did note there was some discomfort at several of the locations. She does have a history of hypertension as well. 10/29; this is a patient from [facility] nursing home. She is nonambulatory. She is here for follow-up on 4 different wounds 1 over the left lateral malleolus, the right medial calcaneus, the left posterior hip and I believe a new area over the sacrum. The patient is nonambulatory but apparently eats well. 03/24/2019 on evaluation today patient actually appears to be doing better with regard to her wounds in general based on what I am seeing. Fortunately there is no signs of active infection at this time. No fever chills noted. Overall been very pleased with the progress that seems to be occurring since I last saw her. There is a little bit of debridement That will need to be performed today. 04/07/2019 on evaluation today patient actually appears to be doing well in some regards based on what I am seeing today. Fortunately there is no signs of active infection at this time which is good news. No fever chills noted. With that being said the wounds on her foot and ankle region in general have healed at this point. I do not see anything open in that region. With regard to the other 2 wounds in the left ischium and midline sacral region I believe that we may need to make a dressing change at this point to do something a little bit more conducive to cleaning up the wound bed. The patient is in agreement with that plan today. 04/21/2019 on evaluation today patient actually appears to be doing well with regard to her wounds all things considered. Both appear to be much cleaner than what they were previous. Fortunately  there is no signs of active infection at this time. Both I think are doing well with the current wound care measures which is the Dakin moistened gauze dressings. 05/05/2019 on evaluation today patient seems to be doing much better in regard to her ischial ulcer unfortunately she still seems to be getting pressure to the sacral region. I think that this is evidenced by the fact that she does have some surrounding deep tissue injury and the fact that the wound is actually measuring larger not better. Fortunately there is no evidence of active infection at this time I do believe the Dakin's is still the best option treatment wise but again I do think the patient needs more appropriate offloading on the sacral region. 06/24/2019 on evaluation today patient appears to be doing well with regard to the wound over the ischium which appears to be completely healed. This is great news. Fortunately there does appear to be some improvement with regard to the sacral wound as well. There still does seem to be evidence of necrotic tissue noted minimally that I was able to gently clean away. With that being said the wound itself seems to be doing quite well which is good news. She seems to be offloaded appropriately in my opinion at this point. I think she may be ready for a wound VAC. 07/14/2019 on evaluation today patient appears to be doing well with regard to her sacral wound  in general although unfortunately she does appear to have some evidence of potential infection there is some odor that seems to be a little stronger than just what would normally be noted with the wound VAC. With that being said I do think we need to address this and though I think the wound VAC is appropriate to continue I think that we do need to see about a culture as well as placing her on some antibiotic therapy at this point. The patient's granddaughter was present during evaluation today as well. [name] [name], Azhane A.  ([id number]) Objective Constitutional Well-nourished and well-hydrated in no acute distress. Vitals Time Taken: 8:53 AM, Height: 67 in, Weight: 140 lbs, BMI: 21.9, Temperature: 98.3 F, Pulse: 86 bpm, Respiratory Rate: 16 breaths/min, Blood Pressure: 113/67 mmHg. Respiratory normal breathing without difficulty. Psychiatric this patient is able to make decisions and demonstrates good insight into disease process. Alert and Oriented x 3. pleasant and cooperative. General Notes: Patient's wound bed currently showed signs of good granulation at this time. Fortunately there is no evidence of active infection which is great news systemically. The patient seems to be acting fairly normal as best I can tell during a short office visit. With that being said she does not seem to have any significant pain which is also good news there does not appear to be any significant pressure injury though I am concerned about the possibility of infection. I did obtain a wound culture swab today. Integumentary (Hair, Skin) Wound #4 status is Open. Original cause of wound was Pressure Injury. The wound is located on the Midline Sacrum. The wound measures 4.9cm length x 3.3cm width x 2cm depth; 12.7cm^2 area and 25.4cm^3 volume. There is muscle and Fat Layer (Subcutaneous Tissue) Exposed exposed. There is no tunneling or undermining noted. There is a large amount of purulent drainage noted. The wound margin is indistinct and nonvisible. There is medium (34-66%) red granulation within the wound bed. There is a medium (34-66%) amount of necrotic tissue within the wound bed including Eschar, Adherent Slough and Necrosis of Muscle. Assessment Active Problems ICD-10 Pressure ulcer of left heel, stage 3 Pressure ulcer of right ankle, stage 3 Pressure ulcer of left hip, stage 4 Pressure ulcer of sacral region, stage 3 Essential (primary) hypertension Vascular dementia without behavioral disturbance Plan Wound  Cleansing: Wound #4 Midline Sacrum: Clean wound with Normal Saline. Cleanse wound with mild soap and water Primary Wound Dressing: Wound #4 Midline Sacrum: Other: - Dakins moistened gauze packed - Continue this until NPWT is available Secondary Dressing: Wound #4 Midline Sacrum: ABD pad - secure with tape - Continue this until NPWT is available Dressing Change Frequency: Wound #4 Midline Sacrum: Change Dressing Monday, Wednesday, Friday Follow-up Appointments: Wound #4 Midline Sacrum: Return Appointment in 2 weeks. [name] [name], [name] ([id number]) Off-Loading: Wound #4 Midline Sacrum: Roho cushion for wheelchair - pt needs Roho cushion for her chair Mattress - Pt needs Air mattress Turn and reposition every 2 hours - Pt is still getting pressure to her midline sacrum. Please reposition pt EVERY 2 HOURS Negative Pressure Wound Therapy: Wound #4 Midline Sacrum: Wound VAC settings at 125/130 mmHg continuous pressure. Use BLACK/GREEN foam to wound cavity. Use WHITE foam to fill any tunnel/s and/or undermining. Change VAC dressing 3 X WEEK. Change canister as indicated when full. Nurse may titrate settings and frequency of dressing changes as clinically indicated. - SNF nurses please initiate NPWT asap. Home Health Nurse may d/c VAC for s/s of increased infection, significant wound  regression, or uncontrolled drainage. Not[facility] at [phone]. Number of foam/gauze pieces used in the dressing = - Please bridge NPWT to either side The following medication(s) was prescribed: doxycycline hyclate oral 100 mg capsule 1 1 capsule oral taken 2 times per day for 14 days starting 07/14/2019 1. I would recommend currently that we go ahead and initiate treatment with doxycycline for the time being I did send this to the facility to go ahead and get started. 2. I am also going to suggest at this point that we also continue with the wound VAC I think that still appropriate. I am also  going to recommend that the patient continue with appropriate offloading including air mattress, Roho cushion for the wheelchair, and turning/repositioning every 2 hours. 3. I am also can recommend that the patient depending on the results of the culture may have to switch antibiotics we will see what the culture shows and make any adjustments as necessary. I do plan to see her back in 2 weeks that is the length of the prescription I sent for doxycycline as well. We will see patient back for reevaluation in 1 week here in the clinic. If anything worsens or changes patient will contact our office for additional recommendations. Electronic Signature(s) Signed: 07/14/2019 9:43:59 AM By: Lenda KelpStone III, Darleene Cumpian PA-C Entered By: Lenda KelpStone III, Bani Gianfrancesco on 07/14/2019 09:43:59 [name] [name], [name] IshiharaWILMA A. ([id number]) -------------------------------------------------------------------------------- SuperBill Details Patient Name: [name] [name], [name] Date of Service: 07/14/2019 Medical Record Number: [id number] Patient Account Number: [id number] Date of Birth/Sex: 07/27/1928 (86 y.o. F) Treating RN: Rodell PernaScott, Dajea Primary Care Provider: Dewaine OatsATE, DENNY Other Clinician: Referring Provider: Dewaine OatsATE, DENNY Treating Provider/Extender: Linwood DibblesSTONE III, Korah Hufstedler Weeks in Treatment: 19 Diagnosis Coding ICD-10 Codes Code Description [phone] Pressure ulcer of left heel, stage 3 L89.513 Pressure ulcer of right ankle, stage 3 L89.224 Pressure ulcer of left hip, stage 4 L89.153 Pressure ulcer of sacral region, stage 3 I10 Essential (primary) hypertension F01.50 Vascular dementia without behavioral disturbance Facility Procedures CPT4 Code: [id number] Description: 99213 - WOUND CARE VISIT-LEV 3 EST PT Modifier: Quantity: 1 Physician Procedures CPT4 Code: [id number] Description: 99214 - WC PHYS LEVEL 4 - EST PT Modifier: Quantity: 1 CPT4 Code: Description: ICD-10 Diagnosis Description L89.623 Pressure ulcer of left heel, stage 3 L89.513 Pressure ulcer of right  ankle, stage 3 L89.224 Pressure ulcer of left hip, stage 4 L89.153 Pressure ulcer of sacral region, stage 3 Modifier: Quantity: Electronic Signature(s) Signed: 07/14/2019 9:52:35 AM By: Lenda KelpStone III, Skyelar Swigart PA-C Entered By: Lenda KelpStone III, Leana Springston on 07/14/2019 09:52:34

## 2019-07-14 NOTE — Progress Notes (Addendum)
BANESSA, MAO ([id number]) Visit Report for 07/14/2019 Arrival Information Details Patient Name: [name] [name], [name] Date of Service: 07/14/2019 9:15 AM Medical Record Number: [id number] Patient Account Number: [id number] Date of Birth/Sex: Sep 20, 1928 (86 y.o. F) Treating RN: Rodell Perna Primary Care Kirtis Challis: Dewaine Oats Other Clinician: Referring Jaquanda Wickersham: Dewaine Oats Treating Eman Rynders/Extender: Linwood Dibbles, HOYT Weeks in Treatment: 19 Visit Information History Since Last Visit Added or deleted any medications: No Patient Arrived: Ambulatory Any new allergies or adverse reactions: No Arrival Time: 08:53 Had a fall or experienced change in No Accompanied By: self activities of daily living that may affect Transfer Assistance: Michiel Sites Lift risk of falls: Patient Identification Verified: Yes Signs or symptoms of abuse/neglect since last visito No Secondary Verification Process Completed: Yes Hospitalized since last visit: No Implantable device outside of the clinic excluding No cellular tissue based products placed in the center since last visit: Has Dressing in Place as Prescribed: Yes Pain Present Now: No Electronic Signature(s) Signed: 07/14/2019 9:20:27 AM By: Dayton Martes RCP, RRT, CHT Entered By: Dayton Martes on 07/14/2019 08:53:56 Natarajan, [name] ([id number]) -------------------------------------------------------------------------------- Clinic Level of Care Assessment Details Patient Name: [name] [name] A. Date of Service: 07/14/2019 9:15 AM Medical Record Number: [id number] Patient Account Number: [id number] Date of Birth/Sex: 10-29-1928 (86 y.o. F) Treating RN: Curtis Sites Primary Care Corbyn Steedman: Dewaine Oats Other Clinician: Referring Mariachristina Holle: TATE, Katherina Right Treating Di Jasmer/Extender: Linwood Dibbles, HOYT Weeks in Treatment: 19 Clinic Level of Care Assessment Items TOOL 4 Quantity Score []  - Use when only an EandM is performed on FOLLOW-UP visit  0 ASSESSMENTS - Nursing Assessment / Reassessment X - Reassessment of Co-morbidities (includes updates in patient status) 1 10 X- 1 5 Reassessment of Adherence to Treatment Plan ASSESSMENTS - Wound and Skin Assessment / Reassessment X - Simple Wound Assessment / Reassessment - one wound 1 5 []  - 0 Complex Wound Assessment / Reassessment - multiple wounds []  - 0 Dermatologic / Skin Assessment (not related to wound area) ASSESSMENTS - Focused Assessment []  - Circumferential Edema Measurements - multi extremities 0 []  - 0 Nutritional Assessment / Counseling / Intervention []  - 0 Lower Extremity Assessment (monofilament, tuning fork, pulses) []  - 0 Peripheral Arterial Disease Assessment (using hand held doppler) ASSESSMENTS - Ostomy and/or Continence Assessment and Care []  - Incontinence Assessment and Management 0 []  - 0 Ostomy Care Assessment and Management (repouching, etc.) PROCESS - Coordination of Care X - Simple Patient / Family Education for ongoing care 1 15 []  - 0 Complex (extensive) Patient / Family Education for ongoing care X- 1 10 Staff obtains , Records, Test Results / Process Orders X- 1 10 Staff telephones HHA, Nursing Homes / Clarify orders / etc []  - 0 Routine Transfer to another Facility (non-emergent condition) []  - 0 Routine Hospital Admission (non-emergent condition) []  - 0 New Admissions / / Ordering NPWT, Apligraf, etc. []  - 0 Emergency Hospital Admission (emergent condition) X- 1 10 Simple Discharge Coordination []  - 0 Complex (extensive) Discharge Coordination PROCESS - Special Needs []  - Pediatric / Minor Patient Management 0 []  - 0 Isolation Patient Management []  - 0 Hearing / Language / Visual special needs []  - 0 Assessment of Community assistance (transportation, D/C planning, etc.) Otte, Shantinique A. ( ) []  - 0 Additional assistance / Altered mentation []  - 0 Support Surface(s) Assessment (bed,  cushion, seat, etc.) INTERVENTIONS - Wound Cleansing / Measurement X - Simple Wound Cleansing - one wound 1 5 []  - 0 Complex Wound Cleansing - multiple  wounds X- 1 5 Wound Imaging (photographs - any number of wounds) []  - 0 Wound Tracing (instead of photographs) X- 1 5 Simple Wound Measurement - one wound []  - 0 Complex Wound Measurement - multiple wounds INTERVENTIONS - Wound Dressings []  - Small Wound Dressing one or multiple wounds 0 X- 1 15 Medium Wound Dressing one or multiple wounds []  - 0 Large Wound Dressing one or multiple wounds []  - 0 Application of Medications - topical []  - 0 Application of Medications - injection INTERVENTIONS - Miscellaneous []  - External ear exam 0 []  - 0 Specimen Collection (cultures, biopsies, blood, body fluids, etc.) []  - 0 Specimen(s) / Culture(s) sent or taken to Lab for analysis []  - 0 Patient Transfer (multiple staff / Civil Service fast streamer / Similar devices) []  - 0 Simple Staple / Suture removal (25 or less) []  - 0 Complex Staple / Suture removal (26 or more) []  - 0 Hypo / Hyperglycemic Management (close monitor of Blood Glucose) []  - 0 Ankle / Brachial Index (ABI) - do not check if billed separately X- 1 5 Vital Signs Has the patient been seen at the hospital within the last three years: Yes Total Score: 100 Level Of Care: New/Established - Level 3 Electronic Signature(s) Signed: 07/14/2019 5:02:05 PM By: Montey Hora Entered By: Montey Hora on 07/14/2019 09:37:16 Rijo, Aundra Dubin ([id number]) -------------------------------------------------------------------------------- Encounter Discharge Information Details Patient Name: [name] [name] A. Date of Service: 07/14/2019 9:15 AM Medical Record Number: [id number] Patient Account Number: [id number] Date of Birth/Sex: Dec 21, 1928 (86 y.o. F) Treating RN: Montey Hora Primary Care Eldean Klatt: Benita Stabile Other Clinician: Referring Seleni Meller: Benita Stabile Treating Alzada Brazee/Extender: Melburn Hake, HOYT Weeks in Treatment: 19 Encounter Discharge Information Items Discharge Condition: Stable Ambulatory Status: Wheelchair Discharge Destination: Skilled Nursing Facility Telephoned: No Orders Sent: Yes Transportation: Private Auto Accompanied By: family Schedule Follow-up Appointment: Yes Clinical Summary of Care: Electronic Signature(s) Signed: 07/14/2019 5:02:05 PM By: Montey Hora Entered By: Montey Hora on 07/14/2019 09:38:00 [name] [name] A. ([id number]) -------------------------------------------------------------------------------- Lower Extremity Assessment Details Patient Name: [name] [name] A. Date of Service: 07/14/2019 9:15 AM Medical Record Number: [id number] Patient Account Number: [id number] Date of Birth/Sex: January 22, 1929 (86 y.o. F) Treating RN: Montey Hora Primary Care Ajooni Karam: Benita Stabile Other Clinician: Referring [place]: Benita Stabile Treating Zhara Gieske/Extender: Melburn Hake, HOYT Weeks in Treatment: 19 Electronic Signature(s) Signed: 07/14/2019 5:02:05 PM By: Montey Hora Entered By: Montey Hora on 07/14/2019 09:26:29 [name] [name] A. ([id number]) -------------------------------------------------------------------------------- Multi Wound Chart Details Patient Name: [name] [name] A. Date of Service: 07/14/2019 9:15 AM Medical Record Number: [id number] Patient Account Number: [id number] Date of Birth/Sex: 1929-03-06 (86 y.o. F) Treating RN: Montey Hora Primary Care Chenell Lozon: Benita Stabile Other Clinician: Referring Meziah Blasingame: TATE, Sharlet Salina Treating Kaliyan Osbourn/Extender: STONE III, HOYT Weeks in Treatment: 19 Vital Signs Height(in): 67 Pulse(bpm): 24 Weight(lbs): 140 Blood Pressure(mmHg): 113/67 Body Mass Index(BMI): 22 Temperature(F): 98.3 Respiratory Rate(breaths/min): 16 Photos: [N/A:N/A] Wound Location: Sacrum - Midline N/A N/A Wounding Event: Pressure Injury N/A N/A Primary Etiology: Pressure Ulcer N/A N/A Comorbid History: Glaucoma,  Hypertension, N/A N/A Osteoarthritis, Dementia Date Acquired: 07/18/2018 N/A N/A Weeks of Treatment: 19 N/A N/A Wound Status: Open N/A N/A Clustered Wound: Yes N/A N/A Clustered Quantity: 3 N/A N/A Measurements L x W x D (cm) 4.9x3.3x2 N/A N/A Area (cm) : 12.7 N/A N/A Volume (cm) : 25.4 N/A N/A % Reduction in Area: 26.50% N/A N/A % Reduction in Volume: -1369.90% N/A N/A Classification: Category/Stage IV N/A N/A Exudate Amount: Large N/A N/A Exudate Type: Purulent N/A N/A Exudate  Color: yellow, brown, green N/A N/A Wound Margin: Indistinct, nonvisible N/A N/A Granulation Amount: Medium (34-66%) N/A N/A Granulation Quality: Red N/A N/A Necrotic Amount: Medium (34-66%) N/A N/A Necrotic Tissue: Eschar, Adherent Slough N/A N/A Exposed Structures: Fat Layer (Subcutaneous Tissue) N/A N/A Exposed: Yes Muscle: Yes Fascia: No Tendon: No Joint: No Bone: No Epithelialization: Medium (34-66%) N/A N/A Treatment Notes Electronic Signature(s) STEPHENI, CAMERON A. ([id number]) Signed: 07/14/2019 5:02:05 PM By: Curtis Sites Entered By: Curtis Sites on 07/14/2019 09:36:16 Wiederhold, [name] ([id number]) -------------------------------------------------------------------------------- Multi-Disciplinary Care Plan Details Patient Name: [name] [name] A. Date of Service: 07/14/2019 9:15 AM Medical Record Number: [id number] Patient Account Number: [id number] Date of Birth/Sex: May 17, 1929 (86 y.o. F) Treating RN: Curtis Sites Primary Care Julina Altmann: Dewaine Oats Other Clinician: Referring Delanee Xin: Dewaine Oats Treating Gregroy Dombkowski/Extender: Linwood Dibbles, HOYT Weeks in Treatment: 19 Active Inactive Abuse / Safety / Falls / Self Care Management Nursing Diagnoses: Potential for falls Goals: Patient will remain injury free related to falls Date Initiated: 03/03/2019 Target Resolution Date: 03/16/2019 Goal Status: Active Interventions: Assess fall risk on admission and as needed Notes: Medication Nursing  Diagnoses: Knowledge deficit related to medication safety: actual or potential Goals: Patient/caregiver will demonstrate understanding of all current medications Date Initiated: 03/03/2019 Target Resolution Date: 03/16/2019 Goal Status: Active Interventions: Assess for medication contraindications each visit where new medications are prescribed Treatment Activities: New medication prescribed at Wound Center : 03/03/2019 Notes: Necrotic Tissue Nursing Diagnoses: Impaired tissue integrity related to necrotic/devitalized tissue Goals: Necrotic/devitalized tissue will be minimized in the wound bed Date Initiated: 03/03/2019 Target Resolution Date: 03/16/2019 Goal Status: Active Interventions: Assess patient pain level pre-, during and post procedure and prior to discharge Treatment Activities: Apply topical anesthetic as ordered : 03/03/2019 Notes: SHANDORA, KOOGLER ([id number]) Nutrition Nursing Diagnoses: Potential for alteratiion in Nutrition/Potential for imbalanced nutrition Goals: Patient/caregiver agrees to and verbalizes understanding of need to use nutritional supplements and/or vitamins as prescribed Date Initiated: 03/03/2019 Target Resolution Date: 03/16/2019 Goal Status: Active Interventions: Provide education on nutrition Notes: Pressure Nursing Diagnoses: Knowledge deficit related to management of pressures ulcers Goals: Patient/caregiver will verbalize understanding of pressure ulcer management Date Initiated: 03/03/2019 Target Resolution Date: 03/16/2019 Goal Status: Active Interventions: Assess: immobility, friction, shearing, incontinence upon admission and as needed Provide education on pressure ulcers Treatment Activities: Patient referred for home evaluation of offloading devices/mattresses : 03/03/2019 Patient referred for pressure reduction/relief devices : 03/03/2019 Pressure reduction/relief device ordered : 03/03/2019 Notes: Wound/Skin  Impairment Nursing Diagnoses: Impaired tissue integrity Goals: Ulcer/skin breakdown will have a volume reduction of 30% by week 4 Date Initiated: 03/03/2019 Target Resolution Date: 03/03/2019 Goal Status: Active Interventions: Assess ulceration(s) every visit Treatment Activities: Skin care regimen initiated : 03/03/2019 Notes: Electronic Signature(s) Signed: 07/14/2019 5:02:05 PM By: Curtis Sites Entered By: Curtis Sites on 07/14/2019 09:32:30 Seckel, [name] ([id number]) -------------------------------------------------------------------------------- Pain Assessment Details Patient Name: [name] [name] A. Date of Service: 07/14/2019 9:15 AM Medical Record Number: [id number] Patient Account Number: [id number] Date of Birth/Sex: 05-31-1928 (86 y.o. F) Treating RN: Curtis Sites Primary Care Annastacia Duba: Dewaine Oats Other Clinician: Referring Bertil Brickey: Dewaine Oats Treating Isa Hitz/Extender: STONE III, HOYT Weeks in Treatment: 19 Active Problems Location of Pain Severity and Description of Pain Patient Has Paino No Site Locations Pain Management and Medication Current Pain Management: Electronic Signature(s) Signed: 07/14/2019 5:02:05 PM By: Curtis Sites Entered By: Curtis Sites on 07/14/2019 09:26:20 [name] ([id number]) -------------------------------------------------------------------------------- Patient/Caregiver Education Details Patient Name: [name] [name] A. Date of Service: 07/14/2019 9:15 AM Medical Record Number: [id number] Patient  Account Number: [id number] Date of Birth/Gender: 02/27/1929 (86 y.o. F) Treating RN: Curtis Sites Primary Care Physician: Dewaine Oats Other Clinician: Referring Physician: Dewaine Oats Treating Physician/Extender: Skeet Simmer in Treatment: 42 Education Assessment Education Provided To: Patient Education Topics Provided Wound/Skin Impairment: Handouts: Caring for Your Ulcer Methods: Demonstration,  Explain/Verbal Responses: State content correctly Electronic Signature(s) Signed: 07/14/2019 5:02:05 PM By: Curtis Sites Entered By: Curtis Sites on 07/14/2019 09:37:31 Lindbloom, Palmina A. ([id number]) -------------------------------------------------------------------------------- Wound Assessment Details Patient Name: [name] [name] A. Date of Service: 07/14/2019 9:15 AM Medical Record Number: [id number] Patient Account Number: [id number] Date of Birth/Sex: [birth year] (86 y.o. F) Treating RN: Curtis Sites Primary Care Calen Posch: Dewaine Oats Other Clinician: Referring Malayna Noori: Dewaine Oats Treating Shanard Treto/Extender: STONE III, HOYT Weeks in Treatment: 19 Wound Status Wound Number: 4 Primary Etiology: Pressure Ulcer Wound Location: Sacrum - Midline Wound Status: Open Wounding Event: Pressure Injury Comorbid History: Glaucoma, Hypertension, Osteoarthritis, Dementia Date Acquired: 07/18/2018 Weeks Of Treatment: 19 Clustered Wound: Yes Photos Wound Measurements Length: (cm) 4.9 Width: (cm) 3.3 Depth: (cm) 2 Clustered Quantity: 3 Area: (cm) 12.7 Volume: (cm) 25.4 % Reduction in Area: 26.5% % Reduction in Volume: -1369.9% Epithelialization: Medium (34-66%) Tunneling: No Undermining: No Wound Description Classification: Category/Stage IV Wound Margin: Indistinct, nonvisible Exudate Amount: Large Exudate Type: Purulent Exudate Color: yellow, brown, green Foul Odor After Cleansing: No Slough/Fibrino Yes Wound Bed Granulation Amount: Medium (34-66%) Exposed Structure Granulation Quality: Red Fascia Exposed: No Necrotic Amount: Medium (34-66%) Fat Layer (Subcutaneous Tissue) Exposed: Yes Necrotic Quality: Eschar, Adherent Slough Tendon Exposed: No Muscle Exposed: Yes Necrosis of Muscle: Yes Joint Exposed: No Bone Exposed: No Treatment Notes Wound #4 (Midline Sacrum) Notes Dakins gauze with ABD and tape to midline sacrum Tursi, Giah A. ([id number]) Electronic  Signature(s) Signed: 07/14/2019 5:02:05 PM By: Curtis Sites Entered By: Curtis Sites on 07/14/2019 09:26:02 [name] [name] A. ([id number]) -------------------------------------------------------------------------------- Vitals Details Patient Name: [name] [name] A. Date of Service: 07/14/2019 9:15 AM Medical Record Number: [id number] Patient Account Number: [id number] Date of Birth/Sex: [birth year] (86 y.o. F) Treating RN: Rodell Perna Primary Care Robinson Brinkley: Dewaine Oats Other Clinician: Referring Javionna Leder: Dewaine Oats Treating Lindsey Demonte/Extender: STONE III, HOYT Weeks in Treatment: 19 Vital Signs Time Taken: 08:53 Temperature (F): 98.3 Height (in): 67 Pulse (bpm): 86 Weight (lbs): 140 Respiratory Rate (breaths/min): 16 Body Mass Index (BMI): 21.9 Blood Pressure (mmHg): 113/67 Reference Range: 80 - 120 mg / dl Electronic Signature(s) Signed: 07/14/2019 9:20:27 AM By: Dayton Martes RCP, RRT, CHT Entered By: Dayton Martes on 07/14/2019 08:54:31

## 2019-07-16 LAB — AEROBIC CULTURE W GRAM STAIN (SUPERFICIAL SPECIMEN)

## 2019-07-16 LAB — AEROBIC CULTURE? (SUPERFICIAL SPECIMEN)

## 2019-07-28 ENCOUNTER — Encounter: Payer: Medicare Other | Attending: Physician Assistant | Admitting: Physician Assistant

## 2019-07-28 ENCOUNTER — Other Ambulatory Visit: Payer: Self-pay

## 2019-07-28 DIAGNOSIS — I10 Essential (primary) hypertension: Secondary | ICD-10-CM | POA: Diagnosis not present

## 2019-07-28 DIAGNOSIS — L89224 Pressure ulcer of left hip, stage 4: Secondary | ICD-10-CM | POA: Insufficient documentation

## 2019-07-28 DIAGNOSIS — F0150 Vascular dementia without behavioral disturbance: Secondary | ICD-10-CM | POA: Diagnosis not present

## 2019-07-28 DIAGNOSIS — L89623 Pressure ulcer of left heel, stage 3: Secondary | ICD-10-CM | POA: Insufficient documentation

## 2019-07-28 DIAGNOSIS — L89153 Pressure ulcer of sacral region, stage 3: Secondary | ICD-10-CM | POA: Insufficient documentation

## 2019-07-28 DIAGNOSIS — L89513 Pressure ulcer of right ankle, stage 3: Secondary | ICD-10-CM | POA: Insufficient documentation

## 2019-07-28 DIAGNOSIS — B965 Pseudomonas (aeruginosa) (mallei) (pseudomallei) as the cause of diseases classified elsewhere: Secondary | ICD-10-CM | POA: Diagnosis not present

## 2019-07-28 DIAGNOSIS — M1990 Unspecified osteoarthritis, unspecified site: Secondary | ICD-10-CM | POA: Insufficient documentation

## 2019-07-28 DIAGNOSIS — L8990 Pressure ulcer of unspecified site, unspecified stage: Secondary | ICD-10-CM | POA: Diagnosis present

## 2019-07-28 NOTE — Progress Notes (Signed)
EIMY, PLAZA ([id number]) Visit Report for 07/28/2019 Arrival Information Details Patient Name: [name] [name], [name] Date of Service: 07/28/2019 9:15 AM Medical Record Number: [id number] Patient Account Number: [id number] Date of Birth/Sex: [birth year] (86 y.o. F) Treating RN: Army Melia Primary Care Desma Wilkowski: Benita Stabile Other Clinician: Referring Hobart Marte: Benita Stabile Treating Emeree Mahler/Extender: Melburn Hake, HOYT Weeks in Treatment: 21 Visit Information History Since Last Visit Added or deleted any medications: No Patient Arrived: Wheel Chair Any new allergies or adverse reactions: No Arrival Time: 09:18 Had a fall or experienced change in No Accompanied By: granddaughter and activities of daily living that may affect grandson risk of falls: Transfer Assistance: [employer] fast streamer Signs or symptoms of abuse/neglect since last visito No Patient Identification Verified: Yes Hospitalized since last visit: No Secondary Verification Process Yes Implantable device outside of the clinic excluding No Completed: cellular tissue based products placed in the center since last visit: Has Dressing in Place as Prescribed: Yes Pain Present Now: No Electronic Signature(s) Signed: 07/28/2019 3:53:04 PM By: Lorine Bears RCP, RRT, CHT Entered By: Lorine Bears on 07/28/2019 09:26:36 Mcglade, Aundra Dubin ([id number]) -------------------------------------------------------------------------------- Clinic Level of Care Assessment Details Patient Name: [name] Roan A. Date of Service: 07/28/2019 9:15 AM Medical Record Number: [id number] Patient Account Number: [id number] Date of Birth/Sex: 1929/02/13 (86 y.o. F) Treating RN: Army Melia Primary Care Deena Shaub: Benita Stabile Other Clinician: Referring Mykenna Viele: Benita Stabile Treating Mikyle Sox/Extender: Melburn Hake, HOYT Weeks in Treatment: 21 Clinic Level of Care Assessment Items TOOL 4 Quantity Score []  - Use when only an EandM is performed on  FOLLOW-UP visit 0 ASSESSMENTS - Nursing Assessment / Reassessment X - Reassessment of Co-morbidities (includes updates in patient status) 1 10 X- 1 5 Reassessment of Adherence to Treatment Plan ASSESSMENTS - Wound and Skin Assessment / Reassessment X - Simple Wound Assessment / Reassessment - one wound 1 5 []  - 0 Complex Wound Assessment / Reassessment - multiple wounds []  - 0 Dermatologic / Skin Assessment (not related to wound area) ASSESSMENTS - Focused Assessment []  - Circumferential Edema Measurements - multi extremities 0 []  - 0 Nutritional Assessment / Counseling / Intervention []  - 0 Lower Extremity Assessment (monofilament, tuning fork, pulses) []  - 0 Peripheral Arterial Disease Assessment (using hand held doppler) ASSESSMENTS - Ostomy and/or Continence Assessment and Care []  - Incontinence Assessment and Management 0 []  - 0 Ostomy Care Assessment and Management (repouching, etc.) PROCESS - Coordination of Care X - Simple Patient / Family Education for ongoing care 1 15 []  - 0 Complex (extensive) Patient / Family Education for ongoing care []  - 0 Staff obtains Programmer, systems, Records, Test Results / Process Orders []  - 0 Staff telephones HHA, Nursing Homes / Clarify orders / etc []  - 0 Routine Transfer to another Facility (non-emergent condition) []  - 0 Routine Hospital Admission (non-emergent condition) []  - 0 New Admissions / Biomedical engineer / Ordering NPWT, Apligraf, etc. []  - 0 Emergency Hospital Admission (emergent condition) X- 1 10 Simple Discharge Coordination []  - 0 Complex (extensive) Discharge Coordination PROCESS - Special Needs []  - Pediatric / Minor Patient Management 0 []  - 0 Isolation Patient Management []  - 0 Hearing / Language / Visual special needs []  - 0 Assessment of Community assistance (transportation, D/C planning, etc.) [name], [name] ([id number]) []  - 0 Additional assistance / Altered mentation []  - 0 Support Surface(s)  Assessment (bed, cushion, seat, etc.) INTERVENTIONS - Wound Cleansing / Measurement X - Simple Wound Cleansing - one wound 1 5 []  - 0 Complex Wound  Cleansing - multiple wounds X- 1 5 Wound Imaging (photographs - any number of wounds) []  - 0 Wound Tracing (instead of photographs) X- 1 5 Simple Wound Measurement - one wound []  - 0 Complex Wound Measurement - multiple wounds INTERVENTIONS - Wound Dressings []  - Small Wound Dressing one or multiple wounds 0 X- 1 15 Medium Wound Dressing one or multiple wounds []  - 0 Large Wound Dressing one or multiple wounds []  - 0 Application of Medications - topical []  - 0 Application of Medications - injection INTERVENTIONS - Miscellaneous []  - External ear exam 0 []  - 0 Specimen Collection (cultures, biopsies, blood, body fluids, etc.) []  - 0 Specimen(s) / Culture(s) sent or taken to Lab for analysis []  - 0 Patient Transfer (multiple staff / / Similar devices) []  - 0 Simple Staple / Suture removal (25 or less) []  - 0 Complex Staple / Suture removal (26 or more) []  - 0 Hypo / Hyperglycemic Management (close monitor of Blood Glucose) []  - 0 Ankle / Brachial Index (ABI) - do not check if billed separately X- 1 5 Vital Signs Has the patient been seen at the hospital within the last three years: Yes Total Score: 80 Level Of Care: New/Established - Level 3 Electronic Signature(s) Signed: 07/28/2019 3:43:50 PM By: Entered By: on 07/28/2019 10:01:31 ( ) -------------------------------------------------------------------------------- Encounter Discharge Information Details Patient Name: A. Date of Service: 07/28/2019 9:15 AM Medical Record Number: Patient Account Number: [name] [name] Date of Birth/Sex: [birth year] (86 y.o. F) Treating RN: Primary Care Willman Cuny: Other Clinician: Referring Mayo Owczarzak: 09/27/2019 Treating Zacari Stiff/Extender:  Rodell Perna, HOYT Weeks in Treatment: 21 Encounter Discharge Information Items Discharge Condition: Stable Ambulatory Status: Wheelchair Discharge Destination: Skilled Nursing Facility Telephoned: Yes Spoke With: triage nurse Orders Sent: Yes Transportation: Private Auto Accompanied By: family Schedule Follow-up Appointment: Yes Clinical Summary of Care: Electronic Signature(s) Signed: 07/28/2019 3:43:50 PM By: 09/27/2019 Entered By: [name] on 07/28/2019 10:02:17 [name] (09/27/2019) -------------------------------------------------------------------------------- Lower Extremity Assessment Details Patient Name: [id number] A. Date of Service: 07/28/2019 9:15 AM Medical Record Number: 10/21/1928 Patient Account Number: [id number] Date of Birth/Sex: 02/24/1929 (86 y.o. F) Treating RN: Dewaine Oats Primary Care Danyetta Gillham: Linwood Dibbles Other Clinician: Referring Maylen Waltermire: 09/27/2019 Treating Toriann Spadoni/Extender: Rodell Perna, HOYT Weeks in Treatment: 21 Electronic Signature(s) Signed: 07/28/2019 4:05:00 PM By: 09/27/2019 Entered By: Christene Lye on 07/28/2019 09:38:52 Farrelly, Katheen A. (Christene Lye) -------------------------------------------------------------------------------- Multi Wound Chart Details Patient Name: 09/27/2019 A. Date of Service: 07/28/2019 9:15 AM Medical Record Number: [id number] Patient Account Number: 10/21/1928 Date of Birth/Sex: March 30, 1929 (86 y.o. F) Treating RN: Dewaine Oats Primary Care Eberardo Demello: Dewaine Oats Other Clinician: Referring Clarice Bonaventure: TATE, Linwood Dibbles Treating Lanell Dubie/Extender: STONE III, HOYT Weeks in Treatment: 21 Vital Signs Height(in): 67 Pulse(bpm): 77 Weight(lbs): 140 Blood Pressure(mmHg): 146/74 Body Mass Index(BMI): 22 Temperature(F): 98.9 Respiratory Rate(breaths/min): 16 Photos: [N/A:N/A] Wound Location: Sacrum - Midline N/A N/A Wounding Event: Pressure Injury N/A N/A Primary Etiology: Pressure Ulcer N/A N/A Comorbid  History: Glaucoma, Hypertension, N/A N/A Osteoarthritis, Dementia Date Acquired: 07/18/2018 N/A N/A Weeks of Treatment: 21 N/A N/A Wound Status: Open N/A N/A Clustered Wound: Yes N/A N/A Clustered Quantity: 1 N/A N/A Measurements L x W x D (cm) 4.7x4x2.5 N/A N/A Area (cm) : 14.765 N/A N/A Volume (cm) : 36.914 N/A N/A % Reduction in Area: 14.50% N/A N/A % Reduction in Volume: -2036.20% N/A N/A Starting Position 1 (o'clock): 7 Ending Position 1 (o'clock):  3 Maximum Distance 1 (cm): 2.3 Undermining: Yes N/A N/A Classification: Category/Stage IV N/A N/A Exudate Amount: Medium N/A N/A Exudate Type: Serous N/A N/A Exudate Color: amber N/A N/A Wound Margin: Indistinct, nonvisible N/A N/A Granulation Amount: Large (67-100%) N/A N/A Granulation Quality: Red N/A N/A Necrotic Amount: Small (1-33%) N/A N/A Exposed Structures: Fat Layer (Subcutaneous Tissue) N/A N/A Exposed: Yes Muscle: Yes Fascia: No Tendon: No Joint: No Bone: No Epithelialization: Medium (34-66%) N/A N/A Treatment Notes GREIDYS, DELAND A. ([id number]) Electronic Signature(s) Signed: 07/28/2019 3:43:50 PM By: Rodell Perna Entered By: Rodell Perna on 07/28/2019 09:57:46 Hammonds, [name] ([id number]) -------------------------------------------------------------------------------- Multi-Disciplinary Care Plan Details Patient Name: [name] Date of Service: 07/28/2019 9:15 AM Medical Record Number: [id number] Patient Account Number: [id number] Date of Birth/Sex: [birth year] (86 y.o. F) Treating RN: Rodell Perna Primary Care Nathania Waldman: Dewaine Oats Other Clinician: Referring Siana Panameno: Dewaine Oats Treating Yovanny Coats/Extender: Linwood Dibbles, HOYT Weeks in Treatment: 21 Active Inactive Abuse / Safety / Falls / Self Care Management Nursing Diagnoses: Potential for falls Goals: Patient will remain injury free related to falls Date Initiated: 03/03/2019 Target Resolution Date: 03/16/2019 Goal Status: Active Interventions: Assess  fall risk on admission and as needed Notes: Medication Nursing Diagnoses: Knowledge deficit related to medication safety: actual or potential Goals: Patient/caregiver will demonstrate understanding of all current medications Date Initiated: 03/03/2019 Target Resolution Date: 03/16/2019 Goal Status: Active Interventions: Assess for medication contraindications each visit where new medications are prescribed Treatment Activities: New medication prescribed at Wound Center : 03/03/2019 Notes: Necrotic Tissue Nursing Diagnoses: Impaired tissue integrity related to necrotic/devitalized tissue Goals: Necrotic/devitalized tissue will be minimized in the wound bed Date Initiated: 03/03/2019 Target Resolution Date: 03/16/2019 Goal Status: Active Interventions: Assess patient pain level pre-, during and post procedure and prior to discharge Treatment Activities: Apply topical anesthetic as ordered : 03/03/2019 Notes: SHASHA, BUCHBINDER ([id number]) Nutrition Nursing Diagnoses: Potential for alteratiion in Nutrition/Potential for imbalanced nutrition Goals: Patient/caregiver agrees to and verbalizes understanding of need to use nutritional supplements and/or vitamins as prescribed Date Initiated: 03/03/2019 Target Resolution Date: 03/16/2019 Goal Status: Active Interventions: Provide education on nutrition Notes: Pressure Nursing Diagnoses: Knowledge deficit related to management of pressures ulcers Goals: Patient/caregiver will verbalize understanding of pressure ulcer management Date Initiated: 03/03/2019 Target Resolution Date: 03/16/2019 Goal Status: Active Interventions: Assess: immobility, friction, shearing, incontinence upon admission and as needed Provide education on pressure ulcers Treatment Activities: Patient referred for home evaluation of offloading devices/mattresses : 03/03/2019 Patient referred for pressure reduction/relief devices : 03/03/2019 Pressure  reduction/relief device ordered : 03/03/2019 Notes: Wound/Skin Impairment Nursing Diagnoses: Impaired tissue integrity Goals: Ulcer/skin breakdown will have a volume reduction of 30% by week 4 Date Initiated: 03/03/2019 Target Resolution Date: 03/03/2019 Goal Status: Active Interventions: Assess ulceration(s) every visit Treatment Activities: Skin care regimen initiated : 03/03/2019 Notes: Electronic Signature(s) Signed: 07/28/2019 3:43:50 PM By: Rodell Perna Entered By: Rodell Perna on 07/28/2019 09:57:38 Grajales, [name] ([id number]) -------------------------------------------------------------------------------- Pain Assessment Details Patient Name: [name] Date of Service: 07/28/2019 9:15 AM Medical Record Number: [id number] Patient Account Number: [id number] Date of Birth/Sex: [birth year] (86 y.o. F) Treating RN: Curtis Sites Primary Care Coran Dipaola: Dewaine Oats Other Clinician: Referring Airianna Kreischer: Dewaine Oats Treating Timesha Cervantez/Extender: Linwood Dibbles, HOYT Weeks in Treatment: 21 Active Problems Location of Pain Severity and Description of Pain Patient Has Paino Patient Unable to Respond Site Locations Pain Management and Medication Current Pain Management: Electronic Signature(s) Signed: 07/28/2019 4:05:00 PM By: Curtis Sites Entered By: Curtis Sites on 07/28/2019 09:38:28 Dortch, Nusaiba A. ([id number]) --------------------------------------------------------------------------------  Patient/Caregiver Education Details Patient Name: RIAH, KEHOE A. Date of Service: 07/28/2019 9:15 AM Medical Record Number: [id number] Patient Account Number: [id number] Date of Birth/Gender: September 01, 1928 (86 y.o. F) Treating RN: Rodell Perna Primary Care Physician: Dewaine Oats Other Clinician: Referring Physician: Dewaine Oats Treating Physician/Extender: Skeet Simmer in Treatment: 21 Education Assessment Education Provided To: Patient Education Topics Provided Wound/Skin  Impairment: Handouts: Caring for Your Ulcer Methods: Demonstration, Explain/Verbal Responses: State content correctly Electronic Signature(s) Signed: 07/28/2019 3:43:50 PM By: Rodell Perna Entered By: Rodell Perna on 07/28/2019 10:01:43 [name] ([id number]) -------------------------------------------------------------------------------- Wound Assessment Details Patient Name: [name] Date of Service: 07/28/2019 9:15 AM Medical Record Number: [id number] Patient Account Number: [id number] Date of Birth/Sex: 1929-02-01 (86 y.o. F) Treating RN: Curtis Sites Primary Care Sandrika Schwinn: Dewaine Oats Other Clinician: Referring Jamiere Gulas: Dewaine Oats Treating Lamar Meter/Extender: STONE III, HOYT Weeks in Treatment: 21 Wound Status Wound Number: 4 Primary Etiology: Pressure Ulcer Wound Location: Sacrum - Midline Wound Status: Open Wounding Event: Pressure Injury Comorbid History: Glaucoma, Hypertension, Osteoarthritis, Dementia Date Acquired: 07/18/2018 Weeks Of Treatment: 21 Clustered Wound: Yes Photos Wound Measurements Length: (cm) 4.7 % Width: (cm) 4 % Depth: (cm) 2.5 Ep Clustered Quantity: 1 Tu Area: (cm) 14.765 U Volume: (cm) 36.914 Reduction in Area: 14.5% Reduction in Volume: -2036.2% ithelialization: Medium (34-66%) nneling: No ndermining: Yes Starting Position (o'clock): 7 Ending Position (o'clock): 3 Maximum Distance: (cm) 2.3 Wound Description Classification: Category/Stage IV F Wound Margin: Indistinct, nonvisible S Exudate Amount: Medium Exudate Type: Serous Exudate Color: amber oul Odor After Cleansing: No lough/Fibrino Yes Wound Bed Granulation Amount: Large (67-100%) Exposed Structure Granulation Quality: Red Fascia Exposed: No Necrotic Amount: Small (1-33%) Fat Layer (Subcutaneous Tissue) Exposed: Yes Necrotic Quality: Adherent Slough Tendon Exposed: No Muscle Exposed: Yes Necrosis of Muscle: No Joint Exposed: No Bone Exposed: No Treatment  Notes Wound #4 (Midline Sacrum) Buysse, Ziyanna A. ([id number]) Notes Dakins gauze with ABD and tape to midline sacrum Electronic Signature(s) Signed: 07/28/2019 4:05:00 PM By: Curtis Sites Entered By: Curtis Sites on 07/28/2019 09:48:19 Satterfield, Meoshia A. ([id number]) -------------------------------------------------------------------------------- Vitals Details Patient Name: [name] Date of Service: 07/28/2019 9:15 AM Medical Record Number: [id number] Patient Account Number: [id number] Date of Birth/Sex: 1928-06-05 (86 y.o. F) Treating RN: Rodell Perna Primary Care Antonette Hendricks: Dewaine Oats Other Clinician: Referring Shantell Belongia: Dewaine Oats Treating Emelyn Roen/Extender: STONE III, HOYT Weeks in Treatment: 21 Vital Signs Time Taken: 09:25 Temperature (F): 98.9 Height (in): 67 Pulse (bpm): 77 Weight (lbs): 140 Respiratory Rate (breaths/min): 16 Body Mass Index (BMI): 21.9 Blood Pressure (mmHg): 146/74 Reference Range: 80 - 120 mg / dl Electronic Signature(s) Signed: 07/28/2019 3:53:04 PM By: Dayton Martes RCP, RRT, CHT Entered By: Dayton Martes on 07/28/2019 09:37:35

## 2019-07-28 NOTE — Progress Notes (Addendum)
TANEKIA, RYANS ([id number]) Visit Report for 07/28/2019 Chief Complaint Document Details Patient Name: [name] [name], [name] Date of Service: 07/28/2019 9:15 AM Medical Record Number: [id number] Patient Account Number: [id number] Date of Birth/Sex: 1929-02-18 (86 y.o. F) Treating RN: Rodell Perna Primary Care Provider: Dewaine Oats Other Clinician: Referring Provider: Dewaine Oats Treating Provider/Extender: Linwood Dibbles, Zuleyka Kloc Weeks in Treatment: 21 Information Obtained from: Patient Chief Complaint Multiple pressure ulcers Electronic Signature(s) Signed: 07/28/2019 9:54:34 AM By: Lenda Kelp PA-C Entered By: Lenda Kelp on 07/28/2019 09:54:33 Purdum, [name] Stacks A. ([id number]) -------------------------------------------------------------------------------- HPI Details Patient Name: [name] Lye A. Date of Service: 07/28/2019 9:15 AM Medical Record Number: [id number] Patient Account Number: [id number] Date of Birth/Sex: 1929/01/08 (86 y.o. F) Treating RN: Rodell Perna Primary Care Provider: Dewaine Oats Other Clinician: Referring Provider: Dewaine Oats Treating Provider/Extender: Linwood Dibbles, Brindy Higginbotham Weeks in Treatment: 21 History of Present Illness HPI Description: 03/03/2019 on evaluation today patient presents for initial evaluation here in the clinic today concerning issues that she has been having with wounds which are pressure in nature at multiple locations currently. This is on her left heel, right ankle, left hip/ischial location, and all her to varying degrees of severity and depth. The worst is on the left ischial location. At this time the patient has been tolerating dressing changes although I am not exactly sure the dressings that have been utilized prior to coming in today. She does have some necrotic tissue noted at several locations this can require at least some degree of cleaning prior to application of dressings going forward. The patient does have dementia and unfortunately in recent months  has become increasingly immobile requiring more significant treatment she was just recently moved roughly 2 weeks ago from assisted living to a skilled nursing facility at [facility]. Based on what I am seeing currently these pressure injuries occurred prior to that 2-week time I do not see any new or obvious pressure injury at this point. I discussed with the patient as well as her family member that this does appear to be doing better in my opinion than likely where things started. They are unsure as to whether or not she has an air mattress at the facility I do think that would be something that would be appropriate and good for her to have but at the moment I do not know for sure whether that is already in place if not that definitely is can be 1 of the recommendations. Also think a Roho cushion for her wheelchair would be good as well as Prevalon offloading boots. The patient currently does not have any severe pain although when I was cleaning some of the areas she did note there was some discomfort at several of the locations. She does have a history of hypertension as well. 10/29; this is a patient from [facility] nursing home. She is nonambulatory. She is here for follow-up on 4 different wounds 1 over the left lateral malleolus, the right medial calcaneus, the left posterior hip and I believe a new area over the sacrum. The patient is nonambulatory but apparently eats well. 03/24/2019 on evaluation today patient actually appears to be doing better with regard to her wounds in general based on what I am seeing. Fortunately there is no signs of active infection at this time. No fever chills noted. Overall been very pleased with the progress that seems to be occurring since I last saw her. There is a little bit of debridement That will need to be performed  today. 04/07/2019 on evaluation today patient actually appears to be doing well in some regards based on what I am seeing today.  Fortunately there is no signs of active infection at this time which is good news. No fever chills noted. With that being said the wounds on her foot and ankle region in general have healed at this point. I do not see anything open in that region. With regard to the other 2 wounds in the left ischium and midline sacral region I believe that we may need to make a dressing change at this point to do something a little bit more conducive to cleaning up the wound bed. The patient is in agreement with that plan today. 04/21/2019 on evaluation today patient actually appears to be doing well with regard to her wounds all things considered. Both appear to be much cleaner than what they were previous. Fortunately there is no signs of active infection at this time. Both I think are doing well with the current wound care measures which is the Dakin moistened gauze dressings. 05/05/2019 on evaluation today patient seems to be doing much better in regard to her ischial ulcer unfortunately she still seems to be getting pressure to the sacral region. I think that this is evidenced by the fact that she does have some surrounding deep tissue injury and the fact that the wound is actually measuring larger not better. Fortunately there is no evidence of active infection at this time I do believe the Dakin's is still the best option treatment wise but again I do think the patient needs more appropriate offloading on the sacral region. 06/24/2019 on evaluation today patient appears to be doing well with regard to the wound over the ischium which appears to be completely healed. This is great news. Fortunately there does appear to be some improvement with regard to the sacral wound as well. There still does seem to be evidence of necrotic tissue noted minimally that I was able to gently clean away. With that being said the wound itself seems to be doing quite well which is good news. She seems to be offloaded appropriately in  my opinion at this point. I think she may be ready for a wound VAC. 07/14/2019 on evaluation today patient appears to be doing well with regard to her sacral wound in general although unfortunately she does appear to have some evidence of potential infection there is some odor that seems to be a little stronger than just what would normally be noted with the wound VAC. With that being said I do think we need to address this and though I think the wound VAC is appropriate to continue I think that we do need to see about a culture as well as placing her on some antibiotic therapy at this point. The patient's granddaughter was present during evaluation today as well. 07/28/2019 upon evaluation today patient unfortunately appears to not be doing quite as well as I would like with regard to the wound VAC and really was not applied appropriately. The phone was applied directly to the skin which did cause a little bit of breakdown around the opening of the wound. Subsequently is also unfortunately the case that the foam that was down at the sacral region and that was tracked up and along her back was not actually connected so they touched this obviously inhibits the ability to be able to properly drain fluid from 1 to the other the have to be continuous and touching. With  that being said there was some improvement today in the overall appearance of the wound bed though obviously size wise not much difference today. We are going to get in touch with the facility to try to make sure that they know what they are doing with applying the wound VAC I am also can likely see her for more frequent follow-up for the time being. All this was discussed with the patient's grandson during the office visit today as well. Electronic Signature(s) ANGELYNA, HENDERSON ([id number]) Signed: 07/28/2019 10:06:30 AM By: Worthy Keeler PA-C Entered By: Worthy Keeler on 07/28/2019 10:06:30 Susa Griffins  ([id number]) -------------------------------------------------------------------------------- Physical Exam Details Patient Name: [name] Date of Service: 07/28/2019 9:15 AM Medical Record Number: [id number] Patient Account Number: [id number] Date of Birth/Sex: May 14, 1929 (86 y.o. F) Treating RN: Army Melia Primary Care Provider: Benita Stabile Other Clinician: Referring Provider: TATE, Sharlet Salina Treating Provider/Extender: STONE III, Shivaan Tierno Weeks in Treatment: 21 Constitutional Chronically ill appearing but in no apparent acute distress. Respiratory normal breathing without difficulty. Psychiatric Patient is not able to cooperate in decision making regarding care. Patient has dementia. pleasant and cooperative. Notes Upon inspection patient's wound bed actually showed signs of good granulation at this time. Overall that is good news there was some breakdown around the perimeter of the wound which does have me more concerned. With that being said I think that we can have to see about educating the patient's caregiver at the facility about what needs to be done in order to properly heal this wound. The wound VAC has to be properly placed. Electronic Signature(s) Signed: 07/28/2019 10:07:11 AM By: Worthy Keeler PA-C Entered By: Worthy Keeler on 07/28/2019 10:07:10 Susa Griffins ([id number]) -------------------------------------------------------------------------------- Physician Orders Details Patient Name: [name] Date of Service: 07/28/2019 9:15 AM Medical Record Number: [id number] Patient Account Number: [id number] Date of Birth/Sex: [birth year] (86 y.o. F) Treating RN: Army Melia Primary Care Provider: Benita Stabile Other Clinician: Referring Provider: Benita Stabile Treating Provider/Extender: Melburn Hake, Wynonna Fitzhenry Weeks in Treatment: 21 Verbal / Phone Orders: No Diagnosis Coding ICD-10 Coding Code Description L89.623 Pressure ulcer of left heel, stage 3 L89.513 Pressure ulcer of  right ankle, stage 3 L89.224 Pressure ulcer of left hip, stage 4 L89.153 Pressure ulcer of sacral region, stage 3 I10 Essential (primary) hypertension F01.50 Vascular dementia without behavioral disturbance Wound Cleansing Wound #4 Midline Sacrum o Clean wound with Normal Saline. o Cleanse wound with mild soap and water Primary Wound Dressing Wound #4 Midline Sacrum o Other: - Dakins moistened gauze packed - Continue this until NPWT is available Secondary Dressing Wound #4 Midline Sacrum o ABD pad - secure with tape - Continue this until NPWT is available Dressing Change Frequency Wound #4 Midline Sacrum o Change Dressing Monday, Wednesday, Friday Follow-up Appointments Wound #4 Midline Sacrum o Return Appointment in 1 week. Off-Loading Wound #4 Midline Sacrum o Roho cushion for wheelchair - pt needs Roho cushion for her chair o Mattress - Pt needs Air mattress o Turn and reposition every 2 hours - Pt is still getting pressure to her midline sacrum. Please reposition pt EVERY 2 HOURS Negative Pressure Wound Therapy Wound #4 Midline Sacrum o Wound VAC settings at 125/130 mmHg continuous pressure. Use BLACK/GREEN foam to wound cavity. Use WHITE foam to fill any tunnel/s and/or undermining. Change VAC dressing 3 X WEEK. Change canister as indicated when full. Nurse may titrate settings and frequency of dressing changes as clinically indicated. - Foam should NOT be applied  directly onto skin. o Home Health Nurse may d/c VAC for s/s of increased infection, significant wound regression, or uncontrolled drainage. [facility] at [phone]. o Number of foam/gauze pieces used in the dressing = - Please bridge NPWT to either side Electronic Signature(s) Signed: 07/28/2019 3:43:50 PM By: Will Bonnet, [facility]. ([id number]) Signed: 07/29/2019 4:19:26 PM By: Lenda Kelp PA-C Entered By: Rodell Perna on 07/28/2019 10:03:24 [name] Lye A.  ([id number]) -------------------------------------------------------------------------------- Problem List Details Patient Name: [name] Lye A. Date of Service: 07/28/2019 9:15 AM Medical Record Number: [id number] Patient Account Number: [id number] Date of Birth/Sex: 1928/10/07 (86 y.o. F) Treating RN: Rodell Perna Primary Care Provider: Dewaine Oats Other Clinician: Referring Provider: Dewaine Oats Treating Provider/Extender: Linwood Dibbles, Olaoluwa Grieder Weeks in Treatment: 21 Active Problems ICD-10 Evaluated Encounter Code Description Active Date Today Diagnosis L89.623 Pressure ulcer of left heel, stage 3 03/03/2019 No Yes L89.513 Pressure ulcer of right ankle, stage 3 03/03/2019 No Yes L89.224 Pressure ulcer of left hip, stage 4 03/03/2019 No Yes L89.153 Pressure ulcer of sacral region, stage 3 03/24/2019 No Yes I10 Essential (primary) hypertension 03/03/2019 No Yes F01.50 Vascular dementia without behavioral disturbance 03/03/2019 No Yes Inactive Problems Resolved Problems Electronic Signature(s) Signed: 07/28/2019 9:54:28 AM By: Lenda Kelp PA-C Entered By: Lenda Kelp on 07/28/2019 09:54:27 Delarocha, [name] Stacks A. ([id number]) -------------------------------------------------------------------------------- Progress Note Details Patient Name: [name] Lye A. Date of Service: 07/28/2019 9:15 AM Medical Record Number: [id number] Patient Account Number: [id number] Date of Birth/Sex: 31-Aug-1928 (86 y.o. F) Treating RN: Rodell Perna Primary Care Provider: Dewaine Oats Other Clinician: Referring Provider: Dewaine Oats Treating Provider/Extender: Linwood Dibbles, Jeraldin Fesler Weeks in Treatment: 21 Subjective Chief Complaint Information obtained from Patient Multiple pressure ulcers History of Present Illness (HPI) 03/03/2019 on evaluation today patient presents for initial evaluation here in the clinic today concerning issues that she has been having with wounds which are pressure in nature at multiple locations  currently. This is on her left heel, right ankle, left hip/ischial location, and all her to varying degrees of severity and depth. The worst is on the left ischial location. At this time the patient has been tolerating dressing changes although I am not exactly sure the dressings that have been utilized prior to coming in today. She does have some necrotic tissue noted at several locations this can require at least some degree of cleaning prior to application of dressings going forward. The patient does have dementia and unfortunately in recent months has become increasingly immobile requiring more significant treatment she was just recently moved roughly 2 weeks ago from assisted living to a skilled nursing facility at [facility]. Based on what I am seeing currently these pressure injuries occurred prior to that 2-week time I do not see any new or obvious pressure injury at this point. I discussed with the patient as well as her family member that this does appear to be doing better in my opinion than likely where things started. They are unsure as to whether or not she has an air mattress at the facility I do think that would be something that would be appropriate and good for her to have but at the moment I do not know for sure whether that is already in place if not that definitely is can be 1 of the recommendations. Also think a Roho cushion for her wheelchair would be good as well as Prevalon offloading boots. The patient currently does not have any severe pain although when I was cleaning some  of the areas she did note there was some discomfort at several of the locations. She does have a history of hypertension as well. 10/29; this is a patient from [facility] nursing home. She is nonambulatory. She is here for follow-up on 4 different wounds 1 over the left lateral malleolus, the right medial calcaneus, the left posterior hip and I believe a new area over the sacrum. The patient is  nonambulatory but apparently eats well. 03/24/2019 on evaluation today patient actually appears to be doing better with regard to her wounds in general based on what I am seeing. Fortunately there is no signs of active infection at this time. No fever chills noted. Overall been very pleased with the progress that seems to be occurring since I last saw her. There is a little bit of debridement That will need to be performed today. 04/07/2019 on evaluation today patient actually appears to be doing well in some regards based on what I am seeing today. Fortunately there is no signs of active infection at this time which is good news. No fever chills noted. With that being said the wounds on her foot and ankle region in general have healed at this point. I do not see anything open in that region. With regard to the other 2 wounds in the left ischium and midline sacral region I believe that we may need to make a dressing change at this point to do something a little bit more conducive to cleaning up the wound bed. The patient is in agreement with that plan today. 04/21/2019 on evaluation today patient actually appears to be doing well with regard to her wounds all things considered. Both appear to be much cleaner than what they were previous. Fortunately there is no signs of active infection at this time. Both I think are doing well with the current wound care measures which is the Dakin moistened gauze dressings. 05/05/2019 on evaluation today patient seems to be doing much better in regard to her ischial ulcer unfortunately she still seems to be getting pressure to the sacral region. I think that this is evidenced by the fact that she does have some surrounding deep tissue injury and the fact that the wound is actually measuring larger not better. Fortunately there is no evidence of active infection at this time I do believe the Dakin's is still the best option treatment wise but again I do think the  patient needs more appropriate offloading on the sacral region. 06/24/2019 on evaluation today patient appears to be doing well with regard to the wound over the ischium which appears to be completely healed. This is great news. Fortunately there does appear to be some improvement with regard to the sacral wound as well. There still does seem to be evidence of necrotic tissue noted minimally that I was able to gently clean away. With that being said the wound itself seems to be doing quite well which is good news. She seems to be offloaded appropriately in my opinion at this point. I think she may be ready for a wound VAC. 07/14/2019 on evaluation today patient appears to be doing well with regard to her sacral wound in general although unfortunately she does appear to have some evidence of potential infection there is some odor that seems to be a little stronger than just what would normally be noted with the wound VAC. With that being said I do think we need to address this and though I think the wound VAC  is appropriate to continue I think that we do need to see about a culture as well as placing her on some antibiotic therapy at this point. The patient's granddaughter was present during evaluation today as well. 07/28/2019 upon evaluation today patient unfortunately appears to not be doing quite as well as I would like with regard to the wound VAC and really was not applied appropriately. The phone was applied directly to the skin which did cause a little bit of breakdown around the opening of the wound. Subsequently is also unfortunately the case that the foam that was down at the sacral region and that was tracked up and along her back was not actually connected so they touched this obviously inhibits the ability to be able to properly drain fluid from 1 to the other the have to be continuous and touching. With that being said there was some improvement today in the overall appearance of the wound  bed though obviously size wise not much difference today. We are going to get in touch with the facility to try to make sure that they know what they are doing with applying the wound VAC I am Brouhard, [facility] A. ([id number]) also can likely see her for more frequent follow-up for the time being. All this was discussed with the patient's grandson during the office visit today as well. Objective Constitutional Chronically ill appearing but in no apparent acute distress. Vitals Time Taken: 9:25 AM, Height: 67 in, Weight: 140 lbs, BMI: 21.9, Temperature: 98.9 F, Pulse: 77 bpm, Respiratory Rate: 16 breaths/min, Blood Pressure: 146/74 mmHg. Respiratory normal breathing without difficulty. Psychiatric Patient is not able to cooperate in decision making regarding care. Patient has dementia. pleasant and cooperative. General Notes: Upon inspection patient's wound bed actually showed signs of good granulation at this time. Overall that is good news there was some breakdown around the perimeter of the wound which does have me more concerned. With that being said I think that we can have to see about educating the patient's caregiver at the facility about what needs to be done in order to properly heal this wound. The wound VAC has to be properly placed. Integumentary (Hair, Skin) Wound #4 status is Open. Original cause of wound was Pressure Injury. The wound is located on the Midline Sacrum. The wound measures 4.7cm length x 4cm width x 2.5cm depth; 14.765cm^2 area and 36.914cm^3 volume. There is muscle and Fat Layer (Subcutaneous Tissue) Exposed exposed. There is no tunneling noted, however, there is undermining starting at 7:00 and ending at 3:00 with a maximum distance of 2.3cm. There is a medium amount of serous drainage noted. The wound margin is indistinct and nonvisible. There is large (67-100%) red granulation within the wound bed. There is a small (1-33%) amount of necrotic tissue within the wound  bed including Adherent Slough. Assessment Active Problems ICD-10 Pressure ulcer of left heel, stage 3 Pressure ulcer of right ankle, stage 3 Pressure ulcer of left hip, stage 4 Pressure ulcer of sacral region, stage 3 Essential (primary) hypertension Vascular dementia without behavioral disturbance Plan Wound Cleansing: Wound #4 Midline Sacrum: Clean wound with Normal Saline. Cleanse wound with mild soap and water Primary Wound Dressing: Wound #4 Midline Sacrum: Other: - Dakins moistened gauze packed - Continue this until NPWT is available Secondary Dressing: [name] [name], [name] ([id number]) Wound #4 Midline Sacrum: ABD pad - secure with tape - Continue this until NPWT is available Dressing Change Frequency: Wound #4 Midline Sacrum: Change Dressing Monday, Wednesday, Friday Follow-up  Appointments: Wound #4 Midline Sacrum: Return Appointment in 1 week. Off-Loading: Wound #4 Midline Sacrum: Roho cushion for wheelchair - pt needs Roho cushion for her chair Mattress - Pt needs Air mattress Turn and reposition every 2 hours - Pt is still getting pressure to her midline sacrum. Please reposition pt EVERY 2 HOURS Negative Pressure Wound Therapy: Wound #4 Midline Sacrum: Wound VAC settings at 125/130 mmHg continuous pressure. Use BLACK/GREEN foam to wound cavity. Use WHITE foam to fill any tunnel/s and/or undermining. Change VAC dressing 3 X WEEK. Change canister as indicated when full. Nurse may titrate settings and frequency of dressing changes as clinically indicated. - Foam should NOT be applied directly onto skin. Home Health Nurse may d/c VAC for s/s of increased infection, significant wound regression, or uncontrolled drainage. [facility] at [phone]. Number of foam/gauze pieces used in the dressing = - Please bridge NPWT to either side 1. My suggestion at this time is good to be that we go ahead and continue with the current wound care measures specifically  with regard to the wound VAC that we need to contact the facility to discuss how they are applying the wound VAC to make sure this is done properly. 2. I am also can recommend that we continue with the appropriate offloading measures including the Roho cushion, air mattress, and turning and repositioning every 2 hours. 3. From the standpoint of infection things seem to be doing better in my opinion based on what I am seeing today. 4. All the information above was discussed with the patient's family today I do think that based on how the wound VAC was applied some training may need to be done in that regard and will discuss that with the facility when we call. We will see patient back for reevaluation in 1 week here in the clinic. If anything worsens or changes patient will contact our office for additional recommendations. They wanted a more frequent and close follow-ups in order to ensure that this is done properly at the next visit. Electronic Signature(s) Signed: 07/28/2019 10:08:28 AM By: Lenda KelpStone III, Aashrith Eves PA-C Entered By: Lenda KelpStone III, Angeles Paolucci on 07/28/2019 10:08:27 [name] [name], [name] ([id number]) -------------------------------------------------------------------------------- SuperBill Details Patient Name: [name] [name], [name] Date of Service: 07/28/2019 Medical Record Number: [id number] Patient Account Number: [id number] Date of Birth/Sex: 05/03/1929 (86 y.o. F) Treating RN: Rodell PernaScott, Dajea Primary Care Provider: Dewaine OatsATE, DENNY Other Clinician: Referring Provider: Dewaine OatsATE, DENNY Treating Provider/Extender: Linwood DibblesSTONE III, Chrisopher Pustejovsky Weeks in Treatment: 21 Diagnosis Coding ICD-10 Codes Code Description [phone] Pressure ulcer of left heel, stage 3 L89.513 Pressure ulcer of right ankle, stage 3 L89.224 Pressure ulcer of left hip, stage 4 L89.153 Pressure ulcer of sacral region, stage 3 I10 Essential (primary) hypertension F01.50 Vascular dementia without behavioral disturbance Facility Procedures CPT4 Code:  [id number] Description: 99213 - WOUND CARE VISIT-LEV 3 EST PT Modifier: Quantity: 1 Physician Procedures CPT4 Code: [id number] Description: 99214 - WC PHYS LEVEL 4 - EST PT Modifier: Quantity: 1 CPT4 Code: Description: ICD-10 Diagnosis Description L89.153 Pressure ulcer of sacral region, stage 3 I10 Essential (primary) hypertension F01.50 Vascular dementia without behavioral disturbance Modifier: Quantity: Electronic Signature(s) Signed: 07/28/2019 10:09:01 AM By: Lenda KelpStone III, Maliyah Willets PA-C Entered By: Lenda KelpStone III, Edeline Greening on 07/28/2019 10:09:01

## 2019-08-04 ENCOUNTER — Ambulatory Visit: Payer: Medicare Other | Admitting: Physician Assistant

## 2019-08-11 ENCOUNTER — Other Ambulatory Visit: Payer: Self-pay

## 2019-08-11 ENCOUNTER — Encounter: Payer: Medicare Other | Admitting: Physician Assistant

## 2019-08-11 DIAGNOSIS — L89153 Pressure ulcer of sacral region, stage 3: Secondary | ICD-10-CM | POA: Diagnosis not present

## 2019-08-11 NOTE — Progress Notes (Addendum)
[name], [name] ([id number]) Visit Report for 08/11/2019 Chief Complaint Document Details Patient Name: [name], [name] Date of Service: 08/11/2019 8:45 AM Medical Record Number: [id number] Patient Account Number: [id number] Date of Birth/Sex: 05-Jul-1928 (86 y.o. F) Treating RN: Rodell Perna Primary Care Provider: Dewaine Oats Other Clinician: Referring Provider: Dewaine Oats Treating Provider/Extender: Linwood Dibbles, Cayle Cordoba Weeks in Treatment: 23 Information Obtained from: Patient Chief Complaint Multiple pressure ulcers Electronic Signature(s) Signed: 08/11/2019 8:50:59 AM By: Lenda Kelp PA-C Entered By: Lenda Kelp on 08/11/2019 08:50:58 [name], [name] ([id number]) -------------------------------------------------------------------------------- HPI Details Patient Name: [name] Lye A. Date of Service: 08/11/2019 8:45 AM Medical Record Number: [id number] Patient Account Number: [id number] Date of Birth/Sex: [birth year] (86 y.o. F) Treating RN: Rodell Perna Primary Care Provider: Dewaine Oats Other Clinician: Referring Provider: Dewaine Oats Treating Provider/Extender: Linwood Dibbles, Amayra Kiedrowski Weeks in Treatment: 23 History of Present Illness HPI Description: 03/03/2019 on evaluation today patient presents for initial evaluation here in the clinic today concerning issues that she has been having with wounds which are pressure in nature at multiple locations currently. This is on her left heel, right ankle, left hip/ischial location, and all her to varying degrees of severity and depth. The worst is on the left ischial location. At this time the patient has been tolerating dressing changes although I am not exactly sure the dressings that have been utilized prior to coming in today. She does have some necrotic tissue noted at several locations this can require at least some degree of cleaning prior to application of dressings going forward. The patient does have dementia and unfortunately in recent months  has become increasingly immobile requiring more significant treatment she was just recently moved roughly 2 weeks ago from assisted living to a skilled nursing facility at [facility]. Based on what I am seeing currently these pressure injuries occurred prior to that 2-week time I do not see any new or obvious pressure injury at this point. I discussed with the patient as well as her family member that this does appear to be doing better in my opinion than likely where things started. They are unsure as to whether or not she has an air mattress at the facility I do think that would be something that would be appropriate and good for her to have but at the moment I do not know for sure whether that is already in place if not that definitely is can be 1 of the recommendations. Also think a Roho cushion for her wheelchair would be good as well as Prevalon offloading boots. The patient currently does not have any severe pain although when I was cleaning some of the areas she did note there was some discomfort at several of the locations. She does have a history of hypertension as well. 10/29; this is a patient from [facility] nursing home. She is nonambulatory. She is here for follow-up on 4 different wounds 1 over the left lateral malleolus, the right medial calcaneus, the left posterior hip and I believe a new area over the sacrum. The patient is nonambulatory but apparently eats well. 03/24/2019 on evaluation today patient actually appears to be doing better with regard to her wounds in general based on what I am seeing. Fortunately there is no signs of active infection at this time. No fever chills noted. Overall been very pleased with the progress that seems to be occurring since I last saw her. There is a little bit of debridement That will need to be performed  today. 04/07/2019 on evaluation today patient actually appears to be doing well in some regards based on what I am seeing today.  Fortunately there is no signs of active infection at this time which is good news. No fever chills noted. With that being said the wounds on her foot and ankle region in general have healed at this point. I do not see anything open in that region. With regard to the other 2 wounds in the left ischium and midline sacral region I believe that we may need to make a dressing change at this point to do something a little bit more conducive to cleaning up the wound bed. The patient is in agreement with that plan today. 04/21/2019 on evaluation today patient actually appears to be doing well with regard to her wounds all things considered. Both appear to be much cleaner than what they were previous. Fortunately there is no signs of active infection at this time. Both I think are doing well with the current wound care measures which is the Dakin moistened gauze dressings. 05/05/2019 on evaluation today patient seems to be doing much better in regard to her ischial ulcer unfortunately she still seems to be getting pressure to the sacral region. I think that this is evidenced by the fact that she does have some surrounding deep tissue injury and the fact that the wound is actually measuring larger not better. Fortunately there is no evidence of active infection at this time I do believe the Dakin's is still the best option treatment wise but again I do think the patient needs more appropriate offloading on the sacral region. 06/24/2019 on evaluation today patient appears to be doing well with regard to the wound over the ischium which appears to be completely healed. This is great news. Fortunately there does appear to be some improvement with regard to the sacral wound as well. There still does seem to be evidence of necrotic tissue noted minimally that I was able to gently clean away. With that being said the wound itself seems to be doing quite well which is good news. She seems to be offloaded appropriately in  my opinion at this point. I think she may be ready for a wound VAC. 07/14/2019 on evaluation today patient appears to be doing well with regard to her sacral wound in general although unfortunately she does appear to have some evidence of potential infection there is some odor that seems to be a little stronger than just what would normally be noted with the wound VAC. With that being said I do think we need to address this and though I think the wound VAC is appropriate to continue I think that we do need to see about a culture as well as placing her on some antibiotic therapy at this point. The patient's granddaughter was present during evaluation today as well. 07/28/2019 upon evaluation today patient unfortunately appears to not be doing quite as well as I would like with regard to the wound VAC and really was not applied appropriately. The phone was applied directly to the skin which did cause a little bit of breakdown around the opening of the wound. Subsequently is also unfortunately the case that the foam that was down at the sacral region and that was tracked up and along her back was not actually connected so they touched this obviously inhibits the ability to be able to properly drain fluid from 1 to the other the have to be continuous and touching. With  that being said there was some improvement today in the overall appearance of the wound bed though obviously size wise not much difference today. We are going to get in touch with the facility to try to make sure that they know what they are doing with applying the wound VAC I am also can likely see her for more frequent follow-up for the time being. All this was discussed with the patient's grandson during the office visit today as well. 08/11/19 upon evaluation today patient appears to be doing poorly in regard to her wound at this time. Unfortunately there appears to be infection at this [name] [name], [name] ([id number]) time including blue-green  drainage and a poor quality to the granulation tissue all of which point toward infection she also seems to be much more painful than we noticed in the past. She is moving around a lot just with very light touch around the area I think that is an indication of discomfort Electronic Signature(s) Signed: 08/11/2019 9:14:15 AM By: Lenda Kelp PA-C Entered By: Lenda Kelp on 08/11/2019 09:14:15 [name] [name], [name] [name] ([id number]) -------------------------------------------------------------------------------- Physical Exam Details Patient Name: [name] Lye A. Date of Service: 08/11/2019 8:45 AM Medical Record Number: [id number] Patient Account Number: [id number] Date of Birth/Sex: 11-03-1928 (86 y.o. F) Treating RN: Rodell Perna Primary Care Provider: Dewaine Oats Other Clinician: Referring Provider: TATE, Katherina Right Treating Provider/Extender: STONE III, Everleigh Colclasure Weeks in Treatment: 23 Constitutional Well-nourished and well-hydrated in no acute distress. Respiratory normal breathing without difficulty. Psychiatric this patient is able to make decisions and demonstrates good insight into disease process. Alert and Oriented x 3. pleasant and cooperative. Notes Upon inspection patient's wound bed actually showed signs of poor granulation quality with evidence of green drainage around the edges of the wound and a smell consistent with Pseudomonas. I believe this is most likely the causative organism right now for the patient. With that being said I do believe that Cipro would be a good option for her if there are no interactions with her current medications. Also think we probably need to put wound VAC on hold. Electronic Signature(s) Signed: 08/11/2019 9:14:51 AM By: Lenda Kelp PA-C Entered By: Lenda Kelp on 08/11/2019 09:14:51 [name] [name], [name] [name] ([id number]) -------------------------------------------------------------------------------- Physician Orders Details Patient Name: [name] Lye A. Date of  Service: 08/11/2019 8:45 AM Medical Record Number: [id number] Patient Account Number: [id number] Date of Birth/Sex: 1929-05-14 (86 y.o. F) Treating RN: Rodell Perna Primary Care Provider: Dewaine Oats Other Clinician: Referring Provider: Dewaine Oats Treating Provider/Extender: Linwood Dibbles, Ragna Kramlich Weeks in Treatment: 108 Verbal / Phone Orders: No Diagnosis Coding ICD-10 Coding Code Description L89.623 Pressure ulcer of left heel, stage 3 L89.513 Pressure ulcer of right ankle, stage 3 L89.224 Pressure ulcer of left hip, stage 4 L89.153 Pressure ulcer of sacral region, stage 3 I10 Essential (primary) hypertension F01.50 Vascular dementia without behavioral disturbance Wound Cleansing Wound #4 Midline Sacrum o Clean wound with Normal Saline. o Cleanse wound with mild soap and water Primary Wound Dressing Wound #4 Midline Sacrum o Other: - Dakins moistened gauze packed Secondary Dressing Wound #4 Midline Sacrum o ABD pad - secure with tape Dressing Change Frequency Wound #4 Midline Sacrum o Change dressing every day. Follow-up Appointments Wound #4 Midline Sacrum o Return Appointment in 1 week. Off-Loading Wound #4 Midline Sacrum o Roho cushion for wheelchair - pt needs Roho cushion for her chair o Mattress - Pt needs Air mattress o Turn and reposition every 2 hours - Pt is still getting pressure to  her midline sacrum. Please reposition pt EVERY 2 HOURS Negative Pressure Wound Therapy Wound #4 Midline Sacrum o Place NPWT on HOLD. - Until infection is under control Laboratory o Bacteria identified in Wound by Culture (MICRO) - Sacrum oooo LOINC Code: 6462-6 oooo Convenience Name: Wound culture routine [name] [name], [name] ([id number]) Electronic Signature(s) Signed: 08/11/2019 9:16:23 AM By: Rodell Perna Signed: 08/11/2019 4:59:35 PM By: Lenda Kelp PA-C Entered By: Rodell Perna on 08/11/2019 09:16:22 [name] Lye A.  ([id number]) -------------------------------------------------------------------------------- Problem List Details Patient Name: [name] Lye A. Date of Service: 08/11/2019 8:45 AM Medical Record Number: [id number] Patient Account Number: [id number] Date of Birth/Sex: 01/20/1929 (86 y.o. F) Treating RN: Rodell Perna Primary Care Provider: Dewaine Oats Other Clinician: Referring Provider: Dewaine Oats Treating Provider/Extender: Linwood Dibbles, Kayla Weekes Weeks in Treatment: 23 Active Problems ICD-10 Evaluated Encounter Code Description Active Date Today Diagnosis L89.623 Pressure ulcer of left heel, stage 3 03/03/2019 No Yes L89.513 Pressure ulcer of right ankle, stage 3 03/03/2019 No Yes L89.224 Pressure ulcer of left hip, stage 4 03/03/2019 No Yes L89.153 Pressure ulcer of sacral region, stage 3 03/24/2019 No Yes I10 Essential (primary) hypertension 03/03/2019 No Yes F01.50 Vascular dementia without behavioral disturbance 03/03/2019 No Yes Inactive Problems Resolved Problems Electronic Signature(s) Signed: 08/11/2019 8:50:50 AM By: Lenda Kelp PA-C Entered By: Lenda Kelp on 08/11/2019 08:50:50 Shakoor, [name] ([id number]) -------------------------------------------------------------------------------- Progress Note Details Patient Name: [name] Lye A. Date of Service: 08/11/2019 8:45 AM Medical Record Number: [id number] Patient Account Number: [id number] Date of Birth/Sex: 01-18-1929 (86 y.o. F) Treating RN: Rodell Perna Primary Care Provider: Dewaine Oats Other Clinician: Referring Provider: Dewaine Oats Treating Provider/Extender: Linwood Dibbles, Emersynn Deatley Weeks in Treatment: 23 Subjective Chief Complaint Information obtained from Patient Multiple pressure ulcers History of Present Illness (HPI) 03/03/2019 on evaluation today patient presents for initial evaluation here in the clinic today concerning issues that she has been having with wounds which are pressure in nature at multiple locations  currently. This is on her left heel, right ankle, left hip/ischial location, and all her to varying degrees of severity and depth. The worst is on the left ischial location. At this time the patient has been tolerating dressing changes although I am not exactly sure the dressings that have been utilized prior to coming in today. She does have some necrotic tissue noted at several locations this can require at least some degree of cleaning prior to application of dressings going forward. The patient does have dementia and unfortunately in recent months has become increasingly immobile requiring more significant treatment she was just recently moved roughly 2 weeks ago from assisted living to a skilled nursing facility at [facility]. Based on what I am seeing currently these pressure injuries occurred prior to that 2-week time I do not see any new or obvious pressure injury at this point. I discussed with the patient as well as her family member that this does appear to be doing better in my opinion than likely where things started. They are unsure as to whether or not she has an air mattress at the facility I do think that would be something that would be appropriate and good for her to have but at the moment I do not know for sure whether that is already in place if not that definitely is can be 1 of the recommendations. Also think a Roho cushion for her wheelchair would be good as well as Prevalon offloading boots. The patient currently does not have any severe pain although  when I was cleaning some of the areas she did note there was some discomfort at several of the locations. She does have a history of hypertension as well. 10/29; this is a patient from [facility] nursing home. She is nonambulatory. She is here for follow-up on 4 different wounds 1 over the left lateral malleolus, the right medial calcaneus, the left posterior hip and I believe a new area over the sacrum. The patient is  nonambulatory but apparently eats well. 03/24/2019 on evaluation today patient actually appears to be doing better with regard to her wounds in general based on what I am seeing. Fortunately there is no signs of active infection at this time. No fever chills noted. Overall been very pleased with the progress that seems to be occurring since I last saw her. There is a little bit of debridement That will need to be performed today. 04/07/2019 on evaluation today patient actually appears to be doing well in some regards based on what I am seeing today. Fortunately there is no signs of active infection at this time which is good news. No fever chills noted. With that being said the wounds on her foot and ankle region in general have healed at this point. I do not see anything open in that region. With regard to the other 2 wounds in the left ischium and midline sacral region I believe that we may need to make a dressing change at this point to do something a little bit more conducive to cleaning up the wound bed. The patient is in agreement with that plan today. 04/21/2019 on evaluation today patient actually appears to be doing well with regard to her wounds all things considered. Both appear to be much cleaner than what they were previous. Fortunately there is no signs of active infection at this time. Both I think are doing well with the current wound care measures which is the Dakin moistened gauze dressings. 05/05/2019 on evaluation today patient seems to be doing much better in regard to her ischial ulcer unfortunately she still seems to be getting pressure to the sacral region. I think that this is evidenced by the fact that she does have some surrounding deep tissue injury and the fact that the wound is actually measuring larger not better. Fortunately there is no evidence of active infection at this time I do believe the Dakin's is still the best option treatment wise but again I do think the  patient needs more appropriate offloading on the sacral region. 06/24/2019 on evaluation today patient appears to be doing well with regard to the wound over the ischium which appears to be completely healed. This is great news. Fortunately there does appear to be some improvement with regard to the sacral wound as well. There still does seem to be evidence of necrotic tissue noted minimally that I was able to gently clean away. With that being said the wound itself seems to be doing quite well which is good news. She seems to be offloaded appropriately in my opinion at this point. I think she may be ready for a wound VAC. 07/14/2019 on evaluation today patient appears to be doing well with regard to her sacral wound in general although unfortunately she does appear to have some evidence of potential infection there is some odor that seems to be a little stronger than just what would normally be noted with the wound VAC. With that being said I do think we need to address this and though  I think the wound VAC is appropriate to continue I think that we do need to see about a culture as well as placing her on some antibiotic therapy at this point. The patient's granddaughter was present during evaluation today as well. 07/28/2019 upon evaluation today patient unfortunately appears to not be doing quite as well as I would like with regard to the wound VAC and really was not applied appropriately. The phone was applied directly to the skin which did cause a little bit of breakdown around the opening of the wound. Subsequently is also unfortunately the case that the foam that was down at the sacral region and that was tracked up and along her back was not actually connected so they touched this obviously inhibits the ability to be able to properly drain fluid from 1 to the other the have to be continuous and touching. With that being said there was some improvement today in the overall appearance of the wound  bed though obviously size wise not much difference today. We are going to get in touch with the facility to try to make sure that they know what they are doing with applying the wound VAC I am [name] [name], [name] ([id number]) also can likely see her for more frequent follow-up for the time being. All this was discussed with the patient's grandson during the office visit today as well. 08/11/19 upon evaluation today patient appears to be doing poorly in regard to her wound at this time. Unfortunately there appears to be infection at this time including blue-green drainage and a poor quality to the granulation tissue all of which point toward infection she also seems to be much more painful than we noticed in the past. She is moving around a lot just with very light touch around the area I think that is an indication of discomfort Objective Constitutional Well-nourished and well-hydrated in no acute distress. Vitals Time Taken: 8:56 AM, Height: 67 in, Weight: 140 lbs, BMI: 21.9, Temperature: 98.2 F, Pulse: 98 bpm, Respiratory Rate: 16 breaths/min, Blood Pressure: 133/69 mmHg. Respiratory normal breathing without difficulty. Psychiatric this patient is able to make decisions and demonstrates good insight into disease process. Alert and Oriented x 3. pleasant and cooperative. General Notes: Upon inspection patient's wound bed actually showed signs of poor granulation quality with evidence of green drainage around the edges of the wound and a smell consistent with Pseudomonas. I believe this is most likely the causative organism right now for the patient. With that being said I do believe that Cipro would be a good option for her if there are no interactions with her current medications. Also think we probably need to put wound VAC on hold. Integumentary (Hair, Skin) Wound #4 status is Open. Original cause of wound was Pressure Injury. The wound is located on the Midline Sacrum. The wound measures  4cm length x 4cm width x 2.7cm depth; 12.566cm^2 area and 33.929cm^3 volume. There is muscle and Fat Layer (Subcutaneous Tissue) Exposed exposed. There is undermining starting at 12:00 and ending at 6:00 with a maximum distance of 2cm. There is a medium amount of serous drainage noted. The wound margin is indistinct and nonvisible. There is large (67-100%) red granulation within the wound bed. There is a small (1-33%) amount of necrotic tissue within the wound bed including Adherent Slough. Assessment Active Problems ICD-10 Pressure ulcer of left heel, stage 3 Pressure ulcer of right ankle, stage 3 Pressure ulcer of left hip, stage 4 Pressure ulcer of sacral region,  stage 3 Essential (primary) hypertension Vascular dementia without behavioral disturbance Plan Wound Cleansing: Wound #4 Midline Sacrum: Clean wound with Normal Saline. Cleanse wound with mild soap and water Primary Wound Dressing: [name] [name], [name] ([id number]) Wound #4 Midline Sacrum: Other: - Dakins moistened gauze packed Secondary Dressing: Wound #4 Midline Sacrum: ABD pad - secure with tape Dressing Change Frequency: Wound #4 Midline Sacrum: Change dressing every day. Follow-up Appointments: Wound #4 Midline Sacrum: Return Appointment in 1 week. Off-Loading: Wound #4 Midline Sacrum: Roho cushion for wheelchair - pt needs Roho cushion for her chair Mattress - Pt needs Air mattress Turn and reposition every 2 hours - Pt is still getting pressure to her midline sacrum. Please reposition pt EVERY 2 HOURS Negative Pressure Wound Therapy: Wound #4 Midline Sacrum: Place NPWT on HOLD. - Until infection is under control Laboratory ordered were: Wound culture routine - Sacrum 1. My suggestion at this time again is good to be that we put the wound VAC on hold for the next week I would like to see her back in 1 week's time to reevaluate and see where things stand. 2.My initial suggestion was to place the patient on Cipro  but again as before Cipro actually interacts with 2 of the patient's medications which unfortunately can cause QT prolongation. Obviously that is not something we want to have occurred. With that being said I am in a have to go ahead and treat this topically with the Dakin's for the time being we will get a have to go ahead and send a culture as well and then depending on the culture initiate treatment as needed going forward. The patient may need to be seen by infectious disease if we can get this under control just within the facility. 3. I am also can recommend continued and appropriate offloading. 4. In the interim until we can get the wound VAC back on we will utilize Dakin's moistened gauze packing into the wound location. We will see patient back for reevaluation in 1 week here in the clinic. If anything worsens or changes patient will contact our office for additional recommendations. Electronic Signature(s) Signed: 08/11/2019 2:14:59 PM By: Worthy Keeler PA-C Previous Signature: 08/11/2019 9:15:45 AM Version By: Worthy Keeler PA-C Entered By: Worthy Keeler on 08/11/2019 14:14:58 [name] [name], [name] Capers A. ([id number]) -------------------------------------------------------------------------------- SuperBill Details Patient Name: [name] Roan A. Date of Service: 08/11/2019 Medical Record Number: [id number] Patient Account Number: [id number] Date of Birth/Sex: 06-Aug-1928 (86 y.o. F) Treating RN: Army Melia Primary Care Provider: Benita Stabile Other Clinician: Referring Provider: Benita Stabile Treating Provider/Extender: Melburn Hake, Harmonie Verrastro Weeks in Treatment: 23 Diagnosis Coding ICD-10 Codes Code Description [phone] Pressure ulcer of left heel, stage 3 L89.513 Pressure ulcer of right ankle, stage 3 L89.224 Pressure ulcer of left hip, stage 4 L89.153 Pressure ulcer of sacral region, stage 3 I10 Essential (primary) hypertension F01.50 Vascular dementia without behavioral disturbance Facility  Procedures CPT4 Code: [id number] Description: 99213 - WOUND CARE VISIT-LEV 3 EST PT Modifier: Quantity: 1 Physician Procedures CPT4 Code: 6629476 Description: 54650 - WC PHYS LEVEL 4 - EST PT Modifier: Quantity: 1 CPT4 Code: Description: ICD-10 Diagnosis Description L89.623 Pressure ulcer of left heel, stage 3 L89.513 Pressure ulcer of right ankle, stage 3 L89.224 Pressure ulcer of left hip, stage 4 L89.153 Pressure ulcer of sacral region, stage 3 Modifier: Quantity: Electronic Signature(s) Signed: 08/11/2019 2:15:12 PM By: Worthy Keeler PA-C Entered By: Worthy Keeler on 08/11/2019 14:15:12

## 2019-08-12 ENCOUNTER — Other Ambulatory Visit
Admission: RE | Admit: 2019-08-12 | Discharge: 2019-08-12 | Disposition: A | Discharge status: Home / Self Care | Payer: Medicare Other | Source: Ambulatory Visit | Attending: Physician Assistant | Admitting: Physician Assistant

## 2019-08-12 DIAGNOSIS — L03818 Cellulitis of other sites: Secondary | ICD-10-CM | POA: Diagnosis present

## 2019-08-15 LAB — AEROBIC CULTURE W GRAM STAIN (SUPERFICIAL SPECIMEN)

## 2019-08-15 LAB — AEROBIC CULTURE? (SUPERFICIAL SPECIMEN)

## 2019-08-17 NOTE — Progress Notes (Signed)
EDISON, WOLLSCHLAGER ([id number]) Visit Report for 08/11/2019 Arrival Information Details Patient Name: [name] [name], [name] Date of Service: 08/11/2019 8:45 AM Medical Record Number: [id number] Patient Account Number: [id number] Date of Birth/Sex: Aug 16, 1928 (86 y.o. F) Treating RN: Rodell Perna Primary Care Aurora Rody: Dewaine Oats Other Clinician: Referring Denijah Karrer: Dewaine Oats Treating Anea Fodera/Extender: Linwood Dibbles, HOYT Weeks in Treatment: 23 Visit Information History Since Last Visit Added or deleted any medications: No Patient Arrived: Wheel Chair Any new allergies or adverse reactions: No Arrival Time: 08:48 Had a fall or experienced change in No Accompanied By: grandson activities of daily living that may affect Transfer Assistance: Nurse, adult risk of falls: Patient Identification Verified: Yes Signs or symptoms of abuse/neglect since last visito No Secondary Verification Process Completed: Yes Hospitalized since last visit: No Implantable device outside of the clinic excluding No cellular tissue based products placed in the center since last visit: Has Dressing in Place as Prescribed: Yes Pain Present Now: No Electronic Signature(s) Signed: 08/17/2019 11:21:31 AM By: Elliot Gurney, BSN, RN, CWS, Kim RN, BSN Entered By: Elliot Gurney, BSN, RN, CWS, Kim on 08/11/2019 08:56:00 [name], [name] ([id number]) -------------------------------------------------------------------------------- Clinic Level of Care Assessment Details Patient Name: [name] Lye A. Date of Service: 08/11/2019 8:45 AM Medical Record Number: [id number] Patient Account Number: [id number] Date of Birth/Sex: 07-08-1928 (86 y.o. F) Treating RN: Rodell Perna Primary Care Issac Moure: Dewaine Oats Other Clinician: Referring Duff Pozzi: Dewaine Oats Treating Lovel Suazo/Extender: Linwood Dibbles, HOYT Weeks in Treatment: 23 Clinic Level of Care Assessment Items TOOL 4 Quantity Score []  - Use when only an EandM is performed on FOLLOW-UP visit 0 ASSESSMENTS -  Nursing Assessment / Reassessment X - Reassessment of Co-morbidities (includes updates in patient status) 1 10 X- 1 5 Reassessment of Adherence to Treatment Plan ASSESSMENTS - Wound and Skin Assessment / Reassessment X - Simple Wound Assessment / Reassessment - one wound 1 5 []  - 0 Complex Wound Assessment / Reassessment - multiple wounds []  - 0 Dermatologic / Skin Assessment (not related to wound area) ASSESSMENTS - Focused Assessment []  - Circumferential Edema Measurements - multi extremities 0 []  - 0 Nutritional Assessment / Counseling / Intervention []  - 0 Lower Extremity Assessment (monofilament, tuning fork, pulses) []  - 0 Peripheral Arterial Disease Assessment (using hand held doppler) ASSESSMENTS - Ostomy and/or Continence Assessment and Care []  - Incontinence Assessment and Management 0 []  - 0 Ostomy Care Assessment and Management (repouching, etc.) PROCESS - Coordination of Care X - Simple Patient / Family Education for ongoing care 1 15 []  - 0 Complex (extensive) Patient / Family Education for ongoing care X- 1 10 Staff obtains , Records, Test Results / Process Orders []  - 0 Staff telephones HHA, Nursing Homes / Clarify orders / etc []  - 0 Routine Transfer to another Facility (non-emergent condition) []  - 0 Routine Hospital Admission (non-emergent condition) []  - 0 New Admissions / / Ordering NPWT, Apligraf, etc. []  - 0 Emergency Hospital Admission (emergent condition) X- 1 10 Simple Discharge Coordination []  - 0 Complex (extensive) Discharge Coordination PROCESS - Special Needs []  - Pediatric / Minor Patient Management 0 []  - 0 Isolation Patient Management []  - 0 Hearing / Language / Visual special needs []  - 0 Assessment of Community assistance (transportation, D/C planning, etc.) Si, Turner A. ( ) []  - 0 Additional assistance / Altered mentation []  - 0 Support Surface(s) Assessment (bed, cushion, seat,  etc.) INTERVENTIONS - Wound Cleansing / Measurement X - Simple Wound Cleansing - one wound 1 5 []  - 0 Complex  Wound Cleansing - multiple wounds X- 1 5 Wound Imaging (photographs - any number of wounds) []  - 0 Wound Tracing (instead of photographs) X- 1 5 Simple Wound Measurement - one wound []  - 0 Complex Wound Measurement - multiple wounds INTERVENTIONS - Wound Dressings []  - Small Wound Dressing one or multiple wounds 0 X- 1 15 Medium Wound Dressing one or multiple wounds []  - 0 Large Wound Dressing one or multiple wounds []  - 0 Application of Medications - topical []  - 0 Application of Medications - injection INTERVENTIONS - Miscellaneous []  - External ear exam 0 []  - 0 Specimen Collection (cultures, biopsies, blood, body fluids, etc.) []  - 0 Specimen(s) / Culture(s) sent or taken to Lab for analysis []  - 0 Patient Transfer (multiple staff / Civil Service fast streamer / Similar devices) []  - 0 Simple Staple / Suture removal (25 or less) []  - 0 Complex Staple / Suture removal (26 or more) []  - 0 Hypo / Hyperglycemic Management (close monitor of Blood Glucose) []  - 0 Ankle / Brachial Index (ABI) - do not check if billed separately X- 1 5 Vital Signs Has the patient been seen at the hospital within the last three years: Yes Total Score: 90 Level Of Care: New/Established - Level 3 Electronic Signature(s) Signed: 08/11/2019 3:40:54 PM By: Army Melia Entered By: Army Melia on 08/11/2019 09:16:44 Malina, Cherrish A. ([id number]) -------------------------------------------------------------------------------- Encounter Discharge Information Details Patient Name: [name] Roan A. Date of Service: 08/11/2019 8:45 AM Medical Record Number: [id number] Patient Account Number: [id number] Date of Birth/Sex: September 05, 1928 (86 y.o. F) Treating RN: Army Melia Primary Care Albert Hersch: Benita Stabile Other Clinician: Referring Nico Rogness: Benita Stabile Treating Eldrick Penick/Extender: Melburn Hake, HOYT Weeks in  Treatment: 23 Encounter Discharge Information Items Discharge Condition: Stable Ambulatory Status: Wheelchair Discharge Destination: Skilled Nursing Facility Telephoned: No Orders Sent: Yes Transportation: Private Auto Accompanied By: family Schedule Follow-up Appointment: Yes Clinical Summary of Care: Electronic Signature(s) Signed: 08/11/2019 9:17:46 AM By: Army Melia Entered By: Army Melia on 08/11/2019 09:17:46 Ransome, Kourtnee A. ([id number]) -------------------------------------------------------------------------------- Lower Extremity Assessment Details Patient Name: [name] Roan A. Date of Service: 08/11/2019 8:45 AM Medical Record Number: [id number] Patient Account Number: [id number] Date of Birth/Sex: 03-Aug-1928 (86 y.o. F) Treating RN: Cornell Barman Primary Care Taunja Brickner: Benita Stabile Other Clinician: Referring Byrne Capek: Benita Stabile Treating Dior Dominik/Extender: Sharalyn Ink in Treatment: 23 Electronic Signature(s) Signed: 08/17/2019 11:21:31 AM By: Gretta Cool, BSN, RN, CWS, Kim RN, BSN Entered By: Gretta Cool, BSN, RN, CWS, Kim on 08/11/2019 09:04:26 Susa Griffins ([id number]) -------------------------------------------------------------------------------- Multi Wound Chart Details Patient Name: [name] Roan A. Date of Service: 08/11/2019 8:45 AM Medical Record Number: [id number] Patient Account Number: [id number] Date of Birth/Sex: 1928-10-04 (86 y.o. F) Treating RN: Army Melia Primary Care Tory Mckissack: Benita Stabile Other Clinician: Referring Joelys Staubs: TATE, Sharlet Salina Treating Hanley Rispoli/Extender: STONE III, HOYT Weeks in Treatment: 23 Vital Signs Height(in): 67 Pulse(bpm): 98 Weight(lbs): 140 Blood Pressure(mmHg): 133/69 Body Mass Index(BMI): 22 Temperature(F): 98.2 Respiratory Rate(breaths/min): 16 Photos: [N/A:N/A] Wound Location: Sacrum - Midline N/A N/A Wounding Event: Pressure Injury N/A N/A Primary Etiology: Pressure Ulcer N/A N/A Comorbid History: Glaucoma,  Hypertension, N/A N/A Osteoarthritis, Dementia Date Acquired: 07/18/2018 N/A N/A Weeks of Treatment: 23 N/A N/A Wound Status: Open N/A N/A Clustered Wound: Yes N/A N/A Clustered Quantity: 1 N/A N/A Measurements L x W x D (cm) 4x4x2.7 N/A N/A Area (cm) : 12.566 N/A N/A Volume (cm) : 33.929 N/A N/A % Reduction in Area: 27.30% N/A N/A % Reduction in Volume: -1863.50% N/A N/A Starting Position 1 (o'clock):  12 Ending Position 1 (o'clock): 6 Maximum Distance 1 (cm): 2 Undermining: Yes N/A N/A Classification: Category/Stage IV N/A N/A Exudate Amount: Medium N/A N/A Exudate Type: Serous N/A N/A Exudate Color: amber N/A N/A Wound Margin: Indistinct, nonvisible N/A N/A Granulation Amount: Large (67-100%) N/A N/A Granulation Quality: Red N/A N/A Necrotic Amount: Small (1-33%) N/A N/A Exposed Structures: Fat Layer (Subcutaneous Tissue) N/A N/A Exposed: Yes Muscle: Yes Fascia: No Tendon: No Joint: No Bone: No Epithelialization: Medium (34-66%) N/A N/A Treatment Notes VERDIE, WILMS A. ([id number]) Electronic Signature(s) Signed: 08/11/2019 9:15:16 AM By: Rodell Perna Entered By: Rodell Perna on 08/11/2019 09:15:15 [name] Lye A. ([id number]) -------------------------------------------------------------------------------- Multi-Disciplinary Care Plan Details Patient Name: [name] Lye A. Date of Service: 08/11/2019 8:45 AM Medical Record Number: [id number] Patient Account Number: [id number] Date of Birth/Sex: 20-Dec-1928 (86 y.o. F) Treating RN: Rodell Perna Primary Care Toshua Honsinger: Dewaine Oats Other Clinician: Referring Grayden Burley: Dewaine Oats Treating Ura Hausen/Extender: Linwood Dibbles, HOYT Weeks in Treatment: 23 Active Inactive Abuse / Safety / Falls / Self Care Management Nursing Diagnoses: Potential for falls Goals: Patient will remain injury free related to falls Date Initiated: 03/03/2019 Target Resolution Date: 03/16/2019 Goal Status: Active Interventions: Assess fall risk on  admission and as needed Notes: Medication Nursing Diagnoses: Knowledge deficit related to medication safety: actual or potential Goals: Patient/caregiver will demonstrate understanding of all current medications Date Initiated: 03/03/2019 Target Resolution Date: 03/16/2019 Goal Status: Active Interventions: Assess for medication contraindications each visit where new medications are prescribed Treatment Activities: New medication prescribed at Wound Center : 03/03/2019 Notes: Necrotic Tissue Nursing Diagnoses: Impaired tissue integrity related to necrotic/devitalized tissue Goals: Necrotic/devitalized tissue will be minimized in the wound bed Date Initiated: 03/03/2019 Target Resolution Date: 03/16/2019 Goal Status: Active Interventions: Assess patient pain level pre-, during and post procedure and prior to discharge Treatment Activities: Apply topical anesthetic as ordered : 03/03/2019 Notes: VIHA, KRIEGEL ([id number]) Nutrition Nursing Diagnoses: Potential for alteratiion in Nutrition/Potential for imbalanced nutrition Goals: Patient/caregiver agrees to and verbalizes understanding of need to use nutritional supplements and/or vitamins as prescribed Date Initiated: 03/03/2019 Target Resolution Date: 03/16/2019 Goal Status: Active Interventions: Provide education on nutrition Notes: Pressure Nursing Diagnoses: Knowledge deficit related to management of pressures ulcers Goals: Patient/caregiver will verbalize understanding of pressure ulcer management Date Initiated: 03/03/2019 Target Resolution Date: 03/16/2019 Goal Status: Active Interventions: Assess: immobility, friction, shearing, incontinence upon admission and as needed Provide education on pressure ulcers Treatment Activities: Patient referred for home evaluation of offloading devices/mattresses : 03/03/2019 Patient referred for pressure reduction/relief devices : 03/03/2019 Pressure reduction/relief  device ordered : 03/03/2019 Notes: Wound/Skin Impairment Nursing Diagnoses: Impaired tissue integrity Goals: Ulcer/skin breakdown will have a volume reduction of 30% by week 4 Date Initiated: 03/03/2019 Target Resolution Date: 03/03/2019 Goal Status: Active Interventions: Assess ulceration(s) every visit Treatment Activities: Skin care regimen initiated : 03/03/2019 Notes: Electronic Signature(s) Signed: 08/11/2019 9:15:08 AM By: Rodell Perna Entered By: Rodell Perna on 08/11/2019 09:15:07 Patrie, Bathsheba A. ([id number]) -------------------------------------------------------------------------------- Pain Assessment Details Patient Name: [name] Lye A. Date of Service: 08/11/2019 8:45 AM Medical Record Number: [id number] Patient Account Number: [id number] Date of Birth/Sex: 08/01/1928 (86 y.o. F) Treating RN: Huel Coventry Primary Care Lillie Portner: Dewaine Oats Other Clinician: Referring Kadon Andrus: Dewaine Oats Treating Rise Traeger/Extender: Linwood Dibbles, HOYT Weeks in Treatment: 23 Active Problems Location of Pain Severity and Description of Pain Patient Has Paino No Site Locations Pain Management and Medication Current Pain Management: Electronic Signature(s) Signed: 08/17/2019 11:21:31 AM By: Elliot Gurney, BSN, RN, CWS, Kim RN, BSN Entered By: Elliot Gurney, BSN,  RN, CWS, Kim on 08/11/2019 08:57:04 Kirti, Carl [name] ([id number]) -------------------------------------------------------------------------------- Patient/Caregiver Education Details Patient Name: SUMAIYA, ARRUDA A. Date of Service: 08/11/2019 8:45 AM Medical Record Number: [id number] Patient Account Number: [id number] Date of Birth/Gender: 09-Jul-1928 (86 y.o. F) Treating RN: Rodell Perna Primary Care Physician: Dewaine Oats Other Clinician: Referring Physician: Dewaine Oats Treating Physician/Extender: Skeet Simmer in Treatment: 23 Education Assessment Education Provided To: Patient Education Topics Provided Wound/Skin Impairment: Handouts:  Caring for Your Ulcer Methods: Demonstration, Explain/Verbal Responses: State content correctly Electronic Signature(s) Signed: 08/11/2019 3:40:54 PM By: Rodell Perna Entered By: Rodell Perna on 08/11/2019 09:17:00 Metzer, Khloi A. ([id number]) -------------------------------------------------------------------------------- Wound Assessment Details Patient Name: [name] Lye A. Date of Service: 08/11/2019 8:45 AM Medical Record Number: [id number] Patient Account Number: [id number] Date of Birth/Sex: 10-06-1928 (86 y.o. F) Treating RN: Huel Coventry Primary Care Maurissa Ambrose: Dewaine Oats Other Clinician: Referring Kilan Banfill: Dewaine Oats Treating Yuriko Portales/Extender: Linwood Dibbles, HOYT Weeks in Treatment: 23 Wound Status Wound Number: 4 Primary Etiology: Pressure Ulcer Wound Location: Sacrum - Midline Wound Status: Open Wounding Event: Pressure Injury Comorbid History: Glaucoma, Hypertension, Osteoarthritis, Dementia Date Acquired: 07/18/2018 Weeks Of Treatment: 23 Clustered Wound: Yes Photos Wound Measurements Length: (cm) 4 % Width: (cm) 4 % Depth: (cm) 2.7 Ep Clustered Quantity: 1 Un Area: (cm) 12.566 Volume: (cm) 33.929 Reduction in Area: 27.3% Reduction in Volume: -1863.5% ithelialization: Medium (34-66%) dermining: Yes Starting Position (o'clock): 12 Ending Position (o'clock): 6 Maximum Distance: (cm) 2 Wound Description Classification: Category/Stage IV F Wound Margin: Indistinct, nonvisible S Exudate Amount: Medium Exudate Type: Serous Exudate Color: amber oul Odor After Cleansing: No lough/Fibrino Yes Wound Bed Granulation Amount: Large (67-100%) Exposed Structure Granulation Quality: Red Fascia Exposed: No Necrotic Amount: Small (1-33%) Fat Layer (Subcutaneous Tissue) Exposed: Yes Necrotic Quality: Adherent Slough Tendon Exposed: No Muscle Exposed: Yes Necrosis of Muscle: No Joint Exposed: No Bone Exposed: No Treatment Notes Wound #4 (Midline Sacrum) Notes Sayer,  Trenee A. ([id number]) Dakins gauze with ABD and tape to midline sacrum Electronic Signature(s) Signed: 08/17/2019 11:21:31 AM By: Elliot Gurney, BSN, RN, CWS, Kim RN, BSN Entered By: Elliot Gurney, BSN, RN, CWS, Kim on 08/11/2019 09:03:23 [name] ([id number]) -------------------------------------------------------------------------------- Vitals Details Patient Name: [name] Lye A. Date of Service: 08/11/2019 8:45 AM Medical Record Number: [id number] Patient Account Number: [id number] Date of Birth/Sex: [birth year] (86 y.o. F) Treating RN: Huel Coventry Primary Care Bryne Lindon: Dewaine Oats Other Clinician: Referring Braxley Balandran: Dewaine Oats Treating Jurell Basista/Extender: Linwood Dibbles, HOYT Weeks in Treatment: 23 Vital Signs Time Taken: 08:56 Temperature (F): 98.2 Height (in): 67 Pulse (bpm): 98 Weight (lbs): 140 Respiratory Rate (breaths/min): 16 Body Mass Index (BMI): 21.9 Blood Pressure (mmHg): 133/69 Reference Range: 80 - 120 mg / dl Electronic Signature(s) Signed: 08/17/2019 11:21:31 AM By: Elliot Gurney, BSN, RN, CWS, Kim RN, BSN Entered By: Elliot Gurney, BSN, RN, CWS, Kim on 08/11/2019 08:56:25

## 2019-08-18 ENCOUNTER — Encounter: Payer: Medicare Other | Attending: Physician Assistant | Admitting: Physician Assistant

## 2019-08-18 ENCOUNTER — Other Ambulatory Visit: Payer: Self-pay

## 2019-08-18 DIAGNOSIS — L89153 Pressure ulcer of sacral region, stage 3: Secondary | ICD-10-CM | POA: Insufficient documentation

## 2019-08-18 DIAGNOSIS — I10 Essential (primary) hypertension: Secondary | ICD-10-CM | POA: Insufficient documentation

## 2019-08-18 DIAGNOSIS — L89224 Pressure ulcer of left hip, stage 4: Secondary | ICD-10-CM | POA: Diagnosis not present

## 2019-08-18 DIAGNOSIS — L89623 Pressure ulcer of left heel, stage 3: Secondary | ICD-10-CM | POA: Insufficient documentation

## 2019-08-18 DIAGNOSIS — L89513 Pressure ulcer of right ankle, stage 3: Secondary | ICD-10-CM | POA: Diagnosis not present

## 2019-08-18 NOTE — Progress Notes (Addendum)
EVELINE, SAUVE ([id number]) Visit Report for 08/18/2019 Chief Complaint Document Details Patient Name: BRANDACE, CARGLE A. Date of Service: 08/18/2019 11:00 AM Medical Record Number: [id number] Patient Account Number: [id number] Date of Birth/Sex: July 05, 1928 (86 y.o. F) Treating RN: Rodell Perna Primary Care Provider: Dewaine Oats Other Clinician: Referring Provider: Dewaine Oats Treating Provider/Extender: Linwood Dibbles, Mister Krahenbuhl Weeks in Treatment: 24 Information Obtained from: Patient Chief Complaint Multiple pressure ulcers Electronic Signature(s) Signed: 08/18/2019 11:29:24 AM By: Lenda Kelp PA-C Entered By: Lenda Kelp on 08/18/2019 11:29:23 [name] Lye A. ([id number]) -------------------------------------------------------------------------------- HPI Details Patient Name: [name] Lye A. Date of Service: 08/18/2019 11:00 AM Medical Record Number: [id number] Patient Account Number: [id number] Date of Birth/Sex: Jul 28, 1928 (86 y.o. F) Treating RN: Rodell Perna Primary Care Provider: Dewaine Oats Other Clinician: Referring Provider: Dewaine Oats Treating Provider/Extender: Linwood Dibbles, Trystan Akhtar Weeks in Treatment: 24 History of Present Illness HPI Description: 03/03/2019 on evaluation today patient presents for initial evaluation here in the clinic today concerning issues that she has been having with wounds which are pressure in nature at multiple locations currently. This is on her left heel, right ankle, left hip/ischial location, and all her to varying degrees of severity and depth. The worst is on the left ischial location. At this time the patient has been tolerating dressing changes although I am not exactly sure the dressings that have been utilized prior to coming in today. She does have some necrotic tissue noted at several locations this can require at least some degree of cleaning prior to application of dressings going forward. The patient does have dementia and unfortunately in recent months  has become increasingly immobile requiring more significant treatment she was just recently moved roughly 2 weeks ago from assisted living to a skilled nursing facility at [facility]. Based on what I am seeing currently these pressure injuries occurred prior to that 2-week time I do not see any new or obvious pressure injury at this point. I discussed with the patient as well as her family member that this does appear to be doing better in my opinion than likely where things started. They are unsure as to whether or not she has an air mattress at the facility I do think that would be something that would be appropriate and good for her to have but at the moment I do not know for sure whether that is already in place if not that definitely is can be 1 of the recommendations. Also think a Roho cushion for her wheelchair would be good as well as Prevalon offloading boots. The patient currently does not have any severe pain although when I was cleaning some of the areas she did note there was some discomfort at several of the locations. She does have a history of hypertension as well. 10/29; this is a patient from [facility] nursing home. She is nonambulatory. She is here for follow-up on 4 different wounds 1 over the left lateral malleolus, the right medial calcaneus, the left posterior hip and I believe a new area over the sacrum. The patient is nonambulatory but apparently eats well. 03/24/2019 on evaluation today patient actually appears to be doing better with regard to her wounds in general based on what I am seeing. Fortunately there is no signs of active infection at this time. No fever chills noted. Overall been very pleased with the progress that seems to be occurring since I last saw her. There is a little bit of debridement That will need to be performed  today. 04/07/2019 on evaluation today patient actually appears to be doing well in some regards based on what I am seeing today.  Fortunately there is no signs of active infection at this time which is good news. No fever chills noted. With that being said the wounds on her foot and ankle region in general have healed at this point. I do not see anything open in that region. With regard to the other 2 wounds in the left ischium and midline sacral region I believe that we may need to make a dressing change at this point to do something a little bit more conducive to cleaning up the wound bed. The patient is in agreement with that plan today. 04/21/2019 on evaluation today patient actually appears to be doing well with regard to her wounds all things considered. Both appear to be much cleaner than what they were previous. Fortunately there is no signs of active infection at this time. Both I think are doing well with the current wound care measures which is the Dakin moistened gauze dressings. 05/05/2019 on evaluation today patient seems to be doing much better in regard to her ischial ulcer unfortunately she still seems to be getting pressure to the sacral region. I think that this is evidenced by the fact that she does have some surrounding deep tissue injury and the fact that the wound is actually measuring larger not better. Fortunately there is no evidence of active infection at this time I do believe the Dakin's is still the best option treatment wise but again I do think the patient needs more appropriate offloading on the sacral region. 06/24/2019 on evaluation today patient appears to be doing well with regard to the wound over the ischium which appears to be completely healed. This is great news. Fortunately there does appear to be some improvement with regard to the sacral wound as well. There still does seem to be evidence of necrotic tissue noted minimally that I was able to gently clean away. With that being said the wound itself seems to be doing quite well which is good news. She seems to be offloaded appropriately in  my opinion at this point. I think she may be ready for a wound VAC. 07/14/2019 on evaluation today patient appears to be doing well with regard to her sacral wound in general although unfortunately she does appear to have some evidence of potential infection there is some odor that seems to be a little stronger than just what would normally be noted with the wound VAC. With that being said I do think we need to address this and though I think the wound VAC is appropriate to continue I think that we do need to see about a culture as well as placing her on some antibiotic therapy at this point. The patient's granddaughter was present during evaluation today as well. 07/28/2019 upon evaluation today patient unfortunately appears to not be doing quite as well as I would like with regard to the wound VAC and really was not applied appropriately. The phone was applied directly to the skin which did cause a little bit of breakdown around the opening of the wound. Subsequently is also unfortunately the case that the foam that was down at the sacral region and that was tracked up and along her back was not actually connected so they touched this obviously inhibits the ability to be able to properly drain fluid from 1 to the other the have to be continuous and touching. With  that being said there was some improvement today in the overall appearance of the wound bed though obviously size wise not much difference today. We are going to get in touch with the facility to try to make sure that they know what they are doing with applying the wound VAC I am also can likely see her for more frequent follow-up for the time being. All this was discussed with the patient's grandson during the office visit today as well. 08/11/19 upon evaluation today patient appears to be doing poorly in regard to her wound at this time. Unfortunately there appears to be infection at this time including blue-green drainage and a poor quality  to the granulation tissue all of which point toward infection she also seems to be much more Brucker, Mayzee A. ([id number]) painful than we noticed in the past. She is moving around a lot just with very light touch around the area I think that is an indication of discomfort 08/18/2019 upon evaluation today patient appears to be doing excellent in regard to her wound as compared to last evaluation. There is still some odor but she does not have as much of the necrotic tissue nor the significant drainage that I noted at the last visit. There still does appear to be evidence of infection and she still is much more tender than what she was experiencing prior to the infection noted last week. 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 I do believe that she likely is getting need to see infectious disease due to the ongoing nature of this infection. For now would you still have to hold off on reinitiating the wound VAC. Electronic Signature(s) Signed: 08/18/2019 1:06:37 PM By: Lenda Kelp PA-C Entered By: Lenda Kelp on 08/18/2019 13:06:37 [name] ([id number]) -------------------------------------------------------------------------------- Physical Exam Details Patient Name: [name] Lye A. Date of Service: 08/18/2019 11:00 AM Medical Record Number: [id number] Patient Account Number: [id number] Date of Birth/Sex: 1929-03-24 (86 y.o. F) Treating RN: Rodell Perna Primary Care Provider: Dewaine Oats Other Clinician: Referring Provider: TATE, Katherina Right Treating Provider/Extender: STONE III, Maalle Starrett Weeks in Treatment: 24 Constitutional Well-nourished and well-hydrated in no acute distress. Respiratory normal breathing without difficulty. Psychiatric this patient is able to make decisions and demonstrates good insight into disease process. Alert and Oriented x 3. pleasant and cooperative. Notes Upon inspection patient's wound does show healthier  tissue but she still does have purulent drainage noted there is still some odor and she still has increased pain compared to where she was previous to the infection. I think that she really needs likely evaluation with infectious disease and possible IV antibiotic therapy. Electronic Signature(s) Signed: 08/18/2019 1:07:08 PM By: Lenda Kelp PA-C Entered By: Lenda Kelp on 08/18/2019 13:07:07 [name] ([id number]) -------------------------------------------------------------------------------- Physician Orders Details Patient Name: [name] Lye A. Date of Service: 08/18/2019 11:00 AM Medical Record Number: [id number] Patient Account Number: [id number] Date of Birth/Sex: [birth year] (86 y.o. F) Treating RN: Rodell Perna Primary Care Provider: Dewaine Oats Other Clinician: Referring Provider: Dewaine Oats Treating Provider/Extender: Linwood Dibbles, Keishawna Carranza Weeks in Treatment: 24 Verbal / Phone Orders: No Diagnosis Coding ICD-10 Coding Code Description L89.623 Pressure ulcer of left heel, stage 3 L89.513 Pressure ulcer of right ankle, stage 3 L89.224 Pressure ulcer of left hip, stage 4 L89.153 Pressure ulcer of sacral region, stage 3 I10 Essential (primary) hypertension F01.50 Vascular dementia without behavioral disturbance Wound Cleansing Wound #4 Midline Sacrum o Clean wound  with Normal Saline. o Cleanse wound with mild soap and water Primary Wound Dressing Wound #4 Midline Sacrum o Other: - Dakins moistened gauze packed Secondary Dressing Wound #4 Midline Sacrum o ABD pad - secure with tape Dressing Change Frequency Wound #4 Midline Sacrum o Change dressing every day. Follow-up Appointments Wound #4 Midline Sacrum o Return Appointment in 1 week. Off-Loading Wound #4 Midline Sacrum o Roho cushion for wheelchair - pt needs Roho cushion for her chair o Mattress - Pt needs Air mattress o Turn and reposition every 2 hours - Pt is still getting pressure to her  midline sacrum. Please reposition pt EVERY 2 HOURS Negative Pressure Wound Therapy Wound #4 Midline Sacrum o Place NPWT on HOLD. 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 ([id number]) Entered By: Rodell Perna on 08/18/2019 11:37:45 Mcconaughy, Swayzie A. ([id number]) -------------------------------------------------------------------------------- Problem List Details Patient Name: [name] Lye A. Date of Service: 08/18/2019 11:00 AM Medical Record Number: [id number] Patient Account Number: [id number] Date of Birth/Sex: March 20, 1929 (86 y.o. F) Treating RN: Rodell Perna Primary Care Provider: Dewaine Oats Other Clinician: Referring Provider: Dewaine Oats Treating Provider/Extender: Linwood Dibbles, Rustin Erhart Weeks in Treatment: 24 Active Problems ICD-10 Evaluated Encounter Code Description Active Date Today Diagnosis L89.623 Pressure ulcer of left heel, stage 3 03/03/2019 No Yes L89.513 Pressure ulcer of right ankle, stage 3 03/03/2019 No Yes L89.224 Pressure ulcer of left hip, stage 4 03/03/2019 No Yes L89.153 Pressure ulcer of sacral region, stage 3 03/24/2019 No Yes I10 Essential (primary) hypertension 03/03/2019 No Yes F01.50 Vascular dementia without behavioral disturbance 03/03/2019 No Yes Inactive Problems Resolved Problems Electronic Signature(s) Signed: 08/18/2019 11:29:11 AM By: Lenda Kelp PA-C Entered By: Lenda Kelp on 08/18/2019 11:29:11 [name] Lye A. ([id number]) -------------------------------------------------------------------------------- Progress Note Details Patient Name: [name] Lye A. Date of Service: 08/18/2019 11:00 AM Medical Record Number: [id number] Patient Account Number: [id number] Date of Birth/Sex: 04-09-1929  (86 y.o. F) Treating RN: Rodell Perna Primary Care Provider: Dewaine Oats Other Clinician: Referring Provider: Dewaine Oats Treating Provider/Extender: Linwood Dibbles, Scottlynn Lindell Weeks in Treatment: 24 Subjective Chief Complaint Information obtained from Patient Multiple pressure ulcers History of Present Illness (HPI) 03/03/2019 on evaluation today patient presents for initial evaluation here in the clinic today concerning issues that she has been having with wounds which are pressure in nature at multiple locations currently. This is on her left heel, right ankle, left hip/ischial location, and all her to varying degrees of severity and depth. The worst is on the left ischial location. At this time the patient has been tolerating dressing changes although I am not exactly sure the dressings that have been utilized prior to coming in today. She does have some necrotic tissue noted at several locations this can require at least some degree of cleaning prior to application of dressings going forward. The patient does have dementia and unfortunately in recent months has become increasingly immobile requiring more significant treatment she was just recently moved roughly 2 weeks ago from assisted living to a skilled nursing facility at [facility]. Based on what I am seeing currently these pressure injuries occurred prior to that 2-week time I do not see any new or obvious pressure injury at this point. I discussed with the patient as well as her family member that this does  appear to be doing better in my opinion than likely where things started. They are unsure as to whether or not she has an air mattress at the facility I do think that would be something that would be appropriate and good for her to have but at the moment I do not know for sure whether that is already in place if not that definitely is can be 1 of the recommendations. Also think a Roho cushion for her wheelchair would be good as well as  Prevalon offloading boots. The patient currently does not have any severe pain although when I was cleaning some of the areas she did note there was some discomfort at several of the locations. She does have a history of hypertension as well. 10/29; this is a patient from [place] [facility]. She is nonambulatory. She is here for follow-up on 4 different wounds 1 over the left lateral malleolus, the right medial calcaneus, the left posterior hip and I believe a new area over the sacrum. The patient is nonambulatory but apparently eats well. 03/24/2019 on evaluation today patient actually appears to be doing better with regard to her wounds in general based on what I am seeing. Fortunately there is no signs of active infection at this time. No fever chills noted. Overall been very pleased with the progress that seems to be occurring since I last saw her. There is a little bit of debridement That will need to be performed today. 04/07/2019 on evaluation today patient actually appears to be doing well in some regards based on what I am seeing today. Fortunately there is no signs of active infection at this time which is good news. No fever chills noted. With that being said the wounds on her foot and ankle region in general have healed at this point. I do not see anything open in that region. With regard to the other 2 wounds in the left ischium and midline sacral region I believe that we may need to make a dressing change at this point to do something a little bit more conducive to cleaning up the wound bed. The patient is in agreement with that plan today. 04/21/2019 on evaluation today patient actually appears to be doing well with regard to her wounds all things considered. Both appear to be much cleaner than what they were previous. Fortunately there is no signs of active infection at this time. Both I think are doing well with the current wound care measures which is the Dakin moistened  gauze dressings. 05/05/2019 on evaluation today patient seems to be doing much better in regard to her ischial ulcer unfortunately she still seems to be getting pressure to the sacral region. I think that this is evidenced by the fact that she does have some surrounding deep tissue injury and the fact that the wound is actually measuring larger not better. Fortunately there is no evidence of active infection at this time I do believe the Dakin's is still the best option treatment wise but again I do think the patient needs more appropriate offloading on the sacral region. 06/24/2019 on evaluation today patient appears to be doing well with regard to the wound over the ischium which appears to be completely healed. This is great news. Fortunately there does appear to be some improvement with regard to the sacral wound as well. There still does seem to be evidence of necrotic tissue noted minimally that I was able to gently clean away. With that being said the  wound itself seems to be doing quite well which is good news. She seems to be offloaded appropriately in my opinion at this point. I think she may be ready for a wound VAC. 07/14/2019 on evaluation today patient appears to be doing well with regard to her sacral wound in general although unfortunately she does appear to have some evidence of potential infection there is some odor that seems to be a little stronger than just what would normally be noted with the wound VAC. With that being said I do think we need to address this and though I think the wound VAC is appropriate to continue I think that we do need to see about a culture as well as placing her on some antibiotic therapy at this point. The patient's granddaughter was present during evaluation today as well. 07/28/2019 upon evaluation today patient unfortunately appears to not be doing quite as well as I would like with regard to the wound VAC and really was not applied appropriately. The  phone was applied directly to the skin which did cause a little bit of breakdown around the opening of the wound. Subsequently is also unfortunately the case that the foam that was down at the sacral region and that was tracked up and along her back was not actually connected so they touched this obviously inhibits the ability to be able to properly drain fluid from 1 to the other the have to be continuous and touching. With that being said there was some improvement today in the overall appearance of the wound bed though obviously size wise not much difference today. We are going to get in touch with the facility to try to make sure that they know what they are doing with applying the wound VAC I am Pals, [facility] A. ([id number]) also can likely see her for more frequent follow-up for the time being. All this was discussed with the patient's grandson during the office visit today as well. 08/11/19 upon evaluation today patient appears to be doing poorly in regard to her wound at this time. Unfortunately there appears to be infection at this time including blue-green drainage and a poor quality to the granulation tissue all of which point toward infection she also seems to be much more painful than we noticed in the past. She is moving around a lot just with very light touch around the area I think that is an indication of discomfort 08/18/2019 upon evaluation today patient appears to be doing excellent in regard to her wound as compared to last evaluation. There is still some odor but she does not have as much of the necrotic tissue nor the significant drainage that I noted at the last visit. There still does appear to be evidence of infection and she still is much more tender than what she was experiencing prior to the infection noted last week. 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 I do believe that she likely is getting need to see  infectious disease due to the ongoing nature of this infection. For now would you still have to hold off on reinitiating the wound VAC. Objective Constitutional Well-nourished and well-hydrated in no acute distress. Vitals Time Taken: 11:10 AM, Height: 67 in, Weight: 140 lbs, BMI: 21.9, Temperature: 97.8 F, Pulse: 99 bpm, Respiratory Rate: 16 breaths/min, Blood Pressure: 154/80 mmHg. Respiratory normal breathing without difficulty. Psychiatric this patient is able to make decisions and demonstrates good insight into  disease process. Alert and Oriented x 3. pleasant and cooperative. General Notes: Upon inspection patient's wound does show healthier tissue but she still does have purulent drainage noted there is still some odor and she still has increased pain compared to where she was previous to the infection. I think that she really needs likely evaluation with infectious disease and possible IV antibiotic therapy. Integumentary (Hair, Skin) Wound #4 status is Open. Original cause of wound was Pressure Injury. The wound is located on the Midline Sacrum. The wound measures 3.5cm length x 3.2cm width x 2.3cm depth; 8.796cm^2 area and 20.232cm^3 volume. There is muscle and Fat Layer (Subcutaneous Tissue) Exposed exposed. There is no tunneling noted, however, there is undermining starting at 12:00 and ending at 6:00 with a maximum distance of 2.8cm. There is a medium amount of serous drainage noted. The wound margin is indistinct and nonvisible. There is large (67-100%) red granulation within the wound bed. There is a small (1-33%) amount of necrotic tissue within the wound bed including Adherent Slough. Assessment Active Problems ICD-10 Pressure ulcer of left heel, stage 3 Pressure ulcer of right ankle, stage 3 Pressure ulcer of left hip, stage 4 Pressure ulcer of sacral region, stage 3 Essential (primary) hypertension Vascular dementia without behavioral disturbance Plan [name] [name], [name]  ([id number]) Wound Cleansing: Wound #4 Midline Sacrum: Clean wound with Normal Saline. Cleanse wound with mild soap and water Primary Wound Dressing: Wound #4 Midline Sacrum: Other: - Dakins moistened gauze packed Secondary Dressing: Wound #4 Midline Sacrum: ABD pad - secure with tape Dressing Change Frequency: Wound #4 Midline Sacrum: Change dressing every day. Follow-up Appointments: Wound #4 Midline Sacrum: Return Appointment in 1 week. Off-Loading: Wound #4 Midline Sacrum: Roho cushion for wheelchair - pt needs Roho cushion for her chair Mattress - Pt needs Air mattress Turn and reposition every 2 hours - Pt is still getting pressure to her midline sacrum. Please reposition pt EVERY 2 HOURS Negative Pressure Wound Therapy: Wound #4 Midline Sacrum: Place NPWT on HOLD. 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 I would recommend currently that we go ahead and initiate treatment with continuation of the Dakin's moistened gauze dressing to the sacral region. That seems to be helping at this point to clean up the area. 2. I am also can recommend that we hold the wound VAC still at this point. 3. With regard to the infection due to the resistant E. 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 I did discuss this with the patient's family member during the office today as well and we will also contact the facility and let them know we placed the order as well and the order sent back to the facility. 4. I am get a recommend as well that the patient continue with appropriate offloading to prevent any further breakdown in regard to the sacral region. We will see patient back for reevaluation in 1 week here in the clinic. If anything  worsens or changes patient will contact our office for additional recommendations. Electronic Signature(s) Signed: 08/18/2019 1:08:12 PM By: Lenda KelpStone III, Gregory Barrick PA-C Entered By: Lenda KelpStone III, Daxtin Leiker on 08/18/2019 13:08:11 [name] [name], [name] IshiharaWILMA A. ([id number]) -------------------------------------------------------------------------------- SuperBill Details Patient Name: [name] LyeHALL, [name] Date of Service: 08/18/2019 Medical Record Number: [id number] Patient Account Number: [id number] Date  of Birth/Sex: [birth year] (86 y.o. F) Treating RN: Rodell Perna Primary Care Provider: Dewaine Oats Other Clinician: Referring Provider: Dewaine Oats Treating Provider/Extender: Linwood Dibbles, Kortlynn Poust Weeks in Treatment: 24 Diagnosis Coding ICD-10 Codes Code Description [phone] Pressure ulcer of left heel, stage 3 L89.513 Pressure ulcer of right ankle, stage 3 L89.224 Pressure ulcer of left hip, stage 4 L89.153 Pressure ulcer of sacral region, stage 3 I10 Essential (primary) hypertension F01.50 Vascular dementia without behavioral disturbance Facility Procedures CPT4 Code: [id number] Description: 99213 - WOUND CARE VISIT-LEV 3 EST PT Modifier: Quantity: 1 Physician Procedures CPT4 Code: [id number] Description: 99214 - WC PHYS LEVEL 4 - EST PT Modifier: Quantity: 1 CPT4 Code: Description: ICD-10 Diagnosis Description L89.623 Pressure ulcer of left heel, stage 3 L89.513 Pressure ulcer of right ankle, stage 3 L89.224 Pressure ulcer of left hip, stage 4 L89.153 Pressure ulcer of sacral region, stage 3 Modifier: Quantity: Electronic Signature(s) Signed: 08/18/2019 1:09:36 PM By: Lenda Kelp PA-C Entered By: Lenda Kelp on 08/18/2019 13:09:35

## 2019-08-18 NOTE — Progress Notes (Signed)
[name], [name] ([id number]) Visit Report for 08/18/2019 Arrival Information Details Patient Name: [name], [name] A. Date of Service: 08/18/2019 11:00 AM Medical Record Number: [id number] Patient Account Number: [id number] Date of Birth/Sex: Oct 19, 1928 (86 y.o. F) Treating RN: [name] [name] Primary Care [name] [name]: Dewaine Oats Other Clinician: Referring [name] [name]: Dewaine Oats Treating [name] [name]/Extender: [name] [name], [name] [name] Weeks in Treatment: 24 Visit Information History Since Last Visit Added or deleted any medications: No Patient Arrived: Wheel Chair Any new allergies or adverse reactions: No Arrival Time: 11:09 Had a fall or experienced change in No Accompanied By: granddaughter activities of daily living that may affect Transfer Assistance: Nurse, adult risk of falls: Patient Identification Verified: Yes Signs or symptoms of abuse/neglect since last visito No Secondary Verification Process Completed: Yes Hospitalized since last visit: No Implantable device outside of the clinic excluding No cellular tissue based products placed in the center since last visit: Has Dressing in Place as Prescribed: Yes Pain Present Now: Unable to Respond Electronic Signature(s) Signed: 08/18/2019 4:50:17 PM By: [name] [name] Entered By: [name] [name] on 08/18/2019 11:09:58 [name] [name] ([id number]) -------------------------------------------------------------------------------- Clinic Level of Care Assessment Details Patient Name: [name] [name] A. Date of Service: 08/18/2019 11:00 AM Medical Record Number: [id number] Patient Account Number: [id number] Date of Birth/Sex: 01-Jan-1929 (87 y.o. F) Treating RN: [name] [name] Primary Care Jalani Rominger: Dewaine Oats Other Clinician: Referring Andyn Sales: Dewaine Oats Treating [name] [name]/Extender: [name] [name], [name] [name] Weeks in Treatment: 24 Clinic Level of Care Assessment Items TOOL 4 Quantity Score []  - Use when only an EandM is performed on FOLLOW-UP visit 0 ASSESSMENTS - Nursing  Assessment / Reassessment X - Reassessment of Co-morbidities (includes updates in patient status) 1 10 X- 1 5 Reassessment of Adherence to Treatment Plan ASSESSMENTS - Wound and Skin Assessment / Reassessment X - Simple Wound Assessment / Reassessment - one wound 1 5 []  - 0 Complex Wound Assessment / Reassessment - multiple wounds []  - 0 Dermatologic / Skin Assessment (not related to wound area) ASSESSMENTS - Focused Assessment []  - Circumferential Edema Measurements - multi extremities 0 []  - 0 Nutritional Assessment / Counseling / Intervention []  - 0 Lower Extremity Assessment (monofilament, tuning fork, pulses) []  - 0 Peripheral Arterial Disease Assessment (using hand held doppler) ASSESSMENTS - Ostomy and/or Continence Assessment and Care []  - Incontinence Assessment and Management 0 []  - 0 Ostomy Care Assessment and Management (repouching, etc.) PROCESS - Coordination of Care X - Simple Patient / Family Education for ongoing care 1 15 []  - 0 Complex (extensive) Patient / Family Education for ongoing care []  - 0 Staff obtains , Records, Test Results / Process Orders []  - 0 Staff telephones HHA, Nursing Homes / Clarify orders / etc []  - 0 Routine Transfer to another Facility (non-emergent condition) []  - 0 Routine Hospital Admission (non-emergent condition) []  - 0 New Admissions / / Ordering NPWT, Apligraf, etc. []  - 0 Emergency Hospital Admission (emergent condition) X- 1 10 Simple Discharge Coordination []  - 0 Complex (extensive) Discharge Coordination PROCESS - Special Needs []  - Pediatric / Minor Patient Management 0 []  - 0 Isolation Patient Management []  - 0 Hearing / Language / Visual special needs []  - 0 Assessment of Community assistance (transportation, D/C planning, etc.) [name] [name], [name] ( ) []  - 0 Additional assistance / Altered mentation []  - 0 Support Surface(s) Assessment (bed, cushion, seat,  etc.) INTERVENTIONS - Wound Cleansing / Measurement X - Simple Wound Cleansing - one wound 1 5 []  - 0 Complex Wound Cleansing - multiple wounds X-  1 5 Wound Imaging (photographs - any number of wounds) []  - 0 Wound Tracing (instead of photographs) X- 1 5 Simple Wound Measurement - one wound []  - 0 Complex Wound Measurement - multiple wounds INTERVENTIONS - Wound Dressings []  - Small Wound Dressing one or multiple wounds 0 X- 1 15 Medium Wound Dressing one or multiple wounds []  - 0 Large Wound Dressing one or multiple wounds []  - 0 Application of Medications - topical []  - 0 Application of Medications - injection INTERVENTIONS - Miscellaneous []  - External ear exam 0 []  - 0 Specimen Collection (cultures, biopsies, blood, body fluids, etc.) []  - 0 Specimen(s) / Culture(s) sent or taken to Lab for analysis []  - 0 Patient Transfer (multiple staff / Civil Service fast streamer / Similar devices) []  - 0 Simple Staple / Suture removal (25 or less) []  - 0 Complex Staple / Suture removal (26 or more) []  - 0 Hypo / Hyperglycemic Management (close monitor of Blood Glucose) []  - 0 Ankle / Brachial Index (ABI) - do not check if billed separately X- 1 5 Vital Signs Has the patient been seen at the hospital within the last three years: Yes Total Score: 80 Level Of Care: New/Established - Level 3 Electronic Signature(s) Signed: 08/18/2019 3:58:38 PM By: [name] [name] Entered By: [name] [name] on 08/18/2019 11:38:08 [name] [name] ([id number]) -------------------------------------------------------------------------------- Encounter Discharge Information Details Patient Name: [name] [name] A. Date of Service: 08/18/2019 11:00 AM Medical Record Number: [id number] Patient Account Number: [id number] Date of Birth/Sex: Jan 01, 1929 (86 y.o. F) Treating RN: [name] [name] Primary Care Dashon Mcintire: Benita Stabile Other Clinician: Referring Jeevan Kalla: Benita Stabile Treating Marcelis Wissner/Extender: Melburn Hake, [name] [name] Weeks in  Treatment: 24 Encounter Discharge Information Items Discharge Condition: Stable Ambulatory Status: Wheelchair Discharge Destination: Skilled Nursing Facility Telephoned: Yes Orders Sent: Yes Transportation: Private Auto Accompanied By: family Schedule Follow-up Appointment: Yes Clinical Summary of Care: Electronic Signature(s) Signed: 08/18/2019 3:58:38 PM By: [name] [name] Entered By: [name] [name] on 08/18/2019 11:38:58 [name] [name], [name] ([id number]) -------------------------------------------------------------------------------- Lower Extremity Assessment Details Patient Name: [name] [name] A. Date of Service: 08/18/2019 11:00 AM Medical Record Number: [id number] Patient Account Number: [id number] Date of Birth/Sex: 1929-01-05 (86 y.o. F) Treating RN: Montey Hora Primary Care Kimbley Sprague: Benita Stabile Other Clinician: Referring Maruice Pieroni: Benita Stabile Treating Habiba Treloar/Extender: Melburn Hake, [name] [name] Weeks in Treatment: 24 Electronic Signature(s) Signed: 08/18/2019 4:50:17 PM By: Montey Hora Entered By: Montey Hora on 08/18/2019 11:10:54 [name] [name], [name] [name] ([id number]) -------------------------------------------------------------------------------- Multi Wound Chart Details Patient Name: [name] [name] A. Date of Service: 08/18/2019 11:00 AM Medical Record Number: [id number] Patient Account Number: [id number] Date of Birth/Sex: Jun 25, 1928 (86 y.o. F) Treating RN: [name] [name] Primary Care Sachit Gilman: Benita Stabile Other Clinician: Referring Adyson Vanburen: TATE, Sharlet Salina Treating Romyn Boswell/Extender: [name] [name], [name] [name] Weeks in Treatment: 24 Vital Signs Height(in): 67 Pulse(bpm): 99 Weight(lbs): 140 Blood Pressure(mmHg): 154/80 Body Mass Index(BMI): 22 Temperature(F): 97.8 Respiratory Rate(breaths/min): 16 Photos: [N/A:N/A] Wound Location: Midline Sacrum N/A N/A Wounding Event: Pressure Injury N/A N/A Primary Etiology: Pressure Ulcer N/A N/A Comorbid History: Glaucoma, Hypertension, N/A N/A Osteoarthritis,  Dementia Date Acquired: 07/18/2018 N/A N/A Weeks of Treatment: 24 N/A N/A Wound Status: Open N/A N/A Clustered Wound: Yes N/A N/A Clustered Quantity: 1 N/A N/A Measurements L x W x D (cm) 3.5x3.2x2.3 N/A N/A Area (cm) : 8.796 N/A N/A Volume (cm) : 20.232 N/A N/A % Reduction in Area: 49.10% N/A N/A % Reduction in Volume: -1070.80% N/A N/A Starting Position 1 (o'clock): 12 Ending Position 1 (o'clock): 6 Maximum Distance 1 (cm): 2.8 Undermining: Yes N/A N/A  Classification: Category/Stage IV N/A N/A Exudate Amount: Medium N/A N/A Exudate Type: Serous N/A N/A Exudate Color: amber N/A N/A Wound Margin: Indistinct, nonvisible N/A N/A Granulation Amount: Large (67-100%) N/A N/A Granulation Quality: Red N/A N/A Necrotic Amount: Small (1-33%) N/A N/A Exposed Structures: Fat Layer (Subcutaneous Tissue) N/A N/A Exposed: Yes Muscle: Yes Fascia: No Tendon: No Joint: No Bone: No Epithelialization: Medium (34-66%) N/A N/A Treatment Notes [name] [name], [name] ([id number]) Electronic Signature(s) Signed: 08/18/2019 3:58:38 PM By: [name] [name] Entered By: [name] [name] on 08/18/2019 11:35:04 [name] [name] ([id number]) -------------------------------------------------------------------------------- Multi-Disciplinary Care Plan Details Patient Name: [name] [name] A. Date of Service: 08/18/2019 11:00 AM Medical Record Number: [id number] Patient Account Number: [id number] Date of Birth/Sex: 01-07-1929 (86 y.o. F) Treating RN: [name] [name] Primary Care Sharise Lippy: Dewaine Oats Other Clinician: Referring Payne Garske: Dewaine Oats Treating Saraiah Bhat/Extender: [name] [name], [name] [name] Weeks in Treatment: 24 Active Inactive Abuse / Safety / Falls / Self Care Management Nursing Diagnoses: Potential for falls Goals: Patient will remain injury free related to falls Date Initiated: 03/03/2019 Target Resolution Date: 03/16/2019 Goal Status: Active Interventions: Assess fall risk on admission and as  needed Notes: Medication Nursing Diagnoses: Knowledge deficit related to medication safety: actual or potential Goals: Patient/caregiver will demonstrate understanding of all current medications Date Initiated: 03/03/2019 Target Resolution Date: 03/16/2019 Goal Status: Active Interventions: Assess for medication contraindications each visit where new medications are prescribed Treatment Activities: New medication prescribed at Wound Center : 03/03/2019 Notes: Necrotic Tissue Nursing Diagnoses: Impaired tissue integrity related to necrotic/devitalized tissue Goals: Necrotic/devitalized tissue will be minimized in the wound bed Date Initiated: 03/03/2019 Target Resolution Date: 03/16/2019 Goal Status: Active Interventions: Assess patient pain level pre-, during and post procedure and prior to discharge Treatment Activities: Apply topical anesthetic as ordered : 03/03/2019 Notes: [name] [name], [name] [name] ([id number]) Nutrition Nursing Diagnoses: Potential for alteratiion in Nutrition/Potential for imbalanced nutrition Goals: Patient/caregiver agrees to and verbalizes understanding of need to use nutritional supplements and/or vitamins as prescribed Date Initiated: 03/03/2019 Target Resolution Date: 03/16/2019 Goal Status: Active Interventions: Provide education on nutrition Notes: Pressure Nursing Diagnoses: Knowledge deficit related to management of pressures ulcers Goals: Patient/caregiver will verbalize understanding of pressure ulcer management Date Initiated: 03/03/2019 Target Resolution Date: 03/16/2019 Goal Status: Active Interventions: Assess: immobility, friction, shearing, incontinence upon admission and as needed Provide education on pressure ulcers Treatment Activities: Patient referred for home evaluation of offloading devices/mattresses : 03/03/2019 Patient referred for pressure reduction/relief devices : 03/03/2019 Pressure reduction/relief device ordered :  03/03/2019 Notes: Wound/Skin Impairment Nursing Diagnoses: Impaired tissue integrity Goals: Ulcer/skin breakdown will have a volume reduction of 30% by week 4 Date Initiated: 03/03/2019 Target Resolution Date: 03/03/2019 Goal Status: Active Interventions: Assess ulceration(s) every visit Treatment Activities: Skin care regimen initiated : 03/03/2019 Notes: Electronic Signature(s) Signed: 08/18/2019 3:58:38 PM By: [name] [name] Entered By: [name] [name] on 08/18/2019 11:34:51 [name] [name], [name] ([id number]) -------------------------------------------------------------------------------- Pain Assessment Details Patient Name: [name] [name] A. Date of Service: 08/18/2019 11:00 AM Medical Record Number: [id number] Patient Account Number: [id number] Date of Birth/Sex: 05-22-1928 (86 y.o. F) Treating RN: [name] [name] Primary Care Poppy Mcafee: Dewaine Oats Other Clinician: Referring Karryn Kosinski: Dewaine Oats Treating Alacia Rehmann/Extender: [name] [name], [name] [name] Weeks in Treatment: 24 Active Problems Location of Pain Severity and Description of Pain Patient Has Paino Patient Unable to Respond Site Locations Pain Management and Medication Current Pain Management: Electronic Signature(s) Signed: 08/18/2019 4:50:17 PM By: [name] [name] Entered By: [name] [name] on 08/18/2019 11:10:13 [name] [name] ([id number]) -------------------------------------------------------------------------------- Patient/Caregiver Education Details Patient Name: [name] [name] A. Date  of Service: 08/18/2019 11:00 AM Medical Record Number: [id number] Patient Account Number: [id number] Date of Birth/Gender: Sep 05, 1928 (86 y.o. F) Treating RN: [name] [name] Primary Care Physician: Dewaine Oats Other Clinician: Referring Physician: Dewaine Oats Treating Physician/Extender: Skeet Simmer in Treatment: 24 Education Assessment Education Provided To: Patient Education Topics Provided Wound/Skin Impairment: Handouts: Caring for Your  Ulcer Methods: Demonstration, Explain/Verbal Responses: State content correctly Electronic Signature(s) Signed: 08/18/2019 3:58:38 PM By: [name] [name] Entered By: [name] [name] on 08/18/2019 11:38:21 [name] [name], [name] ([id number]) -------------------------------------------------------------------------------- Wound Assessment Details Patient Name: [name] [name] A. Date of Service: 08/18/2019 11:00 AM Medical Record Number: [id number] Patient Account Number: [id number] Date of Birth/Sex: 10-Nov-1928 (86 y.o. F) Treating RN: [name] [name] Primary Care Kanasia Gayman: Dewaine Oats Other Clinician: Referring Bryauna Byrum: Dewaine Oats Treating Dayanira Giovannetti/Extender: [name] [name], [name] [name] Weeks in Treatment: 24 Wound Status Wound Number: 4 Primary Etiology: Pressure Ulcer Wound Location: Midline Sacrum Wound Status: Open Wounding Event: Pressure Injury Comorbid History: Glaucoma, Hypertension, Osteoarthritis, Dementia Date Acquired: 07/18/2018 Weeks Of Treatment: 24 Clustered Wound: Yes Photos Wound Measurements Length: (cm) 3.5 % Width: (cm) 3.2 % Depth: (cm) 2.3 E Clustered Quantity: 1 T Area: (cm) 8.796 Volume: (cm) 20.232 Reduction in Area: 49.1% Reduction in Volume: -1070.8% pithelialization: Medium (34-66%) unneling: No Undermining: Yes Starting Position (o'clock): 12 Ending Position (o'clock): 6 Maximum Distance: (cm) 2.8 Wound Description Classification: Category/Stage IV Wound Margin: Indistinct, nonvisible Exudate Amount: Medium Exudate Type: Serous Exudate Color: amber Foul Odor After Cleansing: No Slough/Fibrino Yes Wound Bed Granulation Amount: Large (67-100%) Exposed Structure Granulation Quality: Red Fascia Exposed: No Necrotic Amount: Small (1-33%) Fat Layer (Subcutaneous Tissue) Exposed: Yes Necrotic Quality: Adherent Slough Tendon Exposed: No Muscle Exposed: Yes Necrosis of Muscle: No Joint Exposed: No Bone Exposed: No Treatment Notes Wound #4 (Midline Sacrum) [name] [name], [name]  A. ([id number]) Notes Dakins gauze with ABD and tape to midline sacrum Electronic Signature(s) Signed: 08/18/2019 4:50:17 PM By: [name] [name] Entered By: [name] [name] on 08/18/2019 11:29:16 Savas, Halea A. ([id number]) -------------------------------------------------------------------------------- Vitals Details Patient Name: [name] [name] A. Date of Service: 08/18/2019 11:00 AM Medical Record Number: [id number] Patient Account Number: [id number] Date of Birth/Sex: [birth year] (86 y.o. F) Treating RN: [name] [name] Primary Care Sherri Levenhagen: Dewaine Oats Other Clinician: Referring Tynisha Ogan: TATE, Katherina Right Treating Casi Westerfeld/Extender: [name] [name], [name] [name] Weeks in Treatment: 24 Vital Signs Time Taken: 11:10 Temperature (F): 97.8 Height (in): 67 Pulse (bpm): 99 Weight (lbs): 140 Respiratory Rate (breaths/min): 16 Body Mass Index (BMI): 21.9 Blood Pressure (mmHg): 154/80 Reference Range: 80 - 120 mg / dl Electronic Signature(s) Signed: 08/18/2019 4:50:17 PM By: [name] [name] Entered By: [name] [name] on 08/18/2019 11:10:36

## 2019-08-25 ENCOUNTER — Encounter: Payer: Medicare Other | Admitting: Physician Assistant

## 2019-08-25 ENCOUNTER — Other Ambulatory Visit: Payer: Self-pay

## 2019-08-25 DIAGNOSIS — L89623 Pressure ulcer of left heel, stage 3: Secondary | ICD-10-CM | POA: Diagnosis not present

## 2019-08-25 NOTE — Progress Notes (Addendum)
BROOK, GERACI ([id number]) Visit Report for 08/25/2019 Chief Complaint Document Details Patient Name: [name], [name] Date of Service: 08/25/2019 8:30 AM Medical Record Number: [id number] Patient Account Number: [id number] Date of Birth/Sex: Oct 14, 1928 (86 y.o. F) Treating RN: Rodell Perna Primary Care Provider: Dewaine Oats Other Clinician: Referring Provider: Dewaine Oats Treating Provider/Extender: Linwood Dibbles, Daylene Vandenbosch Weeks in Treatment: 25 Information Obtained from: Patient Chief Complaint Multiple pressure ulcers Electronic Signature(s) Signed: 08/25/2019 8:44:07 AM By: Lenda Kelp PA-C Entered By: Lenda Kelp on 08/25/2019 08:44:07 [name] [name], [name] Stacks A. ([id number]) -------------------------------------------------------------------------------- HPI Details Patient Name: [name] Lye A. Date of Service: 08/25/2019 8:30 AM Medical Record Number: [id number] Patient Account Number: [id number] Date of Birth/Sex: 07/02/1928 (86 y.o. F) Treating RN: Rodell Perna Primary Care Provider: Dewaine Oats Other Clinician: Referring Provider: Dewaine Oats Treating Provider/Extender: Linwood Dibbles, Iyahna Obriant Weeks in Treatment: 25 History of Present Illness HPI Description: 03/03/2019 on evaluation today patient presents for initial evaluation here in the clinic today concerning issues that she has been having with wounds which are pressure in nature at multiple locations currently. This is on her left heel, right ankle, left hip/ischial location, and all her to varying degrees of severity and depth. The worst is on the left ischial location. At this time the patient has been tolerating dressing changes although I am not exactly sure the dressings that have been utilized prior to coming in today. She does have some necrotic tissue noted at several locations this can require at least some degree of cleaning prior to application of dressings going forward. The patient does have dementia and unfortunately in recent months has  become increasingly immobile requiring more significant treatment she was just recently moved roughly 2 weeks ago from assisted living to a skilled nursing facility at [facility]. Based on what I am seeing currently these pressure injuries occurred prior to that 2-week time I do not see any new or obvious pressure injury at this point. I discussed with the patient as well as her family member that this does appear to be doing better in my opinion than likely where things started. They are unsure as to whether or not she has an air mattress at the facility I do think that would be something that would be appropriate and good for her to have but at the moment I do not know for sure whether that is already in place if not that definitely is can be 1 of the recommendations. Also think a Roho cushion for her wheelchair would be good as well as Prevalon offloading boots. The patient currently does not have any severe pain although when I was cleaning some of the areas she did note there was some discomfort at several of the locations. She does have a history of hypertension as well. 10/29; this is a patient from [facility] nursing home. She is nonambulatory. She is here for follow-up on 4 different wounds 1 over the left lateral malleolus, the right medial calcaneus, the left posterior hip and I believe a new area over the sacrum. The patient is nonambulatory but apparently eats well. 03/24/2019 on evaluation today patient actually appears to be doing better with regard to her wounds in general based on what I am seeing. Fortunately there is no signs of active infection at this time. No fever chills noted. Overall been very pleased with the progress that seems to be occurring since I last saw her. There is a little bit of debridement That will need to be performed  today. 04/07/2019 on evaluation today patient actually appears to be doing well in some regards based on what I am seeing today.  Fortunately there is no signs of active infection at this time which is good news. No fever chills noted. With that being said the wounds on her foot and ankle region in general have healed at this point. I do not see anything open in that region. With regard to the other 2 wounds in the left ischium and midline sacral region I believe that we may need to make a dressing change at this point to do something a little bit more conducive to cleaning up the wound bed. The patient is in agreement with that plan today. 04/21/2019 on evaluation today patient actually appears to be doing well with regard to her wounds all things considered. Both appear to be much cleaner than what they were previous. Fortunately there is no signs of active infection at this time. Both I think are doing well with the current wound care measures which is the Dakin moistened gauze dressings. 05/05/2019 on evaluation today patient seems to be doing much better in regard to her ischial ulcer unfortunately she still seems to be getting pressure to the sacral region. I think that this is evidenced by the fact that she does have some surrounding deep tissue injury and the fact that the wound is actually measuring larger not better. Fortunately there is no evidence of active infection at this time I do believe the Dakin's is still the best option treatment wise but again I do think the patient needs more appropriate offloading on the sacral region. 06/24/2019 on evaluation today patient appears to be doing well with regard to the wound over the ischium which appears to be completely healed. This is great news. Fortunately there does appear to be some improvement with regard to the sacral wound as well. There still does seem to be evidence of necrotic tissue noted minimally that I was able to gently clean away. With that being said the wound itself seems to be doing quite well which is good news. She seems to be offloaded appropriately in  my opinion at this point. I think she may be ready for a wound VAC. 07/14/2019 on evaluation today patient appears to be doing well with regard to her sacral wound in general although unfortunately she does appear to have some evidence of potential infection there is some odor that seems to be a little stronger than just what would normally be noted with the wound VAC. With that being said I do think we need to address this and though I think the wound VAC is appropriate to continue I think that we do need to see about a culture as well as placing her on some antibiotic therapy at this point. The patient's granddaughter was present during evaluation today as well. 07/28/2019 upon evaluation today patient unfortunately appears to not be doing quite as well as I would like with regard to the wound VAC and really was not applied appropriately. The phone was applied directly to the skin which did cause a little bit of breakdown around the opening of the wound. Subsequently is also unfortunately the case that the foam that was down at the sacral region and that was tracked up and along her back was not actually connected so they touched this obviously inhibits the ability to be able to properly drain fluid from 1 to the other the have to be continuous and touching. With  that being said there was some improvement today in the overall appearance of the wound bed though obviously size wise not much difference today. We are going to get in touch with the facility to try to make sure that they know what they are doing with applying the wound VAC I am also can likely see her for more frequent follow-up for the time being. All this was discussed with the patient's grandson during the office visit today as well. 08/11/19 upon evaluation today patient appears to be doing poorly in regard to her wound at this time. Unfortunately there appears to be infection at this time including blue-green drainage and a poor quality  to the granulation tissue all of which point toward infection she also seems to be much more Roam, Mahrosh A. ([id number]) painful than we noticed in the past. She is moving around a lot just with very light touch around the area I think that is an indication of discomfort 08/18/2019 upon evaluation today patient appears to be doing excellent in regard to her wound as compared to last evaluation. There is still some odor but she does not have as much of the necrotic tissue nor the significant drainage that I noted at the last visit. There still does appear to be evidence of infection and she still is much more tender than what she was experiencing prior to the infection noted last week. 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 I do believe that she likely is getting need to see infectious disease due to the ongoing nature of this infection. For now would you still have to hold off on reinitiating the wound VAC. 08/25/2019 upon evaluation today patient appears to be doing excellent with regard to the overall appearance of the wound though there is still is an odor and I do think that though the wound VAC would be best for her were not quite at the point of getting that on yet she actually has her appointment with infectious disease this afternoon it sounds like this will be with Dr. Sampson Goon. Fortunately there is no signs of systemic infection at this time. Electronic Signature(s) Signed: 08/25/2019 9:19:18 AM By: Lenda Kelp PA-C Entered By: Lenda Kelp on 08/25/2019 09:19:17 [name] ([id number]) -------------------------------------------------------------------------------- Physical Exam Details Patient Name: [name] Lye A. Date of Service: 08/25/2019 8:30 AM Medical Record Number: [id number] Patient Account Number: [id number] Date of Birth/Sex: 06/28/1928 (86 y.o. F) Treating RN: Rodell Perna Primary Care Provider: Dewaine Oats  Other Clinician: Referring Provider: TATE, Katherina Right Treating Provider/Extender: STONE III, Tyshauna Finkbiner Weeks in Treatment: 25 Constitutional Well-nourished and well-hydrated in no acute distress. Respiratory normal breathing without difficulty. Psychiatric this patient is able to make decisions and demonstrates good insight into disease process. Alert and Oriented x 3. pleasant and cooperative. Notes Upon inspection patient's wound appeared to have fairly decent granulation there was some odor even after cleaning which I think is still indicative of infection at this point. However the wound does appear to be much better the Dakin's is helping to manage this to some degree. Electronic Signature(s) Signed: 08/25/2019 9:19:36 AM By: Lenda Kelp PA-C Entered By: Lenda Kelp on 08/25/2019 09:19:35 [name] [name], [name] ([id number]) -------------------------------------------------------------------------------- Physician Orders Details Patient Name: [name] Lye A. Date of Service: 08/25/2019 8:30 AM Medical Record Number: [id number] Patient Account Number: [id number] Date of Birth/Sex: [birth year] (86 y.o. F) Treating RN: Rodell Perna Primary Care Provider: Dewaine Oats Other  Clinician: Referring Provider: TATE, Katherina Right Treating Provider/Extender: Linwood Dibbles, Lavenia Stumpo Weeks in Treatment: 25 Verbal / Phone Orders: No Diagnosis Coding ICD-10 Coding Code Description L89.623 Pressure ulcer of left heel, stage 3 L89.513 Pressure ulcer of right ankle, stage 3 L89.224 Pressure ulcer of left hip, stage 4 L89.153 Pressure ulcer of sacral region, stage 3 I10 Essential (primary) hypertension F01.50 Vascular dementia without behavioral disturbance Wound Cleansing Wound #4 Midline Sacrum o Clean wound with Normal Saline. o Cleanse wound with mild soap and water Primary Wound Dressing Wound #4 Midline Sacrum o Other: - Dakins moistened gauze packed Secondary Dressing Wound #4 Midline Sacrum o ABD pad -  secure with tape Dressing Change Frequency Wound #4 Midline Sacrum o Change dressing every day. Follow-up Appointments Wound #4 Midline Sacrum o Return Appointment in 1 week. Off-Loading Wound #4 Midline Sacrum o Roho cushion for wheelchair - pt needs Roho cushion for her chair o Mattress - Pt needs Air mattress o Turn and reposition every 2 hours - Pt is still getting pressure to her midline sacrum. Please reposition pt EVERY 2 HOURS Negative Pressure Wound Therapy Wound #4 Midline Sacrum o Place NPWT on HOLD. - Until infection is under control Electronic Signature(s) Signed: 08/25/2019 1:30:06 PM By: Rodell Perna Signed: 08/26/2019 10:31:16 AM By: Lenda Kelp PA-C Entered By: Rodell Perna on 08/25/2019 09:15:41 Olvera, Aisling A. ([id number]) -------------------------------------------------------------------------------- Problem List Details Patient Name: [name] Lye A. Date of Service: 08/25/2019 8:30 AM Medical Record Number: [id number] Patient Account Number: [id number] Date of Birth/Sex: [birth year] (86 y.o. F) Treating RN: Rodell Perna Primary Care Provider: Dewaine Oats Other Clinician: Referring Provider: Dewaine Oats Treating Provider/Extender: Linwood Dibbles, Graycen Sadlon Weeks in Treatment: 25 Active Problems ICD-10 Evaluated Encounter Code Description Active Date Today Diagnosis L89.623 Pressure ulcer of left heel, stage 3 03/03/2019 No Yes L89.513 Pressure ulcer of right ankle, stage 3 03/03/2019 No Yes L89.224 Pressure ulcer of left hip, stage 4 03/03/2019 No Yes L89.153 Pressure ulcer of sacral region, stage 3 03/24/2019 No Yes I10 Essential (primary) hypertension 03/03/2019 No Yes F01.50 Vascular dementia without behavioral disturbance 03/03/2019 No Yes Inactive Problems Resolved Problems Electronic Signature(s) Signed: 08/25/2019 8:43:58 AM By: Lenda Kelp PA-C Entered By: Lenda Kelp on 08/25/2019 08:43:58 [name] [name], [name]  ([id number]) -------------------------------------------------------------------------------- Progress Note Details Patient Name: [name] Lye A. Date of Service: 08/25/2019 8:30 AM Medical Record Number: [id number] Patient Account Number: [id number] Date of Birth/Sex: May 24, 1928 (86 y.o. F) Treating RN: Rodell Perna Primary Care Provider: Dewaine Oats Other Clinician: Referring Provider: Dewaine Oats Treating Provider/Extender: Linwood Dibbles, Marirose Deveney Weeks in Treatment: 25 Subjective Chief Complaint Information obtained from Patient Multiple pressure ulcers History of Present Illness (HPI) 03/03/2019 on evaluation today patient presents for initial evaluation here in the clinic today concerning issues that she has been having with wounds which are pressure in nature at multiple locations currently. This is on her left heel, right ankle, left hip/ischial location, and all her to varying degrees of severity and depth. The worst is on the left ischial location. At this time the patient has been tolerating dressing changes although I am not exactly sure the dressings that have been utilized prior to coming in today. She does have some necrotic tissue noted at several locations this can require at least some degree of cleaning prior to application of dressings going forward. The patient does have dementia and unfortunately in recent months has become increasingly immobile requiring more significant treatment she was just recently moved roughly 2 weeks ago from assisted  living to a skilled nursing facility at [facility]. Based on what I am seeing currently these pressure injuries occurred prior to that 2-week time I do not see any new or obvious pressure injury at this point. I discussed with the patient as well as her family member that this does appear to be doing better in my opinion than likely where things started. They are unsure as to whether or not she has an air mattress at the facility I do think  that would be something that would be appropriate and good for her to have but at the moment I do not know for sure whether that is already in place if not that definitely is can be 1 of the recommendations. Also think a Roho cushion for her wheelchair would be good as well as Prevalon offloading boots. The patient currently does not have any severe pain although when I was cleaning some of the areas she did note there was some discomfort at several of the locations. She does have a history of hypertension as well. 10/29; this is a patient from [facility] nursing home. She is nonambulatory. She is here for follow-up on 4 different wounds 1 over the left lateral malleolus, the right medial calcaneus, the left posterior hip and I believe a new area over the sacrum. The patient is nonambulatory but apparently eats well. 03/24/2019 on evaluation today patient actually appears to be doing better with regard to her wounds in general based on what I am seeing. Fortunately there is no signs of active infection at this time. No fever chills noted. Overall been very pleased with the progress that seems to be occurring since I last saw her. There is a little bit of debridement That will need to be performed today. 04/07/2019 on evaluation today patient actually appears to be doing well in some regards based on what I am seeing today. Fortunately there is no signs of active infection at this time which is good news. No fever chills noted. With that being said the wounds on her foot and ankle region in general have healed at this point. I do not see anything open in that region. With regard to the other 2 wounds in the left ischium and midline sacral region I believe that we may need to make a dressing change at this point to do something a little bit more conducive to cleaning up the wound bed. The patient is in agreement with that plan today. 04/21/2019 on evaluation today patient actually appears to be  doing well with regard to her wounds all things considered. Both appear to be much cleaner than what they were previous. Fortunately there is no signs of active infection at this time. Both I think are doing well with the current wound care measures which is the Dakin moistened gauze dressings. 05/05/2019 on evaluation today patient seems to be doing much better in regard to her ischial ulcer unfortunately she still seems to be getting pressure to the sacral region. I think that this is evidenced by the fact that she does have some surrounding deep tissue injury and the fact that the wound is actually measuring larger not better. Fortunately there is no evidence of active infection at this time I do believe the Dakin's is still the best option treatment wise but again I do think the patient needs more appropriate offloading on the sacral region. 06/24/2019 on evaluation today patient appears to be doing well with regard to the wound over the  ischium which appears to be completely healed. This is great news. Fortunately there does appear to be some improvement with regard to the sacral wound as well. There still does seem to be evidence of necrotic tissue noted minimally that I was able to gently clean away. With that being said the wound itself seems to be doing quite well which is good news. She seems to be offloaded appropriately in my opinion at this point. I think she may be ready for a wound VAC. 07/14/2019 on evaluation today patient appears to be doing well with regard to her sacral wound in general although unfortunately she does appear to have some evidence of potential infection there is some odor that seems to be a little stronger than just what would normally be noted with the wound VAC. With that being said I do think we need to address this and though I think the wound VAC is appropriate to continue I think that we do need to see about a culture as well as placing her on some antibiotic  therapy at this point. The patient's granddaughter was present during evaluation today as well. 07/28/2019 upon evaluation today patient unfortunately appears to not be doing quite as well as I would like with regard to the wound VAC and really was not applied appropriately. The phone was applied directly to the skin which did cause a little bit of breakdown around the opening of the wound. Subsequently is also unfortunately the case that the foam that was down at the sacral region and that was tracked up and along her back was not actually connected so they touched this obviously inhibits the ability to be able to properly drain fluid from 1 to the other the have to be continuous and touching. With that being said there was some improvement today in the overall appearance of the wound bed though obviously size wise not much difference today. We are going to get in touch with the facility to try to make sure that they know what they are doing with applying the wound VAC I am [name] [name], [name]. ([id number]) also can likely see her for more frequent follow-up for the time being. All this was discussed with the patient's grandson during the office visit today as well. 08/11/19 upon evaluation today patient appears to be doing poorly in regard to her wound at this time. Unfortunately there appears to be infection at this time including blue-green drainage and a poor quality to the granulation tissue all of which point toward infection she also seems to be much more painful than we noticed in the past. She is moving around a lot just with very light touch around the area I think that is an indication of discomfort 08/18/2019 upon evaluation today patient appears to be doing excellent in regard to her wound as compared to last evaluation. There is still some odor but she does not have as much of the necrotic tissue nor the significant drainage that I noted at the last visit. There still does appear to be evidence  of infection and she still is much more tender than what she was experiencing prior to the infection noted last week. 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 I do believe that she likely is getting need to see infectious disease due to the ongoing nature of this infection. For now would you still have to hold off on reinitiating the wound VAC. 08/25/2019 upon  evaluation today patient appears to be doing excellent with regard to the overall appearance of the wound though there is still is an odor and I do think that though the wound VAC would be best for her were not quite at the point of getting that on yet she actually has her appointment with infectious disease this afternoon it sounds like this will be with Dr. Sampson GoonFitzgerald. Fortunately there is no signs of systemic infection at this time. Objective Constitutional Well-nourished and well-hydrated in no acute distress. Vitals Time Taken: 8:57 AM, Height: 67 in, Weight: 140 lbs, BMI: 21.9, Temperature: 97.8 F, Pulse: 97 bpm, Respiratory Rate: 16 breaths/min, Blood Pressure: 136/79 mmHg. Respiratory normal breathing without difficulty. Psychiatric this patient is able to make decisions and demonstrates good insight into disease process. Alert and Oriented x 3. pleasant and cooperative. General Notes: Upon inspection patient's wound appeared to have fairly decent granulation there was some odor even after cleaning which I think is still indicative of infection at this point. However the wound does appear to be much better the Dakin's is helping to manage this to some degree. Integumentary (Hair, Skin) Wound #4 status is Open. Original cause of wound was Pressure Injury. The wound is located on the Midline Sacrum. The wound measures 4.5cm length x 3.6cm width x 2.7cm depth; 12.723cm^2 area and 34.353cm^3 volume. There is muscle and Fat Layer (Subcutaneous Tissue) Exposed exposed. There  is no tunneling noted, however, there is undermining starting at 6:00 and ending at 12:00 with a maximum distance of 2.5cm. There is a medium amount of serous drainage noted. The wound margin is indistinct and nonvisible. There is large (67-100%) red granulation within the wound bed. There is a small (1-33%) amount of necrotic tissue within the wound bed including Adherent Slough. Assessment Active Problems ICD-10 Pressure ulcer of left heel, stage 3 Pressure ulcer of right ankle, stage 3 Pressure ulcer of left hip, stage 4 Pressure ulcer of sacral region, stage 3 Essential (primary) hypertension Vascular dementia without behavioral disturbance [name] [name], [name] ([id number]) Plan Wound Cleansing: Wound #4 Midline Sacrum: Clean wound with Normal Saline. Cleanse wound with mild soap and water Primary Wound Dressing: Wound #4 Midline Sacrum: Other: - Dakins moistened gauze packed Secondary Dressing: Wound #4 Midline Sacrum: ABD pad - secure with tape Dressing Change Frequency: Wound #4 Midline Sacrum: Change dressing every day. Follow-up Appointments: Wound #4 Midline Sacrum: Return Appointment in 1 week. Off-Loading: Wound #4 Midline Sacrum: Roho cushion for wheelchair - pt needs Roho cushion for her chair Mattress - Pt needs Air mattress Turn and reposition every 2 hours - Pt is still getting pressure to her midline sacrum. Please reposition pt EVERY 2 HOURS Negative Pressure Wound Therapy: Wound #4 Midline Sacrum: Place NPWT on HOLD. - Until infection is under control 1. I would recommend currently that we go ahead and initiate treatment with a continuation of the Dakin's moistened gauze for the time being. I will plan to see her back in 1 week for reevaluation. 2. I am also can recommend at this time that we continue with using the air mattress as well as a Roho cushion for her wheelchair. 3. With regard to the infection I do think that Dr. Sampson GoonFitzgerald whom I think she will be  seeing at 12 would be excellent as far as this is concerned and helping to get this under control. Hopefully the patient will do very well will be able to reinitiate the wound VAC in short order. We will see  patient back for reevaluation in 1 week here in the clinic. If anything worsens or changes patient will contact our office for additional recommendations. Electronic Signature(s) Signed: 08/25/2019 9:20:17 AM By: Worthy Keeler PA-C Entered By: Worthy Keeler on 08/25/2019 09:20:17 [name] [name], [name] [name] ([id number]) -------------------------------------------------------------------------------- SuperBill Details Patient Name: [name] Roan A. Date of Service: 08/25/2019 Medical Record Number: [id number] Patient Account Number: [id number] Date of Birth/Sex: [birth year] (86 y.o. F) Treating RN: Army Melia Primary Care Provider: Benita Stabile Other Clinician: Referring Provider: Benita Stabile Treating Provider/Extender: Melburn Hake, Jesiel Garate Weeks in Treatment: 25 Diagnosis Coding ICD-10 Codes Code Description [phone] Pressure ulcer of left heel, stage 3 L89.513 Pressure ulcer of right ankle, stage 3 L89.224 Pressure ulcer of left hip, stage 4 L89.153 Pressure ulcer of sacral region, stage 3 I10 Essential (primary) hypertension F01.50 Vascular dementia without behavioral disturbance Facility Procedures CPT4 Code: 10626948 Description: 99213 - WOUND CARE VISIT-LEV 3 EST PT Modifier: Quantity: 1 Physician Procedures CPT4 Code: 5462703 Description: 50093 - WC PHYS LEVEL 4 - EST PT Modifier: Quantity: 1 CPT4 Code: Description: ICD-10 Diagnosis Description L89.623 Pressure ulcer of left heel, stage 3 L89.513 Pressure ulcer of right ankle, stage 3 L89.224 Pressure ulcer of left hip, stage 4 L89.153 Pressure ulcer of sacral region, stage 3 Modifier: Quantity: Electronic Signature(s) Signed: 08/25/2019 9:20:50 AM By: Worthy Keeler PA-C Entered By: Worthy Keeler on 08/25/2019 09:20:50

## 2019-08-25 NOTE — Progress Notes (Addendum)
AVIYA, JARVIE ([id number]) Visit Report for 08/25/2019 Arrival Information Details Patient Name: [name] [name], [name] Date of Service: 08/25/2019 8:30 AM Medical Record Number: [id number] Patient Account Number: [id number] Date of Birth/Sex: 1929-03-19 (86 y.o. F) Treating RN: Cornell Barman Primary Care Quintin Hjort: Benita Stabile Other Clinician: Referring Lavette Yankovich: Benita Stabile Treating Kristle Wesch/Extender: Melburn Hake, HOYT Weeks in Treatment: 25 Visit Information History Since Last Visit Pain Present Now: Unable to Respond Patient Arrived: Wheel Chair Arrival Time: 08:50 Accompanied By: granddaughter Transfer Assistance: Hoyer Lift Patient Identification Verified: Yes Secondary Verification Process Completed: Yes Electronic Signature(s) Signed: 08/26/2019 10:29:18 AM By: Gretta Cool, BSN, RN, CWS, Kim RN, BSN Entered By: Gretta Cool, BSN, RN, CWS, Kim on 08/25/2019 08:50:38 Susa Griffins ([id number]) -------------------------------------------------------------------------------- Clinic Level of Care Assessment Details Patient Name: [name] Roan A. Date of Service: 08/25/2019 8:30 AM Medical Record Number: [id number] Patient Account Number: [id number] Date of Birth/Sex: 01-17-1929 (86 y.o. F) Treating RN: Army Melia Primary Care Aidon Klemens: Benita Stabile Other Clinician: Referring Anam Bobby: Benita Stabile Treating Shayn Madole/Extender: Melburn Hake, HOYT Weeks in Treatment: 25 Clinic Level of Care Assessment Items TOOL 4 Quantity Score []  - Use when only an EandM is performed on FOLLOW-UP visit 0 ASSESSMENTS - Nursing Assessment / Reassessment X - Reassessment of Co-morbidities (includes updates in patient status) 1 10 X- 1 5 Reassessment of Adherence to Treatment Plan ASSESSMENTS - Wound and Skin Assessment / Reassessment X - Simple Wound Assessment / Reassessment - one wound 1 5 []  - 0 Complex Wound Assessment / Reassessment - multiple wounds []  - 0 Dermatologic / Skin Assessment (not related to wound area) ASSESSMENTS  - Focused Assessment []  - Circumferential Edema Measurements - multi extremities 0 []  - 0 Nutritional Assessment / Counseling / Intervention []  - 0 Lower Extremity Assessment (monofilament, tuning fork, pulses) []  - 0 Peripheral Arterial Disease Assessment (using hand held doppler) ASSESSMENTS - Ostomy and/or Continence Assessment and Care []  - Incontinence Assessment and Management 0 []  - 0 Ostomy Care Assessment and Management (repouching, etc.) PROCESS - Coordination of Care X - Simple Patient / Family Education for ongoing care 1 15 []  - 0 Complex (extensive) Patient / Family Education for ongoing care []  - 0 Staff obtains Programmer, systems, Records, Test Results / Process Orders []  - 0 Staff telephones HHA, Nursing Homes / Clarify orders / etc []  - 0 Routine Transfer to another Facility (non-emergent condition) []  - 0 Routine Hospital Admission (non-emergent condition) []  - 0 New Admissions / Biomedical engineer / Ordering NPWT, Apligraf, etc. []  - 0 Emergency Hospital Admission (emergent condition) X- 1 10 Simple Discharge Coordination []  - 0 Complex (extensive) Discharge Coordination PROCESS - Special Needs []  - Pediatric / Minor Patient Management 0 []  - 0 Isolation Patient Management []  - 0 Hearing / Language / Visual special needs []  - 0 Assessment of Community assistance (transportation, D/C planning, etc.) Broecker, [name] ([id number]) []  - 0 Additional assistance / Altered mentation []  - 0 Support Surface(s) Assessment (bed, cushion, seat, etc.) INTERVENTIONS - Wound Cleansing / Measurement X - Simple Wound Cleansing - one wound 1 5 []  - 0 Complex Wound Cleansing - multiple wounds X- 1 5 Wound Imaging (photographs - any number of wounds) []  - 0 Wound Tracing (instead of photographs) X- 1 5 Simple Wound Measurement - one wound []  - 0 Complex Wound Measurement - multiple wounds INTERVENTIONS - Wound Dressings []  - Small Wound Dressing one or multiple  wounds 0 X- 1 15 Medium Wound Dressing one or multiple wounds []  - 0  Large Wound Dressing one or multiple wounds []  - 0 Application of Medications - topical []  - 0 Application of Medications - injection INTERVENTIONS - Miscellaneous []  - External ear exam 0 []  - 0 Specimen Collection (cultures, biopsies, blood, body fluids, etc.) []  - 0 Specimen(s) / Culture(s) sent or taken to Lab for analysis []  - 0 Patient Transfer (multiple staff / / Similar devices) []  - 0 Simple Staple / Suture removal (25 or less) []  - 0 Complex Staple / Suture removal (26 or more) []  - 0 Hypo / Hyperglycemic Management (close monitor of Blood Glucose) []  - 0 Ankle / Brachial Index (ABI) - do not check if billed separately X- 1 5 Vital Signs Has the patient been seen at the hospital within the last three years: Yes Total Score: 80 Level Of Care: New/Established - Level 3 Electronic Signature(s) Signed: 08/25/2019 1:30:06 PM By: Entered By: on 08/25/2019 09:16:12 A. (Nurse, adult) -------------------------------------------------------------------------------- Encounter Discharge Information Details Patient Name: A. Date of Service: 08/25/2019 8:30 AM Medical Record Number: Patient Account Number: Date of Birth/Sex: May 07, 1929 (86 y.o. F) Treating RN: [name] [name] Primary Care Kailash Hinze: 10/25/2019 Other Clinician: Referring Jazziel Fitzsimmons: [name] [name] Treating Doreatha Offer/Extender: [id number], HOYT Weeks in Treatment: 25 Encounter Discharge Information Items Discharge Condition: Stable Ambulatory Status: Wheelchair Discharge Destination: Skilled Nursing Facility Telephoned: No Orders Sent: Yes Transportation: Private Auto Accompanied By: family Schedule Follow-up Appointment: Yes Clinical Summary of Care: Electronic Signature(s) Signed: 08/25/2019 1:30:06 PM By: 10/25/2019 Entered By: [id number] on 08/25/2019  09:16:55 [name], 10/21/1928 A. ([name]) -------------------------------------------------------------------------------- Lower Extremity Assessment Details Patient Name: [name] [name] A. Date of Service: 08/25/2019 8:30 AM Medical Record Number: Dewaine Oats Patient Account Number: Linwood Dibbles Date of Birth/Sex: 1928-09-27 (86 y.o. F) Treating RN: [name] [name] Primary Care Fusaye Wachtel: 10/25/2019 Other Clinician: Referring Brayla Pat: Marylouise Stacks Treating Aishia Barkey/Extender: [id number], HOYT Weeks in Treatment: 25 Electronic Signature(s) Signed: 08/25/2019 4:13:45 PM By: 10/25/2019 Entered By: [id number] on 08/25/2019 08:58:26 [name] [name], [name] (10/21/1928) -------------------------------------------------------------------------------- Multi Wound Chart Details Patient Name: [name] Date of Service: 08/25/2019 8:30 AM Medical Record Number: Dewaine Oats Patient Account Number: Dewaine Oats Date of Birth/Sex: [birth year] (86 y.o. F) Treating RN: Curtis Sites Primary Care Jerrell Hart: Curtis Sites Other Clinician: Referring Artemisia Auvil: TATE, 10/25/2019 Treating Ninetta Adelstein/Extender: STONE III, HOYT Weeks in Treatment: 25 Vital Signs Height(in): 67 Pulse(bpm): 97 Weight(lbs): 140 Blood Pressure(mmHg): 136/79 Body Mass Index(BMI): 22 Temperature(F): 97.8 Respiratory Rate(breaths/min): 16 Photos: [N/A:N/A] Wound Location: Midline Sacrum N/A N/A Wounding Event: Pressure Injury N/A N/A Primary Etiology: Pressure Ulcer N/A N/A Comorbid History: Glaucoma, Hypertension, N/A N/A Osteoarthritis, Dementia Date Acquired: 07/18/2018 N/A N/A Weeks of Treatment: 25 N/A N/A Wound Status: Open N/A N/A Clustered Wound: Yes N/A N/A Clustered Quantity: 1 N/A N/A Measurements L x W x D (cm) 4.5x3.6x2.7 N/A N/A Area (cm) : 12.723 N/A N/A Volume (cm) : 34.353 N/A N/A % Reduction in Area: 26.40% N/A N/A % Reduction in Volume: -1888.00% N/A N/A Starting Position 1 (o'clock): 6 Ending Position 1 (o'clock): 12 Maximum  Distance 1 (cm): 2.5 Undermining: Yes N/A N/A Classification: Category/Stage IV N/A N/A Exudate Amount: Medium N/A N/A Exudate Type: Serous N/A N/A Exudate Color: amber N/A N/A Wound Margin: Indistinct, nonvisible N/A N/A Granulation Amount: Large (67-100%) N/A N/A Granulation Quality: Red N/A N/A Necrotic Amount: Small (1-33%) N/A N/A Exposed Structures: Fat Layer (Subcutaneous Tissue) N/A N/A Exposed: Yes Muscle: Yes Fascia: No Tendon: No Joint: No Bone: No  Epithelialization: Medium (34-66%) N/A N/A Treatment Notes DORLA, GUIZAR A. ([id number]) Electronic Signature(s) Signed: 08/25/2019 1:30:06 PM By: [name] [name] Entered By: [name] [name] on 08/25/2019 09:15:10 [name] [name] ([id number]) -------------------------------------------------------------------------------- Multi-Disciplinary Care Plan Details Patient Name: [name] [name] A. Date of Service: 08/25/2019 8:30 AM Medical Record Number: [id number] Patient Account Number: [id number] Date of Birth/Sex: 1928/07/30 (86 y.o. F) Treating RN: [name] [name] Primary Care Shanea Karney: Dewaine Oats Other Clinician: Referring [place] Dalporto: Dewaine Oats Treating Greysin Medlen/Extender: Linwood Dibbles, HOYT Weeks in Treatment: 25 Active Inactive Abuse / Safety / Falls / Self Care Management Nursing Diagnoses: Potential for falls Goals: Patient will remain injury free related to falls Date Initiated: 03/03/2019 Target Resolution Date: 03/16/2019 Goal Status: Active Interventions: Assess fall risk on admission and as needed Notes: Medication Nursing Diagnoses: Knowledge deficit related to medication safety: actual or potential Goals: Patient/caregiver will demonstrate understanding of all current medications Date Initiated: 03/03/2019 Target Resolution Date: 03/16/2019 Goal Status: Active Interventions: Assess for medication contraindications each visit where new medications are prescribed Treatment Activities: New medication prescribed at Wound  Center : 03/03/2019 Notes: Necrotic Tissue Nursing Diagnoses: Impaired tissue integrity related to necrotic/devitalized tissue Goals: Necrotic/devitalized tissue will be minimized in the wound bed Date Initiated: 03/03/2019 Target Resolution Date: 03/16/2019 Goal Status: Active Interventions: Assess patient pain level pre-, during and post procedure and prior to discharge Treatment Activities: Apply topical anesthetic as ordered : 03/03/2019 Notes: [name] [name], [name] [name] ([id number]) Nutrition Nursing Diagnoses: Potential for alteratiion in Nutrition/Potential for imbalanced nutrition Goals: Patient/caregiver agrees to and verbalizes understanding of need to use nutritional supplements and/or vitamins as prescribed Date Initiated: 03/03/2019 Target Resolution Date: 03/16/2019 Goal Status: Active Interventions: Provide education on nutrition Notes: Pressure Nursing Diagnoses: Knowledge deficit related to management of pressures ulcers Goals: Patient/caregiver will verbalize understanding of pressure ulcer management Date Initiated: 03/03/2019 Target Resolution Date: 03/16/2019 Goal Status: Active Interventions: Assess: immobility, friction, shearing, incontinence upon admission and as needed Provide education on pressure ulcers Treatment Activities: Patient referred for home evaluation of offloading devices/mattresses : 03/03/2019 Patient referred for pressure reduction/relief devices : 03/03/2019 Pressure reduction/relief device ordered : 03/03/2019 Notes: Wound/Skin Impairment Nursing Diagnoses: Impaired tissue integrity Goals: Ulcer/skin breakdown will have a volume reduction of 30% by week 4 Date Initiated: 03/03/2019 Target Resolution Date: 03/03/2019 Goal Status: Active Interventions: Assess ulceration(s) every visit Treatment Activities: Skin care regimen initiated : 03/03/2019 Notes: Electronic Signature(s) Signed: 08/25/2019 1:30:06 PM By: [name] [name] Entered  By: [name] [name] on 08/25/2019 09:14:39 Spittler, Amandeep A. ([id number]) -------------------------------------------------------------------------------- Pain Assessment Details Patient Name: [name] [name] A. Date of Service: 08/25/2019 8:30 AM Medical Record Number: [id number] Patient Account Number: [id number] Date of Birth/Sex: Feb 22, 1929 (86 y.o. F) Treating RN: Curtis Sites Primary Care Joeanne Robicheaux: Dewaine Oats Other Clinician: Referring Ellery Tash: Dewaine Oats Treating Leesa Leifheit/Extender: Linwood Dibbles, HOYT Weeks in Treatment: 25 Active Problems Location of Pain Severity and Description of Pain Patient Has Paino Patient Unable to Respond Site Locations Pain Management and Medication Current Pain Management: Electronic Signature(s) Signed: 08/25/2019 4:13:45 PM By: Curtis Sites Entered By: Curtis Sites on 08/25/2019 08:58:19 [name] [name] ([id number]) -------------------------------------------------------------------------------- Patient/Caregiver Education Details Patient Name: [name] [name] A. Date of Service: 08/25/2019 8:30 AM Medical Record Number: [id number] Patient Account Number: [id number] Date of Birth/Gender: Dec 20, 1928 (86 y.o. F) Treating RN: [name] [name] Primary Care Physician: Dewaine Oats Other Clinician: Referring Physician: Dewaine Oats Treating Physician/Extender: Skeet Simmer in Treatment: 25 Education Assessment Education Provided To: Patient Education Topics Provided Wound/Skin Impairment: Handouts: Caring for Your Ulcer Methods: Demonstration, Explain/Verbal  Responses: State content correctly Electronic Signature(s) Signed: 08/25/2019 1:30:06 PM By: [name] [name] Entered By: [name] [name] on 08/25/2019 09:16:26 [name] [name] A. ([id number]) -------------------------------------------------------------------------------- Wound Assessment Details Patient Name: [name] [name] A. Date of Service: 08/25/2019 8:30 AM Medical Record Number: [id number] Patient Account Number:  [id number] Date of Birth/Sex: 26-Jun-1928 (86 y.o. F) Treating RN: Curtis Sites Primary Care Ezel Vallone: Dewaine Oats Other Clinician: Referring Zaryia Markel: Dewaine Oats Treating Shayla Heming/Extender: STONE III, HOYT Weeks in Treatment: 25 Wound Status Wound Number: 4 Primary Etiology: Pressure Ulcer Wound Location: Midline Sacrum Wound Status: Open Wounding Event: Pressure Injury Comorbid History: Glaucoma, Hypertension, Osteoarthritis, Dementia Date Acquired: 07/18/2018 Weeks Of Treatment: 25 Clustered Wound: Yes Photos Wound Measurements Length: (cm) 4.5 % Width: (cm) 3.6 % Depth: (cm) 2.7 E Clustered Quantity: 1 T Area: (cm) 12.723 Volume: (cm) 34.353 Reduction in Area: 26.4% Reduction in Volume: -1888% pithelialization: Medium (34-66%) unneling: No Undermining: Yes Starting Position (o'clock): 6 Ending Position (o'clock): 12 Maximum Distance: (cm) 2.5 Wound Description Classification: Category/Stage IV F Wound Margin: Indistinct, nonvisible S Exudate Amount: Medium Exudate Type: Serous Exudate Color: amber oul Odor After Cleansing: No lough/Fibrino Yes Wound Bed Granulation Amount: Large (67-100%) Exposed Structure Granulation Quality: Red Fascia Exposed: No Necrotic Amount: Small (1-33%) Fat Layer (Subcutaneous Tissue) Exposed: Yes Necrotic Quality: Adherent Slough Tendon Exposed: No Muscle Exposed: Yes Necrosis of Muscle: No Joint Exposed: No Bone Exposed: No Treatment Notes Wound #4 (Midline Sacrum) Sean, Honesty A. ([id number]) Notes Dakins gauze with ABD and tape to midline sacrum Electronic Signature(s) Signed: 08/25/2019 4:13:45 PM By: Curtis Sites Entered By: Curtis Sites on 08/25/2019 09:03:42 [name] [name] A. ([id number]) -------------------------------------------------------------------------------- Vitals Details Patient Name: [name] [name] A. Date of Service: 08/25/2019 8:30 AM Medical Record Number: [id number] Patient Account Number: [id number] Date  of Birth/Sex: [birth year] (86 y.o. F) Treating RN: Curtis Sites Primary Care Ruthetta Koopmann: Dewaine Oats Other Clinician: Referring Tyffani Foglesong: Dewaine Oats Treating Virgia Kelner/Extender: STONE III, HOYT Weeks in Treatment: 25 Vital Signs Time Taken: 08:57 Temperature (F): 97.8 Height (in): 67 Pulse (bpm): 97 Weight (lbs): 140 Respiratory Rate (breaths/min): 16 Body Mass Index (BMI): 21.9 Blood Pressure (mmHg): 136/79 Reference Range: 80 - 120 mg / dl Electronic Signature(s) Signed: 08/25/2019 4:13:45 PM By: Curtis Sites Entered By: Curtis Sites on 08/25/2019 08:58:10

## 2019-09-01 ENCOUNTER — Encounter: Payer: Medicare Other | Admitting: Physician Assistant

## 2019-09-01 ENCOUNTER — Other Ambulatory Visit: Payer: Self-pay

## 2019-09-01 DIAGNOSIS — L89623 Pressure ulcer of left heel, stage 3: Secondary | ICD-10-CM | POA: Diagnosis not present

## 2019-09-01 NOTE — Progress Notes (Addendum)
Frederick PeersHALL, Triniti A. ([id number]) Visit Report for 09/01/2019 Chief Complaint Document Details Patient Name: [name] [name], [name] Date of Service: 09/01/2019 8:45 AM Medical Record Number: [id number] Patient Account Number: [id number] Date of Birth/Sex: 09/29/1928 (86 y.o. F) Treating RN: Rodell PernaScott, Dajea Primary Care Provider: Dewaine OatsATE, DENNY Other Clinician: Referring Provider: Dewaine OatsATE, DENNY Treating Provider/Extender: Linwood DibblesSTONE III, Cortavius Montesinos Weeks in Treatment: 26 Information Obtained from: Patient Chief Complaint Multiple pressure ulcers Electronic Signature(s) Signed: 09/01/2019 8:56:46 AM By: Lenda KelpStone III, Luian Schumpert PA-C Entered By: Lenda KelpStone III, Maritssa Haughton on 09/01/2019 08:56:46 Brann, Marylouise StacksWILMA A. ([id number]) -------------------------------------------------------------------------------- HPI Details Patient Name: [name] [name], [name] Date of Service: 09/01/2019 8:45 AM Medical Record Number: [id number] Patient Account Number: [id number] Date of Birth/Sex: 11/21/1928 (86 y.o. F) Treating RN: Rodell PernaScott, Dajea Primary Care Provider: Dewaine OatsATE, DENNY Other Clinician: Referring Provider: Dewaine OatsATE, DENNY Treating Provider/Extender: Linwood DibblesSTONE III, Georgiana Spillane Weeks in Treatment: 26 History of Present Illness HPI Description: 03/03/2019 on evaluation today patient presents for initial evaluation here in the clinic today concerning issues that she has been having with wounds which are pressure in nature at multiple locations currently. This is on her left heel, right ankle, left hip/ischial location, and all her to varying degrees of severity and depth. The worst is on the left ischial location. At this time the patient has been tolerating dressing changes although I am not exactly sure the dressings that have been utilized prior to coming in today. She does have some necrotic tissue noted at several locations this can require at least some degree of cleaning prior to application of dressings going forward. The patient does have dementia and unfortunately in recent months  has become increasingly immobile requiring more significant treatment she was just recently moved roughly 2 weeks ago from assisted living to a skilled nursing facility at [facility]. Based on what I am seeing currently these pressure injuries occurred prior to that 2-week time I do not see any new or obvious pressure injury at this point. I discussed with the patient as well as her family member that this does appear to be doing better in my opinion than likely where things started. They are unsure as to whether or not she has an air mattress at the facility I do think that would be something that would be appropriate and good for her to have but at the moment I do not know for sure whether that is already in place if not that definitely is can be 1 of the recommendations. Also think a Roho cushion for her wheelchair would be good as well as Prevalon offloading boots. The patient currently does not have any severe pain although when I was cleaning some of the areas she did note there was some discomfort at several of the locations. She does have a history of hypertension as well. 10/29; this is a patient from [facility] nursing home. She is nonambulatory. She is here for follow-up on 4 different wounds 1 over the left lateral malleolus, the right medial calcaneus, the left posterior hip and I believe a new area over the sacrum. The patient is nonambulatory but apparently eats well. 03/24/2019 on evaluation today patient actually appears to be doing better with regard to her wounds in general based on what I am seeing. Fortunately there is no signs of active infection at this time. No fever chills noted. Overall been very pleased with the progress that seems to be occurring since I last saw her. There is a little bit of debridement That will need to be performed  today. 04/07/2019 on evaluation today patient actually appears to be doing well in some regards based on what I am seeing today.  Fortunately there is no signs of active infection at this time which is good news. No fever chills noted. With that being said the wounds on her foot and ankle region in general have healed at this point. I do not see anything open in that region. With regard to the other 2 wounds in the left ischium and midline sacral region I believe that we may need to make a dressing change at this point to do something a little bit more conducive to cleaning up the wound bed. The patient is in agreement with that plan today. 04/21/2019 on evaluation today patient actually appears to be doing well with regard to her wounds all things considered. Both appear to be much cleaner than what they were previous. Fortunately there is no signs of active infection at this time. Both I think are doing well with the current wound care measures which is the Dakin moistened gauze dressings. 05/05/2019 on evaluation today patient seems to be doing much better in regard to her ischial ulcer unfortunately she still seems to be getting pressure to the sacral region. I think that this is evidenced by the fact that she does have some surrounding deep tissue injury and the fact that the wound is actually measuring larger not better. Fortunately there is no evidence of active infection at this time I do believe the Dakin's is still the best option treatment wise but again I do think the patient needs more appropriate offloading on the sacral region. 06/24/2019 on evaluation today patient appears to be doing well with regard to the wound over the ischium which appears to be completely healed. This is great news. Fortunately there does appear to be some improvement with regard to the sacral wound as well. There still does seem to be evidence of necrotic tissue noted minimally that I was able to gently clean away. With that being said the wound itself seems to be doing quite well which is good news. She seems to be offloaded appropriately in  my opinion at this point. I think she may be ready for a wound VAC. 07/14/2019 on evaluation today patient appears to be doing well with regard to her sacral wound in general although unfortunately she does appear to have some evidence of potential infection there is some odor that seems to be a little stronger than just what would normally be noted with the wound VAC. With that being said I do think we need to address this and though I think the wound VAC is appropriate to continue I think that we do need to see about a culture as well as placing her on some antibiotic therapy at this point. The patient's granddaughter was present during evaluation today as well. 07/28/2019 upon evaluation today patient unfortunately appears to not be doing quite as well as I would like with regard to the wound VAC and really was not applied appropriately. The phone was applied directly to the skin which did cause a little bit of breakdown around the opening of the wound. Subsequently is also unfortunately the case that the foam that was down at the sacral region and that was tracked up and along her back was not actually connected so they touched this obviously inhibits the ability to be able to properly drain fluid from 1 to the other the have to be continuous and touching. With  that being said there was some improvement today in the overall appearance of the wound bed though obviously size wise not much difference today. We are going to get in touch with the facility to try to make sure that they know what they are doing with applying the wound VAC I am also can likely see her for more frequent follow-up for the time being. All this was discussed with the patient's grandson during the office visit today as well. 08/11/19 upon evaluation today patient appears to be doing poorly in regard to her wound at this time. Unfortunately there appears to be infection at this time including blue-green drainage and a poor quality  to the granulation tissue all of which point toward infection she also seems to be much more Schnepf, Haizlee A. ([id number]) painful than we noticed in the past. She is moving around a lot just with very light touch around the area I think that is an indication of discomfort 08/18/2019 upon evaluation today patient appears to be doing excellent in regard to her wound as compared to last evaluation. There is still some odor but she does not have as much of the necrotic tissue nor the significant drainage that I noted at the last visit. There still does appear to be evidence of infection and she still is much more tender than what she was experiencing prior to the infection noted last week. 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 I do believe that she likely is getting need to see infectious disease due to the ongoing nature of this infection. For now would you still have to hold off on reinitiating the wound VAC. 08/25/2019 upon evaluation today patient appears to be doing excellent with regard to the overall appearance of the wound though there is still is an odor and I do think that though the wound VAC would be best for her were not quite at the point of getting that on yet she actually has her appointment with infectious disease this afternoon it sounds like this will be with Dr. Sampson Goon. Fortunately there is no signs of systemic infection at this time. 09/01/2019 upon evaluation today patient appears to be doing excellent at this point in regard to her sacral wound. In fact I do not even notice any odor once everything was cleaned away and I came into the room to evaluate the patient the wound seems to be doing very well. There is excellent granulation, no necrotic tissue, and overall I think the Dakin's solution has done extremely well. I did talk with Dr. Sampson Goon who saw the patient in the interim earlier this week and of note her lab  work and everything else checked out just fine and Dr. Sampson Goon really was not concerned about the possibility of the need for IV antibiotic therapy. Obviously if anything worsens or changes in that may still be a consideration but right now she seems to actually be doing extremely well on all fronts. I think the Dakin's moistened gauze is actually a very good option for her to continue at this point. Electronic Signature(s) Signed: 09/01/2019 9:15:45 AM By: Lenda Kelp PA-C Entered By: Lenda Kelp on 09/01/2019 09:15:45 [name], [name] ([id number]) -------------------------------------------------------------------------------- Physical Exam Details Patient Name: [name] [name] A. Date of Service: 09/01/2019 8:45 AM Medical Record Number: [id number] Patient Account Number: [id number] Date of Birth/Sex: May 28, 1928 (86 y.o. F) Treating RN: Rodell Perna Primary Care Provider: Dewaine Oats Other  Clinician: Referring Provider: TATE, Sharlet Salina Treating Provider/Extender: STONE III, Bogdan Vivona Weeks in Treatment: 70 Constitutional Well-nourished and well-hydrated in no acute distress. Respiratory normal breathing without difficulty. Psychiatric this patient is able to make decisions and demonstrates good insight into disease process. Alert and Oriented x 3. pleasant and cooperative. Notes Patient's wound bed currently showed signs of good granulation at this time. Fortunately there is no evidence of active infection and overall patient seems to be doing extremely well. I do not see any evidence of infection visually and her lab work also was very reassuring according to what Dr. Ola Spurr relayed to me as well. Electronic Signature(s) Signed: 09/01/2019 9:16:04 AM By: Worthy Keeler PA-C Entered By: Worthy Keeler on 09/01/2019 09:16:04 Susa Griffins ([id number]) -------------------------------------------------------------------------------- Physician Orders Details Patient Name: [name] [name]  A. Date of Service: 09/01/2019 8:45 AM Medical Record Number: [id number] Patient Account Number: [id number] Date of Birth/Sex: [birth year] (86 y.o. F) Treating RN: Army Melia Primary Care Provider: Benita Stabile Other Clinician: Referring Provider: Benita Stabile Treating Provider/Extender: Melburn Hake, Andrewjames Weirauch Weeks in Treatment: 57 Verbal / Phone Orders: No Diagnosis Coding ICD-10 Coding Code Description L89.623 Pressure ulcer of left heel, stage 3 L89.513 Pressure ulcer of right ankle, stage 3 L89.224 Pressure ulcer of left hip, stage 4 L89.153 Pressure ulcer of sacral region, stage 3 I10 Essential (primary) hypertension F01.50 Vascular dementia without behavioral disturbance Wound Cleansing Wound #4 Midline Sacrum o Clean wound with Normal Saline. o Cleanse wound with mild soap and water Primary Wound Dressing Wound #4 Midline Sacrum o Other: - Dakins moistened gauze packed Secondary Dressing Wound #4 Midline Sacrum o ABD pad - secure with tape Dressing Change Frequency Wound #4 Midline Sacrum o Change dressing every day. Follow-up Appointments Wound #4 Midline Sacrum o Return Appointment in 2 weeks. Off-Loading Wound #4 Midline Sacrum o Roho cushion for wheelchair - pt needs Roho cushion for her chair o Mattress - Pt needs Air mattress o Turn and reposition every 2 hours - Pt is still getting pressure to her midline sacrum. Please reposition pt EVERY 2 HOURS Negative Pressure Wound Therapy Wound #4 Midline Sacrum o Place NPWT on HOLD. - Until infection is under control Electronic Signature(s) Signed: 09/01/2019 9:44:09 AM By: Army Melia Signed: 09/01/2019 5:42:16 PM By: Worthy Keeler PA-C Entered By: Army Melia on 09/01/2019 09:13:30 Olivier, Darryll Capers A. ([id number]) -------------------------------------------------------------------------------- Problem List Details Patient Name: [name] [name] A. Date of Service: 09/01/2019 8:45 AM Medical Record Number:  [id number] Patient Account Number: [id number] Date of Birth/Sex: [birth year] (86 y.o. F) Treating RN: Army Melia Primary Care Provider: Benita Stabile Other Clinician: Referring Provider: Benita Stabile Treating Provider/Extender: Melburn Hake, Charmine Bockrath Weeks in Treatment: 26 Active Problems ICD-10 Evaluated Encounter Code Description Active Date Today Diagnosis L89.623 Pressure ulcer of left heel, stage 3 03/03/2019 No Yes L89.513 Pressure ulcer of right ankle, stage 3 03/03/2019 No Yes L89.224 Pressure ulcer of left hip, stage 4 03/03/2019 No Yes L89.153 Pressure ulcer of sacral region, stage 3 03/24/2019 No Yes I10 Essential (primary) hypertension 03/03/2019 No Yes F01.50 Vascular dementia without behavioral disturbance 03/03/2019 No Yes Inactive Problems Resolved Problems Electronic Signature(s) Signed: 09/01/2019 8:56:41 AM By: Worthy Keeler PA-C Entered By: Worthy Keeler on 09/01/2019 08:56:40 Barona, Darryll Capers A. ([id number]) -------------------------------------------------------------------------------- Progress Note Details Patient Name: [name] [name] A. Date of Service: 09/01/2019 8:45 AM Medical Record Number: [id number] Patient Account Number: [id number] Date of Birth/Sex: 09-Jul-1928 (86 y.o. F) Treating RN: Army Melia Primary Care Provider: Benita Stabile Other Clinician: Referring  Provider: Dewaine Oats Treating Provider/Extender: Linwood Dibbles, Hosie Sharman Weeks in Treatment: 26 Subjective Chief Complaint Information obtained from Patient Multiple pressure ulcers History of Present Illness (HPI) 03/03/2019 on evaluation today patient presents for initial evaluation here in the clinic today concerning issues that she has been having with wounds which are pressure in nature at multiple locations currently. This is on her left heel, right ankle, left hip/ischial location, and all her to varying degrees of severity and depth. The worst is on the left ischial location. At this time the patient has been  tolerating dressing changes although I am not exactly sure the dressings that have been utilized prior to coming in today. She does have some necrotic tissue noted at several locations this can require at least some degree of cleaning prior to application of dressings going forward. The patient does have dementia and unfortunately in recent months has become increasingly immobile requiring more significant treatment she was just recently moved roughly 2 weeks ago from assisted living to a skilled nursing facility at [facility]. Based on what I am seeing currently these pressure injuries occurred prior to that 2-week time I do not see any new or obvious pressure injury at this point. I discussed with the patient as well as her family member that this does appear to be doing better in my opinion than likely where things started. They are unsure as to whether or not she has an air mattress at the facility I do think that would be something that would be appropriate and good for her to have but at the moment I do not know for sure whether that is already in place if not that definitely is can be 1 of the recommendations. Also think a Roho cushion for her wheelchair would be good as well as Prevalon offloading boots. The patient currently does not have any severe pain although when I was cleaning some of the areas she did note there was some discomfort at several of the locations. She does have a history of hypertension as well. 10/29; this is a patient from [facility] nursing home. She is nonambulatory. She is here for follow-up on 4 different wounds 1 over the left lateral malleolus, the right medial calcaneus, the left posterior hip and I believe a new area over the sacrum. The patient is nonambulatory but apparently eats well. 03/24/2019 on evaluation today patient actually appears to be doing better with regard to her wounds in general based on what I am seeing. Fortunately there is no signs  of active infection at this time. No fever chills noted. Overall been very pleased with the progress that seems to be occurring since I last saw her. There is a little bit of debridement That will need to be performed today. 04/07/2019 on evaluation today patient actually appears to be doing well in some regards based on what I am seeing today. Fortunately there is no signs of active infection at this time which is good news. No fever chills noted. With that being said the wounds on her foot and ankle region in general have healed at this point. I do not see anything open in that region. With regard to the other 2 wounds in the left ischium and midline sacral region I believe that we may need to make a dressing change at this point to do something a little bit more conducive to cleaning up the wound bed. The patient is in agreement with that plan today. 04/21/2019 on evaluation today  patient actually appears to be doing well with regard to her wounds all things considered. Both appear to be much cleaner than what they were previous. Fortunately there is no signs of active infection at this time. Both I think are doing well with the current wound care measures which is the Dakin moistened gauze dressings. 05/05/2019 on evaluation today patient seems to be doing much better in regard to her ischial ulcer unfortunately she still seems to be getting pressure to the sacral region. I think that this is evidenced by the fact that she does have some surrounding deep tissue injury and the fact that the wound is actually measuring larger not better. Fortunately there is no evidence of active infection at this time I do believe the Dakin's is still the best option treatment wise but again I do think the patient needs more appropriate offloading on the sacral region. 06/24/2019 on evaluation today patient appears to be doing well with regard to the wound over the ischium which appears to be completely healed.  This is great news. Fortunately there does appear to be some improvement with regard to the sacral wound as well. There still does seem to be evidence of necrotic tissue noted minimally that I was able to gently clean away. With that being said the wound itself seems to be doing quite well which is good news. She seems to be offloaded appropriately in my opinion at this point. I think she may be ready for a wound VAC. 07/14/2019 on evaluation today patient appears to be doing well with regard to her sacral wound in general although unfortunately she does appear to have some evidence of potential infection there is some odor that seems to be a little stronger than just what would normally be noted with the wound VAC. With that being said I do think we need to address this and though I think the wound VAC is appropriate to continue I think that we do need to see about a culture as well as placing her on some antibiotic therapy at this point. The patient's granddaughter was present during evaluation today as well. 07/28/2019 upon evaluation today patient unfortunately appears to not be doing quite as well as I would like with regard to the wound VAC and really was not applied appropriately. The phone was applied directly to the skin which did cause a little bit of breakdown around the opening of the wound. Subsequently is also unfortunately the case that the foam that was down at the sacral region and that was tracked up and along her back was not actually connected so they touched this obviously inhibits the ability to be able to properly drain fluid from 1 to the other the have to be continuous and touching. With that being said there was some improvement today in the overall appearance of the wound bed though obviously size wise not much difference today. We are going to get in touch with the facility to try to make sure that they know what they are doing with applying the wound VAC I am [facility], [facility] A.  ([id number]) also can likely see her for more frequent follow-up for the time being. All this was discussed with the patient's grandson during the office visit today as well. 08/11/19 upon evaluation today patient appears to be doing poorly in regard to her wound at this time. Unfortunately there appears to be infection at this time including blue-green drainage and a poor quality to the granulation tissue all of  which point toward infection she also seems to be much more painful than we noticed in the past. She is moving around a lot just with very light touch around the area I think that is an indication of discomfort 08/18/2019 upon evaluation today patient appears to be doing excellent in regard to her wound as compared to last evaluation. There is still some odor but she does not have as much of the necrotic tissue nor the significant drainage that I noted at the last visit. There still does appear to be evidence of infection and she still is much more tender than what she was experiencing prior to the infection noted last week. 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 I do believe that she likely is getting need to see infectious disease due to the ongoing nature of this infection. For now would you still have to hold off on reinitiating the wound VAC. 08/25/2019 upon evaluation today patient appears to be doing excellent with regard to the overall appearance of the wound though there is still is an odor and I do think that though the wound VAC would be best for her were not quite at the point of getting that on yet she actually has her appointment with infectious disease this afternoon it sounds like this will be with Dr. Sampson Goon. Fortunately there is no signs of systemic infection at this time. 09/01/2019 upon evaluation today patient appears to be doing excellent at this point in regard to her sacral wound. In fact I do not even notice  any odor once everything was cleaned away and I came into the room to evaluate the patient the wound seems to be doing very well. There is excellent granulation, no necrotic tissue, and overall I think the Dakin's solution has done extremely well. I did talk with Dr. Sampson Goon who saw the patient in the interim earlier this week and of note her lab work and everything else checked out just fine and Dr. Sampson Goon really was not concerned about the possibility of the need for IV antibiotic therapy. Obviously if anything worsens or changes in that may still be a consideration but right now she seems to actually be doing extremely well on all fronts. I think the Dakin's moistened gauze is actually a very good option for her to continue at this point. Objective Constitutional Well-nourished and well-hydrated in no acute distress. Vitals Time Taken: 8:43 AM, Height: 67 in, Weight: 140 lbs, BMI: 21.9, Temperature: 98.0 F, Pulse: 81 bpm, Respiratory Rate: 16 breaths/min, Blood Pressure: 153/76 mmHg. Respiratory normal breathing without difficulty. Psychiatric this patient is able to make decisions and demonstrates good insight into disease process. Alert and Oriented x 3. pleasant and cooperative. General Notes: Patient's wound bed currently showed signs of good granulation at this time. Fortunately there is no evidence of active infection and overall patient seems to be doing extremely well. I do not see any evidence of infection visually and her lab work also was very reassuring according to what Dr. Sampson Goon relayed to me as well. Integumentary (Hair, Skin) Wound #4 status is Open. Original cause of wound was Pressure Injury. The wound is located on the Midline Sacrum. The wound measures 3.5cm length x 2.9cm width x 2.3cm depth; 7.972cm^2 area and 18.335cm^3 volume. There is Fat Layer (Subcutaneous Tissue) Exposed exposed. There is no tunneling noted, however, there is undermining starting at  6:00 and ending at 12:00 with a maximum  distance of 2.1cm. There is a medium amount of serous drainage noted. The wound margin is indistinct and nonvisible. There is large (67-100%) red granulation within the wound bed. There is a small (1-33%) amount of necrotic tissue within the wound bed including Adherent Slough. Assessment Active Problems ICD-10 Pressure ulcer of left heel, stage 3 Pressure ulcer of right ankle, stage 3 Pressure ulcer of left hip, stage 4 Keegan, Hadlyn A. ([id number]) Pressure ulcer of sacral region, stage 3 Essential (primary) hypertension Vascular dementia without behavioral disturbance Plan Wound Cleansing: Wound #4 Midline Sacrum: Clean wound with Normal Saline. Cleanse wound with mild soap and water Primary Wound Dressing: Wound #4 Midline Sacrum: Other: - Dakins moistened gauze packed Secondary Dressing: Wound #4 Midline Sacrum: ABD pad - secure with tape Dressing Change Frequency: Wound #4 Midline Sacrum: Change dressing every day. Follow-up Appointments: Wound #4 Midline Sacrum: Return Appointment in 2 weeks. Off-Loading: Wound #4 Midline Sacrum: Roho cushion for wheelchair - pt needs Roho cushion for her chair Mattress - Pt needs Air mattress Turn and reposition every 2 hours - Pt is still getting pressure to her midline sacrum. Please reposition pt EVERY 2 HOURS Negative Pressure Wound Therapy: Wound #4 Midline Sacrum: Place NPWT on HOLD. - Until infection is under control 1 my suggestion based on what we are seeing right now is can be that we just hold off on any IV antibiotic therapy per Dr. Jarrett Ables recommendation. I think that we will continue with the Dakin's moistened gauze packing for the wound bed is that seems to be doing very well for her and things are moving in the right direction. 2. I am also can recommend that we continue to monitor for any signs of worsening or changing infection obviously if there is anything that shows up we  will address it. 3. I am also can recommend appropriate and continued offloading to be of utmost importance with regard to the patient's wound. 4. At this point I would just recommend that we discontinue the negative pressure wound therapy. We will see patient back for reevaluation in 1 week here in the clinic. If anything worsens or changes patient will contact our office for additional recommendations. Electronic Signature(s) Signed: 09/01/2019 9:17:00 AM By: Lenda Kelp PA-C Entered By: Lenda Kelp on 09/01/2019 09:16:59 [name], [name] ([id number]) -------------------------------------------------------------------------------- SuperBill Details Patient Name: [name] [name] A. Date of Service: 09/01/2019 Medical Record Number: [id number] Patient Account Number: [id number] Date of Birth/Sex: Jul 30, 1928 (86 y.o. F) Treating RN: Rodell Perna Primary Care Provider: Dewaine Oats Other Clinician: Referring Provider: Dewaine Oats Treating Provider/Extender: Linwood Dibbles, Latia Mataya Weeks in Treatment: 26 Diagnosis Coding ICD-10 Codes Code Description [id number] Pressure ulcer of left heel, stage 3 L89.513 Pressure ulcer of right ankle, stage 3 L89.224 Pressure ulcer of left hip, stage 4 L89.153 Pressure ulcer of sacral region, stage 3 I10 Essential (primary) hypertension F01.50 Vascular dementia without behavioral disturbance Facility Procedures CPT4 Code: [id number] Description: 99213 - WOUND CARE VISIT-LEV 3 EST PT Modifier: Quantity: 1 Physician Procedures CPT4 Code: [id number] Description: 99213 - WC PHYS LEVEL 3 - EST PT Modifier: Quantity: 1 CPT4 Code: Description: ICD-10 Diagnosis Description L89.623 Pressure ulcer of left heel, stage 3 L89.513 Pressure ulcer of right ankle, stage 3 L89.224 Pressure ulcer of left hip, stage 4 L89.153 Pressure ulcer of sacral region, stage 3 Modifier: Quantity: Electronic Signature(s) Signed: 09/01/2019 9:17:21 AM By: Lenda Kelp PA-C Entered By: Lenda Kelp on 09/01/2019 09:17:21

## 2019-09-15 ENCOUNTER — Encounter: Payer: Medicare Other | Admitting: Physician Assistant

## 2019-09-15 ENCOUNTER — Other Ambulatory Visit: Payer: Self-pay

## 2019-09-15 DIAGNOSIS — L89623 Pressure ulcer of left heel, stage 3: Secondary | ICD-10-CM | POA: Diagnosis not present

## 2019-09-15 NOTE — Progress Notes (Addendum)
PASTY, MANNINEN ([id number]) Visit Report for 09/15/2019 Chief Complaint Document Details Patient Name: [name] [name], [name] [name]. Date of Service: 09/15/2019 8:30 AM Medical Record Number: [id number] Patient Account Number: [id number] Date of Birth/Sex: 06/07/1928 (86 y.o. F) Treating RN: Rodell Perna Primary Care Provider: Dewaine Oats Other Clinician: Referring Provider: Dewaine Oats Treating Provider/Extender: Linwood Dibbles, Lotus Santillo Weeks in Treatment: 28 Information Obtained from: Patient Chief Complaint Multiple pressure ulcers Electronic Signature(s) Signed: 09/15/2019 9:04:03 AM By: Lenda Kelp PA-C Entered By: Lenda Kelp on 09/15/2019 09:04:02 [name], Marylouise Stacks [name]. ([id number]) -------------------------------------------------------------------------------- HPI Details Patient Name: [name] [name] [name]. Date of Service: 09/15/2019 8:30 AM Medical Record Number: [id number] Patient Account Number: [id number] Date of Birth/Sex: 11-04-1928 (86 y.o. F) Treating RN: Rodell Perna Primary Care Provider: Dewaine Oats Other Clinician: Referring Provider: Dewaine Oats Treating Provider/Extender: Linwood Dibbles, Joran Kallal Weeks in Treatment: 28 History of Present Illness HPI Description: 03/03/2019 on evaluation today patient presents for initial evaluation here in the clinic today concerning issues that she has been having with wounds which are pressure in nature at multiple locations currently. This is on her left heel, right ankle, left hip/ischial location, and all her to varying degrees of severity and depth. The worst is on the left ischial location. At this time the patient has been tolerating dressing changes although I am not exactly sure the dressings that have been utilized prior to coming in today. She does have some necrotic tissue noted at several locations this can require at least some degree of cleaning prior to application of dressings going forward. The patient does have dementia and unfortunately in recent months  has become increasingly immobile requiring more significant treatment she was just recently moved roughly 2 weeks ago from assisted living to [name] skilled nursing facility at [facility]. Based on what I am seeing currently these pressure injuries occurred prior to that 2-week time I do not see any new or obvious pressure injury at this point. I discussed with the patient as well as her family member that this does appear to be doing better in my opinion than likely where things started. They are unsure as to whether or not she has an air mattress at the facility I do think that would be something that would be appropriate and good for her to have but at the moment I do not know for sure whether that is already in place if not that definitely is can be 1 of the recommendations. Also think [name] Roho cushion for her wheelchair would be good as well as Prevalon offloading boots. The patient currently does not have any severe pain although when I was cleaning some of the areas she did note there was some discomfort at several of the locations. She does have [name] history of hypertension as well. 10/29; this is [name] patient from [facility] nursing home. She is nonambulatory. She is here for follow-up on 4 different wounds 1 over the left lateral malleolus, the right medial calcaneus, the left posterior hip and I believe [name] new area over the sacrum. The patient is nonambulatory but apparently eats well. 03/24/2019 on evaluation today patient actually appears to be doing better with regard to her wounds in general based on what I am seeing. Fortunately there is no signs of active infection at this time. No fever chills noted. Overall been very pleased with the progress that seems to be occurring since I last saw her. There is [name] little bit of debridement That will need to be performed  today. 04/07/2019 on evaluation today patient actually appears to be doing well in some regards based on what I am seeing today.  Fortunately there is no signs of active infection at this time which is good news. No fever chills noted. With that being said the wounds on her foot and ankle region in general have healed at this point. I do not see anything open in that region. With regard to the other 2 wounds in the left ischium and midline sacral region I believe that we may need to make [name] dressing change at this point to do something [name] little bit more conducive to cleaning up the wound bed. The patient is in agreement with that plan today. 04/21/2019 on evaluation today patient actually appears to be doing well with regard to her wounds all things considered. Both appear to be much cleaner than what they were previous. Fortunately there is no signs of active infection at this time. Both I think are doing well with the current wound care measures which is the Dakin moistened gauze dressings. 05/05/2019 on evaluation today patient seems to be doing much better in regard to her ischial ulcer unfortunately she still seems to be getting pressure to the sacral region. I think that this is evidenced by the fact that she does have some surrounding deep tissue injury and the fact that the wound is actually measuring larger not better. Fortunately there is no evidence of active infection at this time I do believe the Dakin's is still the best option treatment wise but again I do think the patient needs more appropriate offloading on the sacral region. 06/24/2019 on evaluation today patient appears to be doing well with regard to the wound over the ischium which appears to be completely healed. This is great news. Fortunately there does appear to be some improvement with regard to the sacral wound as well. There still does seem to be evidence of necrotic tissue noted minimally that I was able to gently clean away. With that being said the wound itself seems to be doing quite well which is good news. She seems to be offloaded appropriately in  my opinion at this point. I think she may be ready for [name] wound VAC. 07/14/2019 on evaluation today patient appears to be doing well with regard to her sacral wound in general although unfortunately she does appear to have some evidence of potential infection there is some odor that seems to be [name] little stronger than just what would normally be noted with the wound VAC. With that being said I do think we need to address this and though I think the wound VAC is appropriate to continue I think that we do need to see about [name] culture as well as placing her on some antibiotic therapy at this point. The patient's granddaughter was present during evaluation today as well. 07/28/2019 upon evaluation today patient unfortunately appears to not be doing quite as well as I would like with regard to the wound VAC and really was not applied appropriately. The phone was applied directly to the skin which did cause [name] little bit of breakdown around the opening of the wound. Subsequently is also unfortunately the case that the foam that was down at the sacral region and that was tracked up and along her back was not actually connected so they touched this obviously inhibits the ability to be able to properly drain fluid from 1 to the other the have to be continuous and touching. With  that being said there was some improvement today in the overall appearance of the wound bed though obviously size wise not much difference today. We are going to get in touch with the facility to try to make sure that they know what they are doing with applying the wound VAC I am also can likely see her for more frequent follow-up for the time being. All this was discussed with the patient's grandson during the office visit today as well. 08/11/19 upon evaluation today patient appears to be doing poorly in regard to her wound at this time. Unfortunately there appears to be infection at this time including blue-green drainage and [name] poor quality  to the granulation tissue all of which point toward infection she also seems to be much more painful than we noticed in the past. She is moving around [name] lot just with very light touch around the area I think that is an indication of discomfort 08/18/2019 upon evaluation today patient appears to be doing excellent in regard to her wound as compared to last evaluation. There is still some odor but she does not have as much of the necrotic tissue nor the significant drainage that I noted at the last visit. There still does appear to be evidence of infection and she still is much more tender than what she was experiencing prior to the infection noted last week. 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 I do believe that she likely is getting need to see infectious Posey, Trease [name]. ([id number]) disease due to the ongoing nature of this infection. For now would you still have to hold off on reinitiating the wound VAC. 08/25/2019 upon evaluation today patient appears to be doing excellent with regard to the overall appearance of the wound though there is still is an odor and I do think that though the wound VAC would be best for her were not quite at the point of getting that on yet she actually has her appointment with infectious disease this afternoon it sounds like this will be with Dr. Ola Spurr. Fortunately there is no signs of systemic infection at this time. 09/01/2019 upon evaluation today patient appears to be doing excellent at this point in regard to her sacral wound. In fact I do not even notice any odor once everything was cleaned away and I came into the room to evaluate the patient the wound seems to be doing very well. There is excellent granulation, no necrotic tissue, and overall I think the Dakin's solution has done extremely well. I did talk with Dr. Ola Spurr who saw the patient in the interim earlier this week and of note her lab  work and everything else checked out just fine and Dr. Ola Spurr really was not concerned about the possibility of the need for IV antibiotic therapy. Obviously if anything worsens or changes in that may still be [name] consideration but right now she seems to actually be doing extremely well on all fronts. I think the Dakin's moistened gauze is actually [name] very good option for her to continue at this point. 09/15/2019 upon evaluation today patient appears to be doing well with regard to her wound in general. Fortunately there is no signs of significant infection overall I feel like she is doing excellent in that regard. With that being said I think the Dakin's moistened gauze packing has done extremely well for her Electronic Signature(s) Signed: 09/15/2019 9:28:52 AM By: Worthy Keeler  PA-C Entered By: Lenda Kelp on 09/15/2019 09:28:52 [name] ([id number]) -------------------------------------------------------------------------------- Physical Exam Details Patient Name: [name] [name], [name] [name]. Date of Service: 09/15/2019 8:30 AM Medical Record Number: [id number] Patient Account Number: [id number] Date of Birth/Sex: March 19, 1929 (86 y.o. F) Treating RN: Rodell Perna Primary Care Provider: Dewaine Oats Other Clinician: Referring Provider: TATE, Katherina Right Treating Provider/Extender: STONE III, Murna Backer Weeks in Treatment: 28 Constitutional Well-nourished and well-hydrated in no acute distress. Respiratory normal breathing without difficulty. Psychiatric this patient is able to make decisions and demonstrates good insight into disease process. Alert and Oriented x 3. pleasant and cooperative. Notes Upon inspection patient's wound actually showed signs of good granulation that seems to be less deep and overall I think he has healing nicely considering the size of the wound. With that being said on the left border there does appear to be some pressure injury which I think is getting need to be managed as  well. Electronic Signature(s) Signed: 09/15/2019 9:29:19 AM By: Lenda Kelp PA-C Entered By: Lenda Kelp on 09/15/2019 09:29:18 [name] ([id number]) -------------------------------------------------------------------------------- Physician Orders Details Patient Name: [name] [name] [name]. Date of Service: 09/15/2019 8:30 AM Medical Record Number: [id number] Patient Account Number: [id number] Date of Birth/Sex: Sep 27, 1928 (86 y.o. F) Treating RN: Rodell Perna Primary Care Provider: Dewaine Oats Other Clinician: Referring Provider: Dewaine Oats Treating Provider/Extender: Linwood Dibbles, Cici Rodriges Weeks in Treatment: 28 Verbal / Phone Orders: No Diagnosis Coding ICD-10 Coding Code Description L89.623 Pressure ulcer of left heel, stage 3 L89.513 Pressure ulcer of right ankle, stage 3 L89.224 Pressure ulcer of left hip, stage 4 L89.153 Pressure ulcer of sacral region, stage 3 I10 Essential (primary) hypertension F01.50 Vascular dementia without behavioral disturbance Wound Cleansing Wound #4 Midline Sacrum o Clean wound with Normal Saline. o Cleanse wound with mild soap and water Primary Wound Dressing Wound #4 Midline Sacrum o Other: - Dakins moistened gauze packed Secondary Dressing Wound #4 Midline Sacrum o ABD pad - secure with tape Dressing Change Frequency Wound #4 Midline Sacrum o Change dressing every day. Follow-up Appointments Wound #4 Midline Sacrum o Return Appointment in 2 weeks. Off-Loading Wound #4 Midline Sacrum o Roho cushion for wheelchair - pt needs Roho cushion for her chair o Mattress - Pt needs Air mattress o Turn and reposition every 2 hours - Pt is still getting pressure to her midline sacrum. Please reposition pt EVERY 2 HOURS Negative Pressure Wound Therapy Wound #4 Midline Sacrum o Place NPWT on HOLD. - Until infection is under control Electronic Signature(s) Signed: 09/15/2019 5:56:59 PM By: Lenda Kelp PA-C Entered By: Lenda Kelp on 09/15/2019 09:16:06 Prinsen, Marylouise Stacks [name]. ([id number]) -------------------------------------------------------------------------------- Problem List Details Patient Name: [name] [name] [name]. Date of Service: 09/15/2019 8:30 AM Medical Record Number: [id number] Patient Account Number: [id number] Date of Birth/Sex: 1928-08-05 (86 y.o. F) Treating RN: Rodell Perna Primary Care Provider: Dewaine Oats Other Clinician: Referring Provider: Dewaine Oats Treating Provider/Extender: Linwood Dibbles, Abagael Kramm Weeks in Treatment: 28 Active Problems ICD-10 Encounter Code Description Active Date MDM Diagnosis L89.623 Pressure ulcer of left heel, stage 3 03/03/2019 No Yes L89.513 Pressure ulcer of right ankle, stage 3 03/03/2019 No Yes L89.224 Pressure ulcer of left hip, stage 4 03/03/2019 No Yes L89.153 Pressure ulcer of sacral region, stage 3 03/24/2019 No Yes I10 Essential (primary) hypertension 03/03/2019 No Yes F01.50 Vascular dementia without behavioral disturbance 03/03/2019 No Yes Inactive Problems Resolved Problems Electronic Signature(s) Signed: 09/15/2019 9:03:56 AM By: Lenda Kelp PA-C Entered By: Lenda Kelp on 09/15/2019  09:03:55 MONCHEL, POLLITT AMarland Kitchen ([id number]) -------------------------------------------------------------------------------- Progress Note Details Patient Name: [name] [name], [name] [name]. Date of Service: 09/15/2019 8:30 AM Medical Record Number: [id number] Patient Account Number: [id number] Date of Birth/Sex: 03-29-1929 (86 y.o. F) Treating RN: Rodell Perna Primary Care Provider: Dewaine Oats Other Clinician: Referring Provider: Dewaine Oats Treating Provider/Extender: Linwood Dibbles, Breckin Savannah Weeks in Treatment: 28 Subjective Chief Complaint Information obtained from Patient Multiple pressure ulcers History of Present Illness (HPI) 03/03/2019 on evaluation today patient presents for initial evaluation here in the clinic today concerning issues that she has been having with wounds which are  pressure in nature at multiple locations currently. This is on her left heel, right ankle, left hip/ischial location, and all her to varying degrees of severity and depth. The worst is on the left ischial location. At this time the patient has been tolerating dressing changes although I am not exactly sure the dressings that have been utilized prior to coming in today. She does have some necrotic tissue noted at several locations this can require at least some degree of cleaning prior to application of dressings going forward. The patient does have dementia and unfortunately in recent months has become increasingly immobile requiring more significant treatment she was just recently moved roughly 2 weeks ago from assisted living to [name] skilled nursing facility at [facility]. Based on what I am seeing currently these pressure injuries occurred prior to that 2-week time I do not see any new or obvious pressure injury at this point. I discussed with the patient as well as her family member that this does appear to be doing better in my opinion than likely where things started. They are unsure as to whether or not she has an air mattress at the facility I do think that would be something that would be appropriate and good for her to have but at the moment I do not know for sure whether that is already in place if not that definitely is can be 1 of the recommendations. Also think [name] Roho cushion for her wheelchair would be good as well as Prevalon offloading boots. The patient currently does not have any severe pain although when I was cleaning some of the areas she did note there was some discomfort at several of the locations. She does have [name] history of hypertension as well. 10/29; this is [name] patient from [facility] nursing home. She is nonambulatory. She is here for follow-up on 4 different wounds 1 over the left lateral malleolus, the right medial calcaneus, the left posterior hip and I believe [name]  new area over the sacrum. The patient is nonambulatory but apparently eats well. 03/24/2019 on evaluation today patient actually appears to be doing better with regard to her wounds in general based on what I am seeing. Fortunately there is no signs of active infection at this time. No fever chills noted. Overall been very pleased with the progress that seems to be occurring since I last saw her. There is [name] little bit of debridement That will need to be performed today. 04/07/2019 on evaluation today patient actually appears to be doing well in some regards based on what I am seeing today. Fortunately there is no signs of active infection at this time which is good news. No fever chills noted. With that being said the wounds on her foot and ankle region in general have healed at this point. I do not see anything open in that region. With regard to the other 2 wounds in the  left ischium and midline sacral region I believe that we may need to make [name] dressing change at this point to do something [name] little bit more conducive to cleaning up the wound bed. The patient is in agreement with that plan today. 04/21/2019 on evaluation today patient actually appears to be doing well with regard to her wounds all things considered. Both appear to be much cleaner than what they were previous. Fortunately there is no signs of active infection at this time. Both I think are doing well with the current wound care measures which is the Dakin moistened gauze dressings. 05/05/2019 on evaluation today patient seems to be doing much better in regard to her ischial ulcer unfortunately she still seems to be getting pressure to the sacral region. I think that this is evidenced by the fact that she does have some surrounding deep tissue injury and the fact that the wound is actually measuring larger not better. Fortunately there is no evidence of active infection at this time I do believe the Dakin's is still the best option  treatment wise but again I do think the patient needs more appropriate offloading on the sacral region. 06/24/2019 on evaluation today patient appears to be doing well with regard to the wound over the ischium which appears to be completely healed. This is great news. Fortunately there does appear to be some improvement with regard to the sacral wound as well. There still does seem to be evidence of necrotic tissue noted minimally that I was able to gently clean away. With that being said the wound itself seems to be doing quite well which is good news. She seems to be offloaded appropriately in my opinion at this point. I think she may be ready for [name] wound VAC. 07/14/2019 on evaluation today patient appears to be doing well with regard to her sacral wound in general although unfortunately she does appear to have some evidence of potential infection there is some odor that seems to be [name] little stronger than just what would normally be noted with the wound VAC. With that being said I do think we need to address this and though I think the wound VAC is appropriate to continue I think that we do need to see about [name] culture as well as placing her on some antibiotic therapy at this point. The patient's granddaughter was present during evaluation today as well. 07/28/2019 upon evaluation today patient unfortunately appears to not be doing quite as well as I would like with regard to the wound VAC and really was not applied appropriately. The phone was applied directly to the skin which did cause [name] little bit of breakdown around the opening of the wound. Subsequently is also unfortunately the case that the foam that was down at the sacral region and that was tracked up and along her back was not actually connected so they touched this obviously inhibits the ability to be able to properly drain fluid from 1 to the other the have to be continuous and touching. With that being said there was some improvement today  in the overall appearance of the wound bed though obviously size wise not much difference today. We are going to get in touch with the facility to try to make sure that they know what they are doing with applying the wound VAC I am also can likely see her for more frequent follow-up for the time being. All this was discussed with the patient's grandson during the office visit  today as well. 08/11/19 upon evaluation today patient appears to be doing poorly in regard to her wound at this time. Unfortunately there appears to be infection at this time including blue-green drainage and [name] poor quality to the granulation tissue all of which point toward infection she also seems to be much more painful than we noticed in the past. She is moving around [name] lot just with very light touch around the area I think that is an indication of discomfort Baker, [name] [name]. ([id number]) 08/18/2019 upon evaluation today patient appears to be doing excellent in regard to her wound as compared to last evaluation. There is still some odor but she does not have as much of the necrotic tissue nor the significant drainage that I noted at the last visit. There still does appear to be evidence of infection and she still is much more tender than what she was experiencing prior to the infection noted last week. 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 I do believe that she likely is getting need to see infectious disease due to the ongoing nature of this infection. For now would you still have to hold off on reinitiating the wound VAC. 08/25/2019 upon evaluation today patient appears to be doing excellent with regard to the overall appearance of the wound though there is still is an odor and I do think that though the wound VAC would be best for her were not quite at the point of getting that on yet she actually has her appointment with infectious disease this afternoon it sounds  like this will be with Dr. Sampson Goon. Fortunately there is no signs of systemic infection at this time. 09/01/2019 upon evaluation today patient appears to be doing excellent at this point in regard to her sacral wound. In fact I do not even notice any odor once everything was cleaned away and I came into the room to evaluate the patient the wound seems to be doing very well. There is excellent granulation, no necrotic tissue, and overall I think the Dakin's solution has done extremely well. I did talk with Dr. Sampson Goon who saw the patient in the interim earlier this week and of note her lab work and everything else checked out just fine and Dr. Sampson Goon really was not concerned about the possibility of the need for IV antibiotic therapy. Obviously if anything worsens or changes in that may still be [name] consideration but right now she seems to actually be doing extremely well on all fronts. I think the Dakin's moistened gauze is actually [name] very good option for her to continue at this point. 09/15/2019 upon evaluation today patient appears to be doing well with regard to her wound in general. Fortunately there is no signs of significant infection overall I feel like she is doing excellent in that regard. With that being said I think the Dakin's moistened gauze packing has done extremely well for her Objective Constitutional Well-nourished and well-hydrated in no acute distress. Vitals Time Taken: 9:00 AM, Height: 67 in, Weight: 140 lbs, BMI: 21.9, Temperature: 98.9 F, Pulse: 112 bpm, Respiratory Rate: 16 breaths/min, Blood Pressure: 118/56 mmHg. Respiratory normal breathing without difficulty. Psychiatric this patient is able to make decisions and demonstrates good insight into disease process. Alert and Oriented x 3. pleasant and cooperative. General Notes: Upon inspection patient's wound actually showed signs of good granulation that seems to be less deep and overall I think he has healing  nicely considering the size of the wound. With that being said on the left border there does appear to be some pressure injury which I think is getting need to be managed as well. Integumentary (Hair, Skin) Wound #4 status is Open. Original cause of wound was Pressure Injury. The wound is located on the Midline Sacrum. The wound measures 3.8cm length x 3.3cm width x 2.5cm depth; 9.849cm^2 area and 24.622cm^3 volume. There is Fat Layer (Subcutaneous Tissue) Exposed exposed. There is [name] medium amount of serous drainage noted. The wound margin is indistinct and nonvisible. There is large (67-100%) red granulation within the wound bed. There is [name] small (1-33%) amount of necrotic tissue within the wound bed including Adherent Slough. Assessment Active Problems ICD-10 Pressure ulcer of left heel, stage 3 Pressure ulcer of right ankle, stage 3 Pressure ulcer of left hip, stage 4 Pressure ulcer of sacral region, stage 3 Essential (primary) hypertension Vascular dementia without behavioral disturbance Hesse, [name] [name]. ([id number]) Plan Wound Cleansing: Wound #4 Midline Sacrum: Clean wound with Normal Saline. Cleanse wound with mild soap and water Primary Wound Dressing: Wound #4 Midline Sacrum: Other: - Dakins moistened gauze packed Secondary Dressing: Wound #4 Midline Sacrum: ABD pad - secure with tape Dressing Change Frequency: Wound #4 Midline Sacrum: Change dressing every day. Follow-up Appointments: Wound #4 Midline Sacrum: Return Appointment in 2 weeks. Off-Loading: Wound #4 Midline Sacrum: Roho cushion for wheelchair - pt needs Roho cushion for her chair Mattress - Pt needs Air mattress Turn and reposition every 2 hours - Pt is still getting pressure to her midline sacrum. Please reposition pt EVERY 2 HOURS Negative Pressure Wound Therapy: Wound #4 Midline Sacrum: Place NPWT on HOLD. - Until infection is under control 1. I would recommend currently that we go ahead and initiate  treatment with [name] continuation of the Dakin's moistened gauze dressing I think this is still the best option for the patient at this point. 2. I am also can recommend that we go ahead and continue with the appropriate offloading she needs to be repositioned every 2 hours I reiterated this on the orders that I sent back to the facility today as well. We will see patient back for reevaluation in 2 weeks here in the clinic. If anything worsens or changes patient will contact our office for additional recommendations. Electronic Signature(s) Signed: 09/15/2019 9:30:24 AM By: Lenda Kelp PA-C Entered By: Lenda Kelp on 09/15/2019 09:30:24 [name], [name] ([id number]) -------------------------------------------------------------------------------- SuperBill Details Patient Name: [name] [name] [name]. Date of Service: 09/15/2019 Medical Record Number: [id number] Patient Account Number: [id number] Date of Birth/Sex: 04-23-1929 (86 y.o. F) Treating RN: Rodell Perna Primary Care Provider: Dewaine Oats Other Clinician: Referring Provider: Dewaine Oats Treating Provider/Extender: Linwood Dibbles, Dorismar Chay Weeks in Treatment: 28 Diagnosis Coding ICD-10 Codes Code Description [phone] Pressure ulcer of left heel, stage 3 L89.513 Pressure ulcer of right ankle, stage 3 L89.224 Pressure ulcer of left hip, stage 4 L89.153 Pressure ulcer of sacral region, stage 3 I10 Essential (primary) hypertension F01.50 Vascular dementia without behavioral disturbance Facility Procedures CPT4 Code: [id number] Description: 99213 - WOUND CARE VISIT-LEV 3 EST PT Modifier: Quantity: 1 Physician Procedures CPT4 Code: [id number] Description: 99214 - WC PHYS LEVEL 4 - EST PT Modifier: Quantity: 1 CPT4 Code: Description: ICD-10 Diagnosis Description L89.623 Pressure ulcer of left heel, stage 3 L89.513 Pressure ulcer of right ankle, stage 3 L89.224 Pressure ulcer of left hip, stage 4 L89.153 Pressure ulcer of sacral region, stage  3 Modifier: Quantity: Electronic Signature(s) Signed:  09/15/2019 9:30:52 AM By: Lenda KelpStone III, [place] Scheiderer PA-C Previous Signature: 09/15/2019 9:30:37 AM Version By: Lenda KelpStone III, Demeisha Geraghty PA-C Entered By: Lenda KelpStone III, Dekker Verga on 09/15/2019 09:30:52

## 2019-09-16 NOTE — Progress Notes (Signed)
HA, SHANNAHAN ([id number]) Visit Report for 09/15/2019 Arrival Information Details Patient Name: LAQUEENA, HINCHEY A. Date of Service: 09/15/2019 8:30 AM Medical Record Number: [id number] Patient Account Number: [id number] Date of Birth/Sex: August 03, 1928 (86 y.o. F) Treating RN: Rodell Perna Primary Care Taray Normoyle: Dewaine Oats Other Clinician: Referring Taeden Geller: Dewaine Oats Treating Leodis Alcocer/Extender: Linwood Dibbles, HOYT Weeks in Treatment: 28 Visit Information History Since Last Visit Added or deleted any medications: No Patient Arrived: Wheel Chair Any new allergies or adverse reactions: No Arrival Time: 09:00 Had a fall or experienced change in No Accompanied By: family activities of daily living that may affect Transfer Assistance: Michiel Sites Lift risk of falls: Patient Identification Verified: Yes Signs or symptoms of abuse/neglect since last visito No Hospitalized since last visit: No Has Dressing in Place as Prescribed: Yes Pain Present Now: No Electronic Signature(s) Signed: 09/15/2019 12:47:05 PM By: Rodell Perna Entered By: Rodell Perna on 09/15/2019 09:00:27 [name] ([id number]) -------------------------------------------------------------------------------- Clinic Level of Care Assessment Details Patient Name: [name] Date of Service: 09/15/2019 8:30 AM Medical Record Number: [id number] Patient Account Number: [id number] Date of Birth/Sex: Feb 01, 1929 (86 y.o. F) Treating RN: Rodell Perna Primary Care Rulon Abdalla: Dewaine Oats Other Clinician: Referring Jeovanni Heuring: Dewaine Oats Treating Lynise Porr/Extender: Linwood Dibbles, HOYT Weeks in Treatment: 28 Clinic Level of Care Assessment Items TOOL 4 Quantity Score []  - Use when only an EandM is performed on FOLLOW-UP visit 0 ASSESSMENTS - Nursing Assessment / Reassessment X - Reassessment of Co-morbidities (includes updates in patient status) 1 10 X- 1 5 Reassessment of Adherence to Treatment Plan ASSESSMENTS - Wound and Skin Assessment /  Reassessment X - Simple Wound Assessment / Reassessment - one wound 1 5 []  - 0 Complex Wound Assessment / Reassessment - multiple wounds []  - 0 Dermatologic / Skin Assessment (not related to wound area) ASSESSMENTS - Focused Assessment []  - Circumferential Edema Measurements - multi extremities 0 []  - 0 Nutritional Assessment / Counseling / Intervention []  - 0 Lower Extremity Assessment (monofilament, tuning fork, pulses) []  - 0 Peripheral Arterial Disease Assessment (using hand held doppler) ASSESSMENTS - Ostomy and/or Continence Assessment and Care []  - Incontinence Assessment and Management 0 []  - 0 Ostomy Care Assessment and Management (repouching, etc.) PROCESS - Coordination of Care X - Simple Patient / Family Education for ongoing care 1 15 []  - 0 Complex (extensive) Patient / Family Education for ongoing care []  - 0 Staff obtains , Records, Test Results / Process Orders []  - 0 Staff telephones HHA, Nursing Homes / Clarify orders / etc []  - 0 Routine Transfer to another Facility (non-emergent condition) []  - 0 Routine Hospital Admission (non-emergent condition) []  - 0 New Admissions / / Ordering NPWT, Apligraf, etc. []  - 0 Emergency Hospital Admission (emergent condition) X- 1 10 Simple Discharge Coordination []  - 0 Complex (extensive) Discharge Coordination PROCESS - Special Needs []  - Pediatric / Minor Patient Management 0 []  - 0 Isolation Patient Management []  - 0 Hearing / Language / Visual special needs []  - 0 Assessment of Community assistance (transportation, D/C planning, etc.) []  - 0 Additional assistance / Altered mentation []  - 0 Support Surface(s) Assessment (bed, cushion, seat, etc.) INTERVENTIONS - Wound Cleansing / Measurement Gallery, Arianny A. ( ) X- 1 5 Simple Wound Cleansing - one wound []  - 0 Complex Wound Cleansing - multiple wounds X- 1 5 Wound Imaging (photographs - any number of wounds) []  -  0 Wound Tracing (instead of photographs) X- 1 5 Simple Wound Measurement - one wound []  -  0 Complex Wound Measurement - multiple wounds INTERVENTIONS - Wound Dressings []  - Small Wound Dressing one or multiple wounds 0 X- 1 15 Medium Wound Dressing one or multiple wounds []  - 0 Large Wound Dressing one or multiple wounds []  - 0 Application of Medications - topical []  - 0 Application of Medications - injection INTERVENTIONS - Miscellaneous []  - External ear exam 0 []  - 0 Specimen Collection (cultures, biopsies, blood, body fluids, etc.) []  - 0 Specimen(s) / Culture(s) sent or taken to Lab for analysis []  - 0 Patient Transfer (multiple staff / / Similar devices) []  - 0 Simple Staple / Suture removal (25 or less) []  - 0 Complex Staple / Suture removal (26 or more) []  - 0 Hypo / Hyperglycemic Management (close monitor of Blood Glucose) []  - 0 Ankle / Brachial Index (ABI) - do not check if billed separately X- 1 5 Vital Signs Has the patient been seen at the hospital within the last three years: Yes Total Score: 80 Level Of Care: New/Established - Level 3 Electronic Signature(s) Signed: 09/15/2019 12:47:05 PM By: Entered By: on 09/15/2019 09:18:39 A. ( ) -------------------------------------------------------------------------------- Encounter Discharge Information Details Patient Name: A. Date of Service: 09/15/2019 8:30 AM Medical Record Number: Patient Account Number: Date of Birth/Sex: 1928-05-31 (86 y.o. F) Treating RN: 09/17/2019 Primary Care Neya Creegan: Rodell Perna Other Clinician: Referring Gizella [place]: Rodell Perna Treating Kaire Stary/Extender: 09/17/2019, HOYT Weeks in Treatment: 28 Encounter Discharge Information Items Discharge Condition: Stable Ambulatory Status: Wheelchair Discharge Destination: Skilled Nursing Facility Telephoned: No Orders Sent: Yes Transportation:  Private Auto Accompanied By: family Schedule Follow-up Appointment: Yes Clinical Summary of Care: Electronic Signature(s) Signed: 09/15/2019 12:47:05 PM By: [id number] Entered By: Christene Lye on 09/15/2019 09:19:47 Hauser, Ragen A. ([id number]) -------------------------------------------------------------------------------- Lower Extremity Assessment Details Patient Name: [id number] A. Date of Service: 09/15/2019 8:30 AM Medical Record Number: [id number] Patient Account Number: Rodell Perna Date of Birth/Sex: Jul 07, 1928 (86 y.o. F) Treating RN: Linwood Dibbles Primary Care Jaamal Farooqui: 09/17/2019 Other Clinician: Referring Stacey Sago: Rodell Perna Treating Delylah Stanczyk/Extender: Rodell Perna, HOYT Weeks in Treatment: 28 Electronic Signature(s) Signed: 09/15/2019 12:47:05 PM By: [id number] Entered By: Christene Lye on 09/15/2019 09:03:12 Roan, Eshani A. ([id number]) -------------------------------------------------------------------------------- Multi Wound Chart Details Patient Name: [id number] A. Date of Service: 09/15/2019 8:30 AM Medical Record Number: [id number] Patient Account Number: Rodell Perna Date of Birth/Sex: [birth year] (86 y.o. F) Treating RN: Linwood Dibbles Primary Care Aneyah Lortz: 09/17/2019 Other Clinician: Referring Candie Gintz: TATE, Rodell Perna Treating Jaidan Prevette/Extender: STONE III, HOYT Weeks in Treatment: 28 Vital Signs Height(in): 67 Pulse(bpm): 112 Weight(lbs): 140 Blood Pressure(mmHg): 118/56 Body Mass Index(BMI): 22 Temperature(F): 98.9 Respiratory Rate(breaths/min): 16 Photos: [N/A:N/A] Wound Location: Midline Sacrum N/A N/A Wounding Event: Pressure Injury N/A N/A Primary Etiology: Pressure Ulcer N/A N/A Comorbid History: Glaucoma, Hypertension, N/A N/A Osteoarthritis, Dementia Date Acquired: 07/18/2018 N/A N/A Weeks of Treatment: 28 N/A N/A Wound Status: Open N/A N/A Clustered Wound: Yes N/A N/A Clustered Quantity: 1 N/A N/A Measurements L x W x D (cm) 3.8x3.3x2.5 N/A  N/A Area (cm) : 9.849 N/A N/A Volume (cm) : 24.622 N/A N/A % Reduction in Area: 43.00% N/A N/A % Reduction in Volume: -1324.90% N/A N/A Classification: Category/Stage IV N/A N/A Exudate Amount: Medium N/A N/A Exudate Type: Serous N/A N/A Exudate Color: amber N/A N/A Wound Margin: Indistinct, nonvisible N/A N/A Granulation Amount: Large (67-100%) N/A N/A Granulation Quality: Red N/A N/A Necrotic Amount: Small (1-33%) N/A N/A Exposed Structures: Fat Layer (Subcutaneous  Tissue) N/A N/A Exposed: Yes Fascia: No Tendon: No Muscle: No Joint: No Bone: No Epithelialization: Medium (34-66%) N/A N/A Treatment Notes Electronic Signature(s) Signed: 09/15/2019 12:47:05 PM By: Army Melia Entered By: Army Melia on 09/15/2019 09:18:18 Susa Griffins ([id number]) -------------------------------------------------------------------------------- [place] Details Patient Name: [name] Date of Service: 09/15/2019 8:30 AM Medical Record Number: [id number] Patient Account Number: [id number] Date of Birth/Sex: 1928-07-14 (86 y.o. F) Treating RN: Army Melia Primary Care Kariah Loredo: Benita Stabile Other Clinician: Referring Syd Newsome: Benita Stabile Treating Devontre Siedschlag/Extender: Melburn Hake, HOYT Weeks in Treatment: 28 Active Inactive Abuse / Safety / Falls / Self Care Management Nursing Diagnoses: Potential for falls Goals: Patient will remain injury free related to falls Date Initiated: 03/03/2019 Target Resolution Date: 03/16/2019 Goal Status: Active Interventions: Assess fall risk on admission and as needed Notes: Medication Nursing Diagnoses: Knowledge deficit related to medication safety: actual or potential Goals: Patient/caregiver will demonstrate understanding of all current medications Date Initiated: 03/03/2019 Target Resolution Date: 03/16/2019 Goal Status: Active Interventions: Assess for medication contraindications each visit where new medications are  prescribed Treatment Activities: New medication prescribed at [facility] : 03/03/2019 Notes: Necrotic Tissue Nursing Diagnoses: Impaired tissue integrity related to necrotic/devitalized tissue Goals: Necrotic/devitalized tissue will be minimized in the wound bed Date Initiated: 03/03/2019 Target Resolution Date: 03/16/2019 Goal Status: Active Interventions: Assess patient pain level pre-, during and post procedure and prior to discharge Treatment Activities: Apply topical anesthetic as ordered : 03/03/2019 Notes: Nutrition Nursing Diagnoses: Potential for alteratiion in Nutrition/Potential for imbalanced nutrition Newhart, Raja A. ([id number]) Goals: Patient/caregiver agrees to and verbalizes understanding of need to use nutritional supplements and/or vitamins as prescribed Date Initiated: 03/03/2019 Target Resolution Date: 03/16/2019 Goal Status: Active Interventions: Provide education on nutrition Notes: Pressure Nursing Diagnoses: Knowledge deficit related to management of pressures ulcers Goals: Patient/caregiver will verbalize understanding of pressure ulcer management Date Initiated: 03/03/2019 Target Resolution Date: 03/16/2019 Goal Status: Active Interventions: Assess: immobility, friction, shearing, incontinence upon admission and as needed Provide education on pressure ulcers Treatment Activities: Patient referred for home evaluation of offloading devices/mattresses : 03/03/2019 Patient referred for pressure reduction/relief devices : 03/03/2019 Pressure reduction/relief device ordered : 03/03/2019 Notes: Wound/Skin Impairment Nursing Diagnoses: Impaired tissue integrity Goals: Ulcer/skin breakdown will have a volume reduction of 30% by week 4 Date Initiated: 03/03/2019 Target Resolution Date: 03/03/2019 Goal Status: Active Interventions: Assess ulceration(s) every visit Treatment Activities: Skin care regimen initiated : 03/03/2019 Notes: Electronic  Signature(s) Signed: 09/15/2019 12:47:05 PM By: Army Melia Entered By: Army Melia on 09/15/2019 09:18:11 Kettlewell, Temiloluwa A. ([id number]) -------------------------------------------------------------------------------- Pain Assessment Details Patient Name: [name] Date of Service: 09/15/2019 8:30 AM Medical Record Number: [id number] Patient Account Number: [id number] Date of Birth/Sex: 1928/12/05 (86 y.o. F) Treating RN: Army Melia Primary Care Naseem Varden: Benita Stabile Other Clinician: Referring Eduard Penkala: Benita Stabile Treating Aqib Lough/Extender: STONE III, HOYT Weeks in Treatment: 28 Active Problems Location of Pain Severity and Description of Pain Patient Has Paino No Site Locations Pain Management and Medication Current Pain Management: Electronic Signature(s) Signed: 09/15/2019 12:47:05 PM By: Army Melia Entered By: Army Melia on 09/15/2019 09:00:53 Susa Griffins ([id number]) -------------------------------------------------------------------------------- Patient/Caregiver Education Details Patient Name: [name] Date of Service: 09/15/2019 8:30 AM Medical Record Number: [id number] Patient Account Number: [id number] Date of Birth/Gender: 03-17-1929 (86 y.o. F) Treating RN: Army Melia Primary Care Physician: Benita Stabile Other Clinician: Referring Physician: Benita Stabile Treating Physician/Extender: Sharalyn Ink in Treatment: 28 Education Assessment Education Provided To: Patient Education Topics Provided Wound/Skin Impairment:  Handouts: Caring for Your Ulcer Methods: Demonstration, Explain/Verbal Responses: State content correctly Electronic Signature(s) Signed: 09/15/2019 12:47:05 PM By: Rodell Perna Entered By: Rodell Perna on 09/15/2019 09:18:50 Dufault, Chaslyn A. ([id number]) -------------------------------------------------------------------------------- Wound Assessment Details Patient Name: [name] Date of Service: 09/15/2019 8:30 AM Medical  Record Number: [id number] Patient Account Number: [id number] Date of Birth/Sex: [birth year] (86 y.o. F) Treating RN: Rodell Perna Primary Care Sharlee Rufino: Dewaine Oats Other Clinician: Referring Bronwyn Belasco: Dewaine Oats Treating Zelina Jimerson/Extender: STONE III, HOYT Weeks in Treatment: 28 Wound Status Wound Number: 4 Primary Etiology: Pressure Ulcer Wound Location: Midline Sacrum Wound Status: Open Wounding Event: Pressure Injury Comorbid History: Glaucoma, Hypertension, Osteoarthritis, Dementia Date Acquired: 07/18/2018 Weeks Of Treatment: 28 Clustered Wound: Yes Photos Wound Measurements Length: (cm) 3.8 Width: (cm) 3.3 Depth: (cm) 2.5 Clustered Quantity: 1 Area: (cm) 9.849 Volume: (cm) 24.62 % Reduction in Area: 43% % Reduction in Volume: -1324.9% Epithelialization: Medium (34-66%) 2 Wound Description Classification: Category/Stage IV F Wound Margin: Indistinct, nonvisible S Exudate Amount: Medium Exudate Type: Serous Exudate Color: amber oul Odor After Cleansing: No lough/Fibrino Yes Wound Bed Granulation Amount: Large (67-100%) Exposed Structure Granulation Quality: Red Fascia Exposed: No Necrotic Amount: Small (1-33%) Fat Layer (Subcutaneous Tissue) Exposed: Yes Necrotic Quality: Adherent Slough Tendon Exposed: No Muscle Exposed: No Joint Exposed: No Bone Exposed: No Treatment Notes Wound #4 (Midline Sacrum) Notes Dakins gauze with ABD and tape to midline sacrum Gram, Itzayanna A. ([id number]) Electronic Signature(s) Signed: 09/15/2019 12:47:05 PM By: Rodell Perna Entered By: Rodell Perna on 09/15/2019 09:03:01 [name] ([id number]) -------------------------------------------------------------------------------- Vitals Details Patient Name: [name] Date of Service: 09/15/2019 8:30 AM Medical Record Number: [id number] Patient Account Number: [id number] Date of Birth/Sex: [birth year] (86 y.o. F) Treating RN: Rodell Perna Primary Care Rigo Letts: TATE, Katherina Right Other  Clinician: Referring Sargent Mankey: TATE, Katherina Right Treating Meliana Canner/Extender: STONE III, HOYT Weeks in Treatment: 28 Vital Signs Time Taken: 09:00 Temperature (F): 98.9 Height (in): 67 Pulse (bpm): 112 Weight (lbs): 140 Respiratory Rate (breaths/min): 16 Body Mass Index (BMI): 21.9 Blood Pressure (mmHg): 118/56 Reference Range: 80 - 120 mg / dl Electronic Signature(s) Signed: 09/15/2019 12:47:05 PM By: Rodell Perna Entered By: Rodell Perna on 09/15/2019 09:00:48

## 2019-09-29 ENCOUNTER — Encounter: Payer: Medicare Other | Attending: Internal Medicine | Admitting: Internal Medicine

## 2019-09-29 ENCOUNTER — Other Ambulatory Visit: Payer: Self-pay

## 2019-09-29 DIAGNOSIS — M1990 Unspecified osteoarthritis, unspecified site: Secondary | ICD-10-CM | POA: Insufficient documentation

## 2019-09-29 DIAGNOSIS — I10 Essential (primary) hypertension: Secondary | ICD-10-CM | POA: Diagnosis not present

## 2019-09-29 DIAGNOSIS — R54 Age-related physical debility: Secondary | ICD-10-CM | POA: Insufficient documentation

## 2019-09-29 DIAGNOSIS — L89153 Pressure ulcer of sacral region, stage 3: Secondary | ICD-10-CM | POA: Insufficient documentation

## 2019-09-29 DIAGNOSIS — F0150 Vascular dementia without behavioral disturbance: Secondary | ICD-10-CM | POA: Insufficient documentation

## 2019-09-30 NOTE — Progress Notes (Signed)
[name] [name], [name] [name] ([id number]) Visit Report for 09/29/2019 HPI Details Patient Name: [name] [name], [name] Date of Service: 09/29/2019 8:30 AM Medical Record Number: [id number] Patient Account Number: [id number] Date of Birth/Sex: 1928-08-09 (86 y.o. F) Treating RN: Rodell Perna Primary Care Provider: Dewaine Oats Other Clinician: Referring Provider: Dewaine Oats Treating Provider/Extender: Altamese [place] in Treatment: 30 History of Present Illness HPI Description: 03/03/2019 on evaluation today patient presents for initial evaluation here in the clinic today concerning issues that she has been having with wounds which are pressure in nature at multiple locations currently. This is on her left heel, right ankle, left hip/ischial location, and all her to varying degrees of severity and depth. The worst is on the left ischial location. At this time the patient has been tolerating dressing changes although I am not exactly sure the dressings that have been utilized prior to coming in today. She does have some necrotic tissue noted at several locations this can require at least some degree of cleaning prior to application of dressings going forward. The patient does have dementia and unfortunately in recent months has become increasingly immobile requiring more significant treatment she was just recently moved roughly 2 weeks ago from assisted living to a skilled nursing facility at [facility]. Based on what I am seeing currently these pressure injuries occurred prior to that 2-week time I do not see any new or obvious pressure injury at this point. I discussed with the patient as well as her family member that this does appear to be doing better in my opinion than likely where things started. They are unsure as to whether or not she has an air mattress at the facility I do think that would be something that would be appropriate and good for her to have but at the moment I do not know for sure whether  that is already in place if not that definitely is can be 1 of the recommendations. Also think a Roho cushion for her wheelchair would be good as well as Prevalon offloading boots. The patient currently does not have any severe pain although when I was cleaning some of the areas she did note there was some discomfort at several of the locations. She does have a history of hypertension as well. 10/29; this is a patient from [facility] nursing home. She is nonambulatory. She is here for follow-up on 4 different wounds 1 over the left lateral malleolus, the right medial calcaneus, the left posterior hip and I believe a new area over the sacrum. The patient is nonambulatory but apparently eats well. 03/24/2019 on evaluation today patient actually appears to be doing better with regard to her wounds in general based on what I am seeing. Fortunately there is no signs of active infection at this time. No fever chills noted. Overall been very pleased with the progress that seems to be occurring since I last saw her. There is a little bit of debridement That will need to be performed today. 04/07/2019 on evaluation today patient actually appears to be doing well in some regards based on what I am seeing today. Fortunately there is no signs of active infection at this time which is good news. No fever chills noted. With that being said the wounds on her foot and ankle region in general have healed at this point. I do not see anything open in that region. With regard to the other 2 wounds in the left ischium and midline sacral region I believe that  we may need to make a dressing change at this point to do something a little bit more conducive to cleaning up the wound bed. The patient is in agreement with that plan today. 04/21/2019 on evaluation today patient actually appears to be doing well with regard to her wounds all things considered. Both appear to be much cleaner than what they were previous.  Fortunately there is no signs of active infection at this time. Both I think are doing well with the current wound care measures which is the Dakin moistened gauze dressings. 05/05/2019 on evaluation today patient seems to be doing much better in regard to her ischial ulcer unfortunately she still seems to be getting pressure to the sacral region. I think that this is evidenced by the fact that she does have some surrounding deep tissue injury and the fact that the wound is actually measuring larger not better. Fortunately there is no evidence of active infection at this time I do believe the Dakin's is still the best option treatment wise but again I do think the patient needs more appropriate offloading on the sacral region. 06/24/2019 on evaluation today patient appears to be doing well with regard to the wound over the ischium which appears to be completely healed. This is great news. Fortunately there does appear to be some improvement with regard to the sacral wound as well. There still does seem to be evidence of necrotic tissue noted minimally that I was able to gently clean away. With that being said the wound itself seems to be doing quite well which is good news. She seems to be offloaded appropriately in my opinion at this point. I think she may be ready for a wound VAC. 07/14/2019 on evaluation today patient appears to be doing well with regard to her sacral wound in general although unfortunately she does appear to have some evidence of potential infection there is some odor that seems to be a little stronger than just what would normally be noted with the wound VAC. With that being said I do think we need to address this and though I think the wound VAC is appropriate to continue I think that we do need to see about a culture as well as placing her on some antibiotic therapy at this point. The patient's granddaughter was present during evaluation today as well. 07/28/2019 upon evaluation  today patient unfortunately appears to not be doing quite as well as I would like with regard to the wound VAC and really was not applied appropriately. The phone was applied directly to the skin which did cause a little bit of breakdown around the opening of the wound. Subsequently is also unfortunately the case that the foam that was down at the sacral region and that was tracked up and along her back was not actually connected so they touched this obviously inhibits the ability to be able to properly drain fluid from 1 to the other the have to be continuous and touching. With that being said there was some improvement today in the overall appearance of the wound bed though obviously size wise not much difference today. We are going to get in touch with the facility to try to make sure that they know what they are doing with applying the wound VAC I am also can likely see her for more frequent follow-up for the time being. All this was discussed with the patient's grandson during the office visit today as well. 08/11/19 upon evaluation today patient appears  to be doing poorly in regard to her wound at this time. Unfortunately there appears to be infection at this time including blue-green drainage and a poor quality to the granulation tissue all of which point toward infection she also seems to be much more painful than we noticed in the past. She is moving around a lot just with very light touch around the area I think that is an indication of discomfort [name], [name] ([id number]) 08/18/2019 upon evaluation today patient appears to be doing excellent in regard to her wound as compared to last evaluation. There is still some odor but she does not have as much of the necrotic tissue nor the significant drainage that I noted at the last visit. There still does appear to be evidence of infection and she still is much more tender than what she was experiencing prior to the infection noted last week. I did  review her culture unfortunately she has multiple organisms noted 1 which is a resistant E. coli. I do believe that she likely is getting need to see infectious disease due to the ongoing nature of this infection. For now would you still have to hold off on reinitiating the wound VAC. 08/25/2019 upon evaluation today patient appears to be doing excellent with regard to the overall appearance of the wound though there is still is an odor and I do think that though the wound VAC would be best for her were not quite at the point of getting that on yet she actually has her appointment with infectious disease this afternoon it sounds like this will be with Dr. Sampson Goon. Fortunately there is no signs of systemic infection at this time. 09/01/2019 upon evaluation today patient appears to be doing excellent at this point in regard to her sacral wound. In fact I do not even notice any odor once everything was cleaned away and I came into the room to evaluate the patient the wound seems to be doing very well. There is excellent granulation, no necrotic tissue, and overall I think the Dakin's solution has done extremely well. I did talk with Dr. Sampson Goon who saw the patient in the interim earlier this week and of note her lab work and everything else checked out just fine and Dr. Sampson Goon really was not concerned about the possibility of the need for IV antibiotic therapy. Obviously if anything worsens or changes in that may still be a consideration but right now she seems to actually be doing extremely well on all fronts. I think the Dakin's moistened gauze is actually a very good option for her to continue at this point. 09/15/2019 upon evaluation today patient appears to be doing well with regard to her wound in general. Fortunately there is no signs of significant infection overall I feel like she is doing excellent in that regard. With that being said I think the Dakin's moistened gauze packing has  done extremely well for her 5/13; 2-week follow-up. The patient has a deep wound on the lower sacral area however generally healthy looking surface over the bone here. There was no palpable bone no surrounding infection. I see that they have been using Dakin's moistened wet-to-dry. A wound VAC is apparently on hold. Electronic Signature(s) Signed: 09/29/2019 5:28:47 PM By: Baltazar Najjar MD Entered By: Baltazar Najjar on 09/29/2019 09:27:29 [name] [name] ([id number]) -------------------------------------------------------------------------------- Physical Exam Details Patient Name: [name] Lye A. Date of Service: 09/29/2019 8:30 AM Medical Record Number: [id number] Patient Account Number: [id number] Date of Birth/Sex: March 26, 1929 (86  y.o. F) Treating RN: Army Melia Primary Care Provider: Benita Stabile Other Clinician: Referring Provider: TATE, Sharlet [place] Treating Provider/Extender: Ricard Dillon Weeks in Treatment: 30 Constitutional Sitting or standing Blood Pressure is within target range for patient.. Pulse regular and within target range for patient.Marland Kitchen Respirations regular, non- labored and within target range.Marland Kitchen appears in no distress. Frail elderly patient.Marland Kitchen Respiratory Respiratory effort is easy and symmetric bilaterally. Rate is normal at rest and on room air.. Cardiovascular She is not grossly dehydrated. Gastrointestinal (GI) Abdomen is soft and non-distended without masses or tenderness. Bowel sounds active in all quadrants.. No liver or spleen enlargement or tenderness.. Genitourinary (GU) Bladder is not distended. Notes Wound exam; the patient has decent granulation at the base of the wound although this is right against bone. There is no palpable bone. I checked in a cursory fashion I did not see any other pressure areas heels right hip etc. No surrounding erythema Electronic Signature(s) Signed: 09/29/2019 5:28:47 PM By: Linton Ham MD Entered By: Linton Ham on  09/29/2019 09:29:14 [name] [name] ([id number]) -------------------------------------------------------------------------------- Physician Orders Details Patient Name: [name] Roan A. Date of Service: 09/29/2019 8:30 AM Medical Record Number: [id number] Patient Account Number: [id number] Date of Birth/Sex: [birth year] (86 y.o. F) Treating RN: Army Melia Primary Care Provider: Benita Stabile Other Clinician: Referring Provider: TATE, Sharlet [place] Treating Provider/Extender: Tito Dine in Treatment: 30 Verbal / Phone Orders: No Diagnosis Coding Wound Cleansing Wound #4 Midline Sacrum o Clean wound with Normal Saline. o Cleanse wound with mild soap and water Primary Wound Dressing Wound #4 Midline Sacrum o Silver Collagen - moistened with hydrogel Secondary Dressing Wound #4 Midline Sacrum o ABD pad - secure with tape o Saline moistened gauze - wet to dry packing Dressing Change Frequency Wound #4 Midline Sacrum o Change dressing every other day. Follow-up Appointments Wound #4 Midline Sacrum o Return Appointment in 2 weeks. Off-Loading Wound #4 Midline Sacrum o Roho cushion for wheelchair - pt needs Roho cushion for her chair o Mattress - Pt needs Air mattress o Turn and reposition every 2 hours - Pt is still getting pressure to her midline sacrum. Please reposition pt EVERY 2 HOURS Negative Pressure Wound Therapy Wound #4 Midline Sacrum o Place NPWT on HOLD. - Until infection is under control Electronic Signature(s) Signed: 09/29/2019 11:19:24 AM By: Army Melia Signed: 09/29/2019 5:28:47 PM By: Linton Ham MD Entered By: Army Melia on 09/29/2019 09:19:01 [name] Roan A. ([id number]) -------------------------------------------------------------------------------- Problem List Details Patient Name: [name] Roan A. Date of Service: 09/29/2019 8:30 AM Medical Record Number: [id number] Patient Account Number: [id number] Date of Birth/Sex: 24-Sep-1928 (86  y.o. F) Treating RN: Army Melia Primary Care Provider: Benita Stabile Other Clinician: Referring Provider: Benita Stabile Treating Provider/Extender: Tito Dine in Treatment: 30 Active Problems ICD-10 Encounter Code Description Active Date MDM Diagnosis L89.153 Pressure ulcer of sacral region, stage 3 03/24/2019 No Yes I10 Essential (primary) hypertension 03/03/2019 No Yes F01.50 Vascular dementia without behavioral disturbance 03/03/2019 No Yes Inactive Problems ICD-10 Code Description Active Date Inactive Date L89.623 Pressure ulcer of left heel, stage 3 03/03/2019 03/03/2019 L89.513 Pressure ulcer of right ankle, stage 3 03/03/2019 03/03/2019 L89.224 Pressure ulcer of left hip, stage 4 03/03/2019 03/03/2019 Resolved Problems Electronic Signature(s) Signed: 09/29/2019 5:28:47 PM By: Linton Ham MD Entered By: Linton Ham on 09/29/2019 09:25:54 [name] Roan A. ([id number]) -------------------------------------------------------------------------------- Progress Note Details Patient Name: [name] Roan A. Date of Service: 09/29/2019 8:30 AM Medical Record Number: [id number] Patient Account Number: [id number] Date of Birth/Sex: 1928-10-09 (  86 y.o. F) Treating RN: Rodell Perna Primary Care Provider: Dewaine Oats Other Clinician: Referring Provider: Dewaine Oats Treating Provider/Extender: Altamese [place] in Treatment: 30 Subjective History of Present Illness (HPI) 03/03/2019 on evaluation today patient presents for initial evaluation here in the clinic today concerning issues that she has been having with wounds which are pressure in nature at multiple locations currently. This is on her left heel, right ankle, left hip/ischial location, and all her to varying degrees of severity and depth. The worst is on the left ischial location. At this time the patient has been tolerating dressing changes although I am not exactly sure the dressings that have been utilized  prior to coming in today. She does have some necrotic tissue noted at several locations this can require at least some degree of cleaning prior to application of dressings going forward. The patient does have dementia and unfortunately in recent months has become increasingly immobile requiring more significant treatment she was just recently moved roughly 2 weeks ago from assisted living to a skilled nursing facility at [facility]. Based on what I am seeing currently these pressure injuries occurred prior to that 2-week time I do not see any new or obvious pressure injury at this point. I discussed with the patient as well as her family member that this does appear to be doing better in my opinion than likely where things started. They are unsure as to whether or not she has an air mattress at the facility I do think that would be something that would be appropriate and good for her to have but at the moment I do not know for sure whether that is already in place if not that definitely is can be 1 of the recommendations. Also think a Roho cushion for her wheelchair would be good as well as Prevalon offloading boots. The patient currently does not have any severe pain although when I was cleaning some of the areas she did note there was some discomfort at several of the locations. She does have a history of hypertension as well. 10/29; this is a patient from [facility] nursing home. She is nonambulatory. She is here for follow-up on 4 different wounds 1 over the left lateral malleolus, the right medial calcaneus, the left posterior hip and I believe a new area over the sacrum. The patient is nonambulatory but apparently eats well. 03/24/2019 on evaluation today patient actually appears to be doing better with regard to her wounds in general based on what I am seeing. Fortunately there is no signs of active infection at this time. No fever chills noted. Overall been very pleased with the  progress that seems to be occurring since I last saw her. There is a little bit of debridement That will need to be performed today. 04/07/2019 on evaluation today patient actually appears to be doing well in some regards based on what I am seeing today. Fortunately there is no signs of active infection at this time which is good news. No fever chills noted. With that being said the wounds on her foot and ankle region in general have healed at this point. I do not see anything open in that region. With regard to the other 2 wounds in the left ischium and midline sacral region I believe that we may need to make a dressing change at this point to do something a little bit more conducive to cleaning up the wound bed. The patient is in agreement with that  plan today. 04/21/2019 on evaluation today patient actually appears to be doing well with regard to her wounds all things considered. Both appear to be much cleaner than what they were previous. Fortunately there is no signs of active infection at this time. Both I think are doing well with the current wound care measures which is the Dakin moistened gauze dressings. 05/05/2019 on evaluation today patient seems to be doing much better in regard to her ischial ulcer unfortunately she still seems to be getting pressure to the sacral region. I think that this is evidenced by the fact that she does have some surrounding deep tissue injury and the fact that the wound is actually measuring larger not better. Fortunately there is no evidence of active infection at this time I do believe the Dakin's is still the best option treatment wise but again I do think the patient needs more appropriate offloading on the sacral region. 06/24/2019 on evaluation today patient appears to be doing well with regard to the wound over the ischium which appears to be completely healed. This is great news. Fortunately there does appear to be some improvement with regard to the sacral  wound as well. There still does seem to be evidence of necrotic tissue noted minimally that I was able to gently clean away. With that being said the wound itself seems to be doing quite well which is good news. She seems to be offloaded appropriately in my opinion at this point. I think she may be ready for a wound VAC. 07/14/2019 on evaluation today patient appears to be doing well with regard to her sacral wound in general although unfortunately she does appear to have some evidence of potential infection there is some odor that seems to be a little stronger than just what would normally be noted with the wound VAC. With that being said I do think we need to address this and though I think the wound VAC is appropriate to continue I think that we do need to see about a culture as well as placing her on some antibiotic therapy at this point. The patient's granddaughter was present during evaluation today as well. 07/28/2019 upon evaluation today patient unfortunately appears to not be doing quite as well as I would like with regard to the wound VAC and really was not applied appropriately. The phone was applied directly to the skin which did cause a little bit of breakdown around the opening of the wound. Subsequently is also unfortunately the case that the foam that was down at the sacral region and that was tracked up and along her back was not actually connected so they touched this obviously inhibits the ability to be able to properly drain fluid from 1 to the other the have to be continuous and touching. With that being said there was some improvement today in the overall appearance of the wound bed though obviously size wise not much difference today. We are going to get in touch with the facility to try to make sure that they know what they are doing with applying the wound VAC I am also can likely see her for more frequent follow-up for the time being. All this was discussed with the  patient's grandson during the office visit today as well. 08/11/19 upon evaluation today patient appears to be doing poorly in regard to her wound at this time. Unfortunately there appears to be infection at this time including blue-green drainage and a poor quality to the granulation tissue  all of which point toward infection she also seems to be much more painful than we noticed in the past. She is moving around a lot just with very light touch around the area I think that is an indication of discomfort 08/18/2019 upon evaluation today patient appears to be doing excellent in regard to her wound as compared to last evaluation. There is still some odor but she does not have as much of the necrotic tissue nor the significant drainage that I noted at the last visit. There still does appear to be evidence of infection and she still is much more tender than what she was experiencing prior to the infection noted last week. 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 I do believe that she likely is getting need to see infectious [name] [name], [name] ([id number]) disease due to the ongoing nature of this infection. For now would you still have to hold off on reinitiating the wound VAC. 08/25/2019 upon evaluation today patient appears to be doing excellent with regard to the overall appearance of the wound though there is still is an odor and I do think that though the wound VAC would be best for her were not quite at the point of getting that on yet she actually has her appointment with infectious disease this afternoon it sounds like this will be with Dr. Sampson Goon. Fortunately there is no signs of systemic infection at this time. 09/01/2019 upon evaluation today patient appears to be doing excellent at this point in regard to her sacral wound. In fact I do not even notice any odor once everything was cleaned away and I came into the room to evaluate the  patient the wound seems to be doing very well. There is excellent granulation, no necrotic tissue, and overall I think the Dakin's solution has done extremely well. I did talk with Dr. Sampson Goon who saw the patient in the interim earlier this week and of note her lab work and everything else checked out just fine and Dr. Sampson Goon really was not concerned about the possibility of the need for IV antibiotic therapy. Obviously if anything worsens or changes in that may still be a consideration but right now she seems to actually be doing extremely well on all fronts. I think the Dakin's moistened gauze is actually a very good option for her to continue at this point. 09/15/2019 upon evaluation today patient appears to be doing well with regard to her wound in general. Fortunately there is no signs of significant infection overall I feel like she is doing excellent in that regard. With that being said I think the Dakin's moistened gauze packing has done extremely well for her 5/13; 2-week follow-up. The patient has a deep wound on the lower sacral area however generally healthy looking surface over the bone here. There was no palpable bone no surrounding infection. I see that they have been using Dakin's moistened wet-to-dry. A wound VAC is apparently on hold. Objective Constitutional Sitting or standing Blood Pressure is within target range for patient.. Pulse regular and within target range for patient.Marland Kitchen Respirations regular, non- labored and within target range.Marland Kitchen appears in no distress. Frail elderly patient.. Vitals Time Taken: 8:53 AM, Height: 67 in, Weight: 140 lbs, BMI: 21.9, Temperature: 98.3 F, Pulse: 69 bpm, Respiratory Rate: 16 breaths/min, Blood Pressure: 105/71 mmHg. Respiratory Respiratory effort is easy and symmetric bilaterally. Rate is normal at rest and on room air.. Cardiovascular She  is not grossly dehydrated. Gastrointestinal (GI) Abdomen is soft and non-distended without  masses or tenderness. Bowel sounds active in all quadrants.. No liver or spleen enlargement or tenderness.. Genitourinary (GU) Bladder is not distended. General Notes: Wound exam; the patient has decent granulation at the base of the wound although this is right against bone. There is no palpable bone. I checked in a cursory fashion I did not see any other pressure areas heels right hip etc. No surrounding erythema Integumentary (Hair, Skin) Wound #4 status is Open. Original cause of wound was Pressure Injury. The wound is located on the Midline Sacrum. The wound measures 4.2cm length x 4cm width x 2.5cm depth; 13.195cm^2 area and 32.987cm^3 volume. There is Fat Layer (Subcutaneous Tissue) Exposed exposed. There is no tunneling or undermining noted. There is a medium amount of serous drainage noted. The wound margin is indistinct and nonvisible. There is large (67-100%) red granulation within the wound bed. There is a small (1-33%) amount of necrotic tissue within the wound bed including Adherent Slough. Assessment Active Problems ICD-10 Pressure ulcer of sacral region, stage 3 Essential (primary) hypertension Vascular dementia without behavioral disturbance [name] [name], [name] ([id number]) Plan Wound Cleansing: Wound #4 Midline Sacrum: Clean wound with Normal Saline. Cleanse wound with mild soap and water Primary Wound Dressing: Wound #4 Midline Sacrum: Silver Collagen - moistened with hydrogel Secondary Dressing: Wound #4 Midline Sacrum: ABD pad - secure with tape Saline moistened gauze - wet to dry packing Dressing Change Frequency: Wound #4 Midline Sacrum: Change dressing every other day. Follow-up Appointments: Wound #4 Midline Sacrum: Return Appointment in 2 weeks. Off-Loading: Wound #4 Midline Sacrum: Roho cushion for wheelchair - pt needs Roho cushion for her chair Mattress - Pt needs Air mattress Turn and reposition every 2 hours - Pt is still getting pressure to her  midline sacrum. Please reposition pt EVERY 2 HOURS Negative Pressure Wound Therapy: Wound #4 Midline Sacrum: Place NPWT on HOLD. - Until infection is under control 1. I think by description the Dakin's has done a good job with the base of this wound however the granulation is right against bone. I am going to change the dressing to silver collagen with the backing wet-to-dry to see if we can stimulate any additional granulation 2. I was not really prepared to consider restarting a wound VAC. I wonder whether the patient has enough nutritional parameters to heal these wounds and whether she is offloading this in the facility. Her granddaughter is with her but really does not know any of this information. 3. I think by description the overall wound looks better. We will have to see if we can get further granulation here. Thankfully no evidence of infection Electronic Signature(s) Signed: 09/29/2019 5:28:47 PM By: Baltazar Najjar MD Entered By: Baltazar Najjar on 09/29/2019 09:36:59 [name] [name], [name] [name] ([id number]) -------------------------------------------------------------------------------- SuperBill Details Patient Name: [name] Lye A. Date of Service: 09/29/2019 Medical Record Number: [id number] Patient Account Number: [id number] Date of Birth/Sex: 1929-03-23 (86 y.o. F) Treating RN: Rodell Perna Primary Care Provider: Dewaine Oats Other Clinician: Referring Provider: Dewaine Oats Treating Provider/Extender: Altamese [place] in Treatment: 30 Diagnosis Coding ICD-10 Codes Code Description L89.153 Pressure ulcer of sacral region, stage 3 I10 Essential (primary) hypertension F01.50 Vascular dementia without behavioral disturbance Facility Procedures CPT4 Code: 38937342 Description: 99213 - WOUND CARE VISIT-LEV 3 EST PT Modifier: Quantity: 1 Physician Procedures CPT4 Code: 8768115 Description: 99213 - WC PHYS LEVEL 3 - EST PT Modifier: Quantity: 1 CPT4 Code: Description: ICD-10  Diagnosis  Description L89.153 Pressure ulcer of sacral region, stage 3 Modifier: Quantity: Electronic Signature(s) Signed: 09/29/2019 5:28:47 PM By: Baltazar Najjar MD Entered By: Baltazar Najjar on 09/29/2019 09:37:26

## 2019-09-30 NOTE — Progress Notes (Signed)
[name], [name] ([id number]) Visit Report for 09/29/2019 Arrival Information Details Patient Name: [name] [name], [name] [name]. Date of Service: 09/29/2019 8:30 AM Medical Record Number: [id number] Patient Account Number: [id number] Date of Birth/Sex: [birth year] (86 y.o. F) Treating RN: Curtis Sites Primary Care Dacota Devall: Dewaine Oats Other Clinician: Referring Lizzeth Meder: Dewaine Oats Treating Tahir Blank/Extender: Altamese [place] in Treatment: 30 Visit Information History Since Last Visit Added or deleted any medications: No Patient Arrived: Wheel Chair Any new allergies or adverse reactions: No Arrival Time: 08:51 Had [name] fall or experienced change in No Accompanied By: grandaughter activities of daily living that may affect Transfer Assistance: Hoyer Lift risk of falls: Patient Identification Verified: Yes Signs or symptoms of abuse/neglect since last visito No Secondary Verification Process Completed: Yes Hospitalized since last visit: No Implantable device outside of the clinic excluding No cellular tissue based products placed in the center since last visit: Has Dressing in Place as Prescribed: Yes Pain Present Now: No Electronic Signature(s) Signed: 09/29/2019 5:03:46 PM By: Curtis Sites Entered By: Curtis Sites on 09/29/2019 08:53:06 [name] [name] ([id number]) -------------------------------------------------------------------------------- Clinic Level of Care Assessment Details Patient Name: [name] [name]. Date of Service: 09/29/2019 8:30 AM Medical Record Number: [id number] Patient Account Number: [id number] Date of Birth/Sex: 10-06-1928 (86 y.o. F) Treating RN: Rodell Perna Primary Care Ahmani Prehn: Dewaine Oats Other Clinician: Referring Janelli Welling: TATE, Katherina Right Treating Alistair Senft/Extender: Altamese [place] in Treatment: 30 Clinic Level of Care Assessment Items TOOL 4 Quantity Score []  - Use when only an EandM is performed on FOLLOW-UP visit 0 ASSESSMENTS - Nursing Assessment  / Reassessment X - Reassessment of Co-morbidities (includes updates in patient status) 1 10 X- 1 5 Reassessment of Adherence to Treatment Plan ASSESSMENTS - Wound and Skin Assessment / Reassessment X - Simple Wound Assessment / Reassessment - one wound 1 5 []  - 0 Complex Wound Assessment / Reassessment - multiple wounds []  - 0 Dermatologic / Skin Assessment (not related to wound area) ASSESSMENTS - Focused Assessment []  - Circumferential Edema Measurements - multi extremities 0 []  - 0 Nutritional Assessment / Counseling / Intervention []  - 0 Lower Extremity Assessment (monofilament, tuning fork, pulses) []  - 0 Peripheral Arterial Disease Assessment (using hand held doppler) ASSESSMENTS - Ostomy and/or Continence Assessment and Care []  - Incontinence Assessment and Management 0 []  - 0 Ostomy Care Assessment and Management (repouching, etc.) PROCESS - Coordination of Care X - Simple Patient / Family Education for ongoing care 1 15 []  - 0 Complex (extensive) Patient / Family Education for ongoing care []  - 0 Staff obtains , Records, Test Results / Process Orders []  - 0 Staff telephones HHA, Nursing Homes / Clarify orders / etc []  - 0 Routine Transfer to another Facility (non-emergent condition) []  - 0 Routine Hospital Admission (non-emergent condition) []  - 0 New Admissions / / Ordering NPWT, Apligraf, etc. []  - 0 Emergency Hospital Admission (emergent condition) X- 1 10 Simple Discharge Coordination []  - 0 Complex (extensive) Discharge Coordination PROCESS - Special Needs []  - Pediatric / Minor Patient Management 0 []  - 0 Isolation Patient Management []  - 0 Hearing / Language / Visual special needs []  - 0 Assessment of Community assistance (transportation, D/C planning, etc.) []  - 0 Additional assistance / Altered mentation []  - 0 Support Surface(s) Assessment (bed, cushion, seat, etc.) INTERVENTIONS - Wound Cleansing /  Measurement [name] [name]. ( ) X- 1 5 Simple Wound Cleansing - one wound []  - 0 Complex Wound Cleansing - multiple wounds X- 1 5 Wound  Imaging (photographs - any number of wounds) []  - 0 Wound Tracing (instead of photographs) X- 1 5 Simple Wound Measurement - one wound []  - 0 Complex Wound Measurement - multiple wounds INTERVENTIONS - Wound Dressings []  - Small Wound Dressing one or multiple wounds 0 X- 1 15 Medium Wound Dressing one or multiple wounds []  - 0 Large Wound Dressing one or multiple wounds []  - 0 Application of Medications - topical []  - 0 Application of Medications - injection INTERVENTIONS - Miscellaneous []  - External ear exam 0 []  - 0 Specimen Collection (cultures, biopsies, blood, body fluids, etc.) []  - 0 Specimen(s) / Culture(s) sent or taken to Lab for analysis []  - 0 Patient Transfer (multiple staff / Civil Service fast streamer / Similar devices) []  - 0 Simple Staple / Suture removal (25 or less) []  - 0 Complex Staple / Suture removal (26 or more) []  - 0 Hypo / Hyperglycemic Management (close monitor of Blood Glucose) []  - 0 Ankle / Brachial Index (ABI) - do not check if billed separately X- 1 5 Vital Signs Has the patient been seen at the hospital within the last three years: Yes Total Score: 80 Level Of Care: New/Established - Level 3 Electronic Signature(s) Signed: 09/29/2019 11:19:24 AM By: Army Melia Entered By: Army Melia on 09/29/2019 09:16:15 [name] [name]. ([id number]) -------------------------------------------------------------------------------- Encounter Discharge Information Details Patient Name: [name] [name]. Date of Service: 09/29/2019 8:30 AM Medical Record Number: [id number] Patient Account Number: [id number] Date of Birth/Sex: 08-25-1928 (86 y.o. F) Treating RN: Army Melia Primary Care Genia Perin: Benita Stabile Other Clinician: Referring Eastyn Dattilo: Benita Stabile Treating Eulan Heyward/Extender: Tito Dine in Treatment:  30 Encounter Discharge Information Items Discharge Condition: Stable Ambulatory Status: Wheelchair Discharge Destination: [place] Telephoned: No Orders Sent: Yes Transportation: Private Auto Accompanied By: family Schedule Follow-up Appointment: Yes Clinical Summary of Care: Electronic Signature(s) Signed: 09/29/2019 11:19:24 AM By: Army Melia Entered By: Army Melia on 09/29/2019 09:17:02 [name] [name]. ([id number]) -------------------------------------------------------------------------------- Lower Extremity Assessment Details Patient Name: [name] [name]. Date of Service: 09/29/2019 8:30 AM Medical Record Number: [id number] Patient Account Number: [id number] Date of Birth/Sex: 1929-05-05 (86 y.o. F) Treating RN: Montey Hora Primary Care Danthony Kendrix: Benita Stabile Other Clinician: Referring Klaira Pesci: Benita Stabile Treating Mazzy Santarelli/Extender: Ricard Dillon Weeks in Treatment: 30 Electronic Signature(s) Signed: 09/29/2019 5:03:46 PM By: Montey Hora Entered By: Montey Hora on 09/29/2019 08:53:15 [place], [name] [name] [name]. ([id number]) -------------------------------------------------------------------------------- Multi Wound Chart Details Patient Name: [name] [name]. Date of Service: 09/29/2019 8:30 AM Medical Record Number: [id number] Patient Account Number: [id number] Date of Birth/Sex: Mar 26, 1929 (86 y.o. F) Treating RN: Army Melia Primary Care Mallerie Blok: Benita Stabile Other Clinician: Referring Kaileena Obi: TATE, Sharlet Salina Treating Darrin Apodaca/Extender: Tito Dine in Treatment: 30 Vital Signs Height(in): 67 Pulse(bpm): 69 Weight(lbs): 140 Blood Pressure(mmHg): 105/71 Body Mass Index(BMI): 22 Temperature(F): 98.3 Respiratory Rate(breaths/min): 16 Photos: [N/[name]:N/[name]] Wound Location: Midline Sacrum N/[name] N/[name] Wounding Event: Pressure Injury N/[name] N/[name] Primary Etiology: Pressure Ulcer N/[name] N/[name] Comorbid History: Glaucoma, Hypertension, N/[name] N/[name] Osteoarthritis,  Dementia Date Acquired: 07/18/2018 N/[name] N/[name] Weeks of Treatment: 30 N/[name] N/[name] Wound Status: Open N/[name] N/[name] Clustered Wound: Yes N/[name] N/[name] Clustered Quantity: 1 N/[name] N/[name] Measurements L x W x D (cm) 4.2x4x2.5 N/[name] N/[name] Area (cm) : 13.195 N/[name] N/[name] Volume (cm) : 32.987 N/[name] N/[name] % Reduction in Area: 23.60% N/[name] N/[name] % Reduction in Volume: -1809.00% N/[name] N/[name] Classification: Category/Stage IV N/[name] N/[name] Exudate Amount: Medium N/[name] N/[name] Exudate Type: Serous N/[name] N/[name] Exudate Color: amber N/[name] N/[name] Wound Margin:  Indistinct, nonvisible N/[name] N/[name] Granulation Amount: Large (67-100%) N/[name] N/[name] Granulation Quality: Red N/[name] N/[name] Necrotic Amount: Small (1-33%) N/[name] N/[name] Exposed Structures: Fat Layer (Subcutaneous Tissue) N/[name] N/[name] Exposed: Yes Fascia: No Tendon: No Muscle: No Joint: No Bone: No Epithelialization: Medium (34-66%) N/[name] N/[name] Treatment Notes Wound #4 (Midline Sacrum) Notes prisma, wet to dry with ABD and tape to midline sacrum [name] [name], [name] [name] Kitchen ([id number]) Electronic Signature(s) Signed: 09/29/2019 5:28:47 PM By: Baltazar Najjar MD Entered By: Baltazar Najjar on 09/29/2019 09:26:08 [name] ([id number]) -------------------------------------------------------------------------------- Multi-Disciplinary Care Plan Details Patient Name: [name] [name]. Date of Service: 09/29/2019 8:30 AM Medical Record Number: [id number] Patient Account Number: [id number] Date of Birth/Sex: 11/29/1928 (86 y.o. F) Treating RN: Rodell Perna Primary Care Deshea Pooley: Dewaine Oats Other Clinician: Referring Noheli Melder: Dewaine Oats Treating Hemi Chacko/Extender: Altamese [place] in Treatment: 30 Active Inactive Abuse / Safety / Falls / Self Care Management Nursing Diagnoses: Potential for falls Goals: Patient will remain injury free related to falls Date Initiated: 03/03/2019 Target Resolution Date: 03/16/2019 Goal Status: Active Interventions: Assess fall risk on admission and as needed Notes: Medication Nursing  Diagnoses: Knowledge deficit related to medication safety: actual or potential Goals: Patient/caregiver will demonstrate understanding of all current medications Date Initiated: 03/03/2019 Target Resolution Date: 03/16/2019 Goal Status: Active Interventions: Assess for medication contraindications each visit where new medications are prescribed Treatment Activities: New medication prescribed at Wound Center : 03/03/2019 Notes: Necrotic Tissue Nursing Diagnoses: Impaired tissue integrity related to necrotic/devitalized tissue Goals: Necrotic/devitalized tissue will be minimized in the wound bed Date Initiated: 03/03/2019 Target Resolution Date: 03/16/2019 Goal Status: Active Interventions: Assess patient pain level pre-, during and post procedure and prior to discharge Treatment Activities: Apply topical anesthetic as ordered : 03/03/2019 Notes: Nutrition Nursing Diagnoses: Potential for alteratiion in Nutrition/Potential for imbalanced nutrition Polcyn, [name] [name]. ([id number]) Goals: Patient/caregiver agrees to and verbalizes understanding of need to use nutritional supplements and/or vitamins as prescribed Date Initiated: 03/03/2019 Target Resolution Date: 03/16/2019 Goal Status: Active Interventions: Provide education on nutrition Notes: Pressure Nursing Diagnoses: Knowledge deficit related to management of pressures ulcers Goals: Patient/caregiver will verbalize understanding of pressure ulcer management Date Initiated: 03/03/2019 Target Resolution Date: 03/16/2019 Goal Status: Active Interventions: Assess: immobility, friction, shearing, incontinence upon admission and as needed Provide education on pressure ulcers Treatment Activities: Patient referred for home evaluation of offloading devices/mattresses : 03/03/2019 Patient referred for pressure reduction/relief devices : 03/03/2019 Pressure reduction/relief device ordered : 03/03/2019 Notes: Wound/Skin  Impairment Nursing Diagnoses: Impaired tissue integrity Goals: Ulcer/skin breakdown will have [name] volume reduction of 30% by week 4 Date Initiated: 03/03/2019 Target Resolution Date: 03/03/2019 Goal Status: Active Interventions: Assess ulceration(s) every visit Treatment Activities: Skin care regimen initiated : 03/03/2019 Notes: Electronic Signature(s) Signed: 09/29/2019 11:19:24 AM By: Rodell Perna Entered By: Rodell Perna on 09/29/2019 09:15:02 [name] [name], [name] [name]. ([id number]) -------------------------------------------------------------------------------- Pain Assessment Details Patient Name: [name] [name]. Date of Service: 09/29/2019 8:30 AM Medical Record Number: [id number] Patient Account Number: [id number] Date of Birth/Sex: 04/04/1929 (86 y.o. F) Treating RN: Curtis Sites Primary Care Rodney Yera: Dewaine Oats Other Clinician: Referring Ketara Cavness: Dewaine Oats Treating Shavonda Wiedman/Extender: Altamese [place] in Treatment: 30 Active Problems Location of Pain Severity and Description of Pain Patient Has Paino Patient Unable to Respond Site Locations Pain Management and Medication Current Pain Management: Electronic Signature(s) Signed: 09/29/2019 5:03:46 PM By: Curtis Sites Entered By: Curtis Sites on 09/29/2019 08:53:22 [place], [name] [name] ([id number]) -------------------------------------------------------------------------------- Patient/Caregiver Education Details Patient Name: [name] [name]. Date of Service: 09/29/2019 8:30 AM  Medical Record Number: [id number] Patient Account Number: [id number] Date of Birth/Gender: [birth year] (86 y.o. F) Treating RN: Rodell Perna Primary Care Physician: Dewaine Oats Other Clinician: Referring Physician: Dewaine Oats Treating Physician/Extender: Altamese [place] in Treatment: 30 Education Assessment Education Provided To: Patient Education Topics Provided Wound/Skin Impairment: Handouts: Caring for Your Ulcer Methods: Demonstration,  Explain/Verbal Responses: State content correctly Electronic Signature(s) Signed: 09/29/2019 11:19:24 AM By: Rodell Perna Entered By: Rodell Perna on 09/29/2019 09:16:28 [name] [name]. ([id number]) -------------------------------------------------------------------------------- Wound Assessment Details Patient Name: [name] [name]. Date of Service: 09/29/2019 8:30 AM Medical Record Number: [id number] Patient Account Number: [id number] Date of Birth/Sex: [birth year] (86 y.o. F) Treating RN: Curtis Sites Primary Care Jordie Skalsky: Dewaine Oats Other Clinician: Referring Sierra Bissonette: Dewaine Oats Treating Krystyne T[place]/Extender: Altamese [place] in Treatment: 30 Wound Status Wound Number: 4 Primary Etiology: Pressure Ulcer Wound Location: Midline Sacrum Wound Status: Open Wounding Event: Pressure Injury Comorbid History: Glaucoma, Hypertension, Osteoarthritis, Dementia Date Acquired: 07/18/2018 Weeks Of Treatment: 30 Clustered Wound: Yes Photos Wound Measurements Length: (cm) 4.2 Width: (cm) 4 Depth: (cm) 2.5 Clustered Quantity: 1 Area: (cm) 13.195 Volume: (cm) 32.987 % Reduction in Area: 23.6% % Reduction in Volume: -1809% Epithelialization: Medium (34-66%) Tunneling: No Undermining: No Wound Description Classification: Category/Stage IV Wound Margin: Indistinct, nonvisible Exudate Amount: Medium Exudate Type: Serous Exudate Color: amber Foul Odor After Cleansing: No Slough/Fibrino Yes Wound Bed Granulation Amount: Large (67-100%) Exposed Structure Granulation Quality: Red Fascia Exposed: No Necrotic Amount: Small (1-33%) Fat Layer (Subcutaneous Tissue) Exposed: Yes Necrotic Quality: Adherent Slough Tendon Exposed: No Muscle Exposed: No Joint Exposed: No Bone Exposed: No Treatment Notes Wound #4 (Midline Sacrum) Notes prisma, wet to dry with ABD and tape to midline sacrum [name] [name], [name] [name]. ([id number]) Electronic Signature(s) Signed: 09/29/2019 5:03:46 PM By: Curtis Sites Entered By: Curtis Sites on 09/29/2019 09:07:03 [name] [name]. ([id number]) -------------------------------------------------------------------------------- Vitals Details Patient Name: [name] [name]. Date of Service: 09/29/2019 8:30 AM Medical Record Number: [id number] Patient Account Number: [id number] Date of Birth/Sex: [birth year] (86 y.o. F) Treating RN: Curtis Sites Primary Care Janaria Mccammon: Dewaine Oats Other Clinician: Referring Klair Leising: TATE, Katherina Right Treating Subhan Hoopes/Extender: Altamese [place] in Treatment: 30 Vital Signs Time Taken: 08:53 Temperature (F): 98.3 Height (in): 67 Pulse (bpm): 69 Weight (lbs): 140 Respiratory Rate (breaths/min): 16 Body Mass Index (BMI): 21.9 Blood Pressure (mmHg): 105/71 Reference Range: 80 - 120 mg / dl Electronic Signature(s) Signed: 09/29/2019 5:03:46 PM By: Curtis Sites Entered By: Curtis Sites on 09/29/2019 09:05:29

## 2019-10-13 ENCOUNTER — Ambulatory Visit: Payer: Medicare Other | Admitting: Physician Assistant

## 2019-10-20 ENCOUNTER — Encounter: Payer: Medicare Other | Attending: Physician Assistant | Admitting: Physician Assistant

## 2019-10-20 ENCOUNTER — Other Ambulatory Visit: Payer: Self-pay

## 2019-10-20 DIAGNOSIS — F0150 Vascular dementia without behavioral disturbance: Secondary | ICD-10-CM | POA: Insufficient documentation

## 2019-10-20 DIAGNOSIS — L89153 Pressure ulcer of sacral region, stage 3: Secondary | ICD-10-CM | POA: Insufficient documentation

## 2019-10-20 NOTE — Progress Notes (Signed)
[name] [name] ([id number]) Visit Report for 10/20/2019 Arrival Information Details Patient Name: [name], [name] Date of Service: 10/20/2019 10:45 AM Medical Record Number: [id number] Patient Account Number: [id number] Date of Birth/Sex: 1929-02-22 (86 y.o. F) Treating RN: Huel Coventry Primary Care Erick Oxendine: Dewaine Oats Other Clinician: Referring Derico Mitton: Dewaine Oats Treating Gardy Montanari/Extender: Linwood Dibbles, HOYT Weeks in Treatment: 33 Visit Information History Since Last Visit Has Dressing in Place as Prescribed: Yes Patient Arrived: Wheel Chair Pain Present Now: No Arrival Time: 10:50 Accompanied By: caregiver Transfer Assistance: Michiel Sites Lift Patient Identification Verified: Yes Secondary Verification Process Completed: Yes Electronic Signature(s) Signed: 10/20/2019 1:40:27 PM By: Elliot Gurney, BSN, RN, CWS, Kim RN, BSN Entered By: Elliot Gurney, BSN, RN, CWS, Kim on 10/20/2019 10:50:59 [name] [name] ([id number]) -------------------------------------------------------------------------------- Clinic Level of Care Assessment Details Patient Name: [name] Lye A. Date of Service: 10/20/2019 10:45 AM Medical Record Number: [id number] Patient Account Number: [id number] Date of Birth/Sex: 04-05-1929 (86 y.o. F) Treating RN: Rodell Perna Primary Care Rexford Prevo: Dewaine Oats Other Clinician: Referring Espyn Radwan: Dewaine Oats Treating Phillip Sandler/Extender: Linwood Dibbles, HOYT Weeks in Treatment: 33 Clinic Level of Care Assessment Items TOOL 4 Quantity Score []  - Use when only an EandM is performed on FOLLOW-UP visit 0 ASSESSMENTS - Nursing Assessment / Reassessment X - Reassessment of Co-morbidities (includes updates in patient status) 1 10 X- 1 5 Reassessment of Adherence to Treatment Plan ASSESSMENTS - Wound and Skin Assessment / Reassessment X - Simple Wound Assessment / Reassessment - one wound 1 5 []  - 0 Complex Wound Assessment / Reassessment - multiple wounds []  - 0 Dermatologic / Skin Assessment (not related to  wound area) ASSESSMENTS - Focused Assessment []  - Circumferential Edema Measurements - multi extremities 0 []  - 0 Nutritional Assessment / Counseling / Intervention []  - 0 Lower Extremity Assessment (monofilament, tuning fork, pulses) []  - 0 Peripheral Arterial Disease Assessment (using hand held doppler) ASSESSMENTS - Ostomy and/or Continence Assessment and Care []  - Incontinence Assessment and Management 0 []  - 0 Ostomy Care Assessment and Management (repouching, etc.) PROCESS - Coordination of Care X - Simple Patient / Family Education for ongoing care 1 15 []  - 0 Complex (extensive) Patient / Family Education for ongoing care []  - 0 Staff obtains , Records, Test Results / Process Orders []  - 0 Staff telephones HHA, Nursing Homes / Clarify orders / etc []  - 0 Routine Transfer to another Facility (non-emergent condition) []  - 0 Routine Hospital Admission (non-emergent condition) []  - 0 New Admissions / / Ordering NPWT, Apligraf, etc. []  - 0 Emergency Hospital Admission (emergent condition) X- 1 10 Simple Discharge Coordination []  - 0 Complex (extensive) Discharge Coordination PROCESS - Special Needs []  - Pediatric / Minor Patient Management 0 []  - 0 Isolation Patient Management []  - 0 Hearing / Language / Visual special needs []  - 0 Assessment of Community assistance (transportation, D/C planning, etc.) []  - 0 Additional assistance / Altered mentation []  - 0 Support Surface(s) Assessment (bed, cushion, seat, etc.) INTERVENTIONS - Wound Cleansing / Measurement [name] [name], [name] ( ) X- 1 5 Simple Wound Cleansing - one wound []  - 0 Complex Wound Cleansing - multiple wounds X- 1 5 Wound Imaging (photographs - any number of wounds) []  - 0 Wound Tracing (instead of photographs) X- 1 5 Simple Wound Measurement - one wound []  - 0 Complex Wound Measurement - multiple wounds INTERVENTIONS - Wound Dressings []  - Small Wound  Dressing one or multiple wounds 0 X- 1 15 Medium Wound Dressing one or multiple  wounds []  - 0 Large Wound Dressing one or multiple wounds []  - 0 Application of Medications - topical []  - 0 Application of Medications - injection INTERVENTIONS - Miscellaneous []  - External ear exam 0 []  - 0 Specimen Collection (cultures, biopsies, blood, body fluids, etc.) []  - 0 Specimen(s) / Culture(s) sent or taken to Lab for analysis []  - 0 Patient Transfer (multiple staff / Harrel Lemon Lift / Similar devices) []  - 0 Simple Staple / Suture removal (25 or less) []  - 0 Complex Staple / Suture removal (26 or more) []  - 0 Hypo / Hyperglycemic Management (close monitor of Blood Glucose) []  - 0 Ankle / Brachial Index (ABI) - do not check if billed separately X- 1 5 Vital Signs Has the patient been seen at the hospital within the last three years: Yes Total Score: 80 Level Of Care: New/Established - Level 3 Electronic Signature(s) Signed: 10/20/2019 11:31:22 AM By: Army Melia Entered By: Army Melia on 10/20/2019 11:19:34 [name] Roan A. ([id number]) -------------------------------------------------------------------------------- Encounter Discharge Information Details Patient Name: [name] Roan A. Date of Service: 10/20/2019 10:45 AM Medical Record Number: [id number] Patient Account Number: [id number] Date of Birth/Sex: 02/14/1929 (86 y.o. F) Treating RN: Army Melia Primary Care Tangee Marszalek: Benita Stabile Other Clinician: Referring Vylette Strubel: Benita Stabile Treating Fianna Snowball/Extender: Melburn Hake, HOYT Weeks in Treatment: 48 Encounter Discharge Information Items Discharge Condition: Stable Ambulatory Status: Wheelchair Discharge Destination: Home Transportation: Private Auto Accompanied By: family Schedule Follow-up Appointment: Yes Clinical Summary of Care: Electronic Signature(s) Signed: 10/20/2019 11:31:22 AM By: Army Melia Entered By: Army Melia on 10/20/2019 11:20:14 [name] Roan A.  ([id number]) -------------------------------------------------------------------------------- Lower Extremity Assessment Details Patient Name: [name] Roan A. Date of Service: 10/20/2019 10:45 AM Medical Record Number: [id number] Patient Account Number: [id number] Date of Birth/Sex: 1928-06-18 (86 y.o. F) Treating RN: Cornell Barman Primary Care Zakee Deerman: Benita Stabile Other Clinician: Referring Woodard Perrell: Benita Stabile Treating Lurline Caver/Extender: Sharalyn Ink in Treatment: 47 Electronic Signature(s) Signed: 10/20/2019 1:40:27 PM By: Gretta Cool, BSN, RN, CWS, Kim RN, BSN Entered By: Gretta Cool, BSN, RN, CWS, Kim on 10/20/2019 11:06:45 Susa Griffins ([id number]) -------------------------------------------------------------------------------- Multi Wound Chart Details Patient Name: [name] Roan A. Date of Service: 10/20/2019 10:45 AM Medical Record Number: [id number] Patient Account Number: [id number] Date of Birth/Sex: 1928-11-16 (86 y.o. F) Treating RN: Army Melia Primary Care Novak Stgermaine: Benita Stabile Other Clinician: Referring Jenai Scaletta: TATE, Sharlet Salina Treating Tabor Bartram/Extender: STONE III, HOYT Weeks in Treatment: 33 Vital Signs Height(in): 67 Pulse(bpm): 65 Weight(lbs): 140 Blood Pressure(mmHg): 116/55 Body Mass Index(BMI): 22 Temperature(F): 98.2 Respiratory Rate(breaths/min): 16 Photos: [N/A:N/A] Wound Location: Midline Sacrum N/A N/A Wounding Event: Pressure Injury N/A N/A Primary Etiology: Pressure Ulcer N/A N/A Comorbid History: Glaucoma, Hypertension, N/A N/A Osteoarthritis, Dementia Date Acquired: 07/18/2018 N/A N/A Weeks of Treatment: 33 N/A N/A Wound Status: Open N/A N/A Clustered Wound: Yes N/A N/A Clustered Quantity: 1 N/A N/A Measurements L x W x D (cm) 3.9x3x1.5 N/A N/A Area (cm) : 9.189 N/A N/A Volume (cm) : 13.784 N/A N/A % Reduction in Area: 46.80% N/A N/A % Reduction in Volume: -697.70% N/A N/A Starting Position 1 (o'clock): 1 Ending Position 1 (o'clock): 1 Maximum  Distance 1 (cm): 1.8 Undermining: Yes N/A N/A Classification: Category/Stage IV N/A N/A Exudate Amount: Medium N/A N/A Exudate Type: Serous N/A N/A Exudate Color: amber N/A N/A Wound Margin: Indistinct, nonvisible N/A N/A Granulation Amount: Large (67-100%) N/A N/A Granulation Quality: Red N/A N/A Necrotic Amount: Small (1-33%) N/A N/A Exposed Structures: Fat Layer (Subcutaneous Tissue) N/A N/A Exposed: Yes Fascia: No Tendon: No Muscle:  No Joint: No Bone: No Epithelialization: Small (1-33%) N/A N/A Treatment Notes Electronic Signature(s) Signed: 10/20/2019 11:31:22 AM By: Will Bonnet, Evi A. ([id number]) Entered By: Rodell Perna on 10/20/2019 11:18:16 [name] [name] ([id number]) -------------------------------------------------------------------------------- Multi-Disciplinary Care Plan Details Patient Name: [name] Lye A. Date of Service: 10/20/2019 10:45 AM Medical Record Number: [id number] Patient Account Number: [id number] Date of Birth/Sex: 07-16-1928 (86 y.o. F) Treating RN: Rodell Perna Primary Care Riggin Cuttino: Dewaine Oats Other Clinician: Referring Catie Chiao: Dewaine Oats Treating Djuana Littleton/Extender: Linwood Dibbles, HOYT Weeks in Treatment: 33 Active Inactive Abuse / Safety / Falls / Self Care Management Nursing Diagnoses: Potential for falls Goals: Patient will remain injury free related to falls Date Initiated: 03/03/2019 Target Resolution Date: 03/16/2019 Goal Status: Active Interventions: Assess fall risk on admission and as needed Notes: Medication Nursing Diagnoses: Knowledge deficit related to medication safety: actual or potential Goals: Patient/caregiver will demonstrate understanding of all current medications Date Initiated: 03/03/2019 Target Resolution Date: 03/16/2019 Goal Status: Active Interventions: Assess for medication contraindications each visit where new medications are prescribed Treatment Activities: New medication prescribed at Wound  Center : 03/03/2019 Notes: Necrotic Tissue Nursing Diagnoses: Impaired tissue integrity related to necrotic/devitalized tissue Goals: Necrotic/devitalized tissue will be minimized in the wound bed Date Initiated: 03/03/2019 Target Resolution Date: 03/16/2019 Goal Status: Active Interventions: Assess patient pain level pre-, during and post procedure and prior to discharge Treatment Activities: Apply topical anesthetic as ordered : 03/03/2019 Notes: Nutrition Nursing Diagnoses: Potential for alteratiion in Nutrition/Potential for imbalanced nutrition Gang, Shaquisha A. ([id number]) Goals: Patient/caregiver agrees to and verbalizes understanding of need to use nutritional supplements and/or vitamins as prescribed Date Initiated: 03/03/2019 Target Resolution Date: 03/16/2019 Goal Status: Active Interventions: Provide education on nutrition Notes: Pressure Nursing Diagnoses: Knowledge deficit related to management of pressures ulcers Goals: Patient/caregiver will verbalize understanding of pressure ulcer management Date Initiated: 03/03/2019 Target Resolution Date: 03/16/2019 Goal Status: Active Interventions: Assess: immobility, friction, shearing, incontinence upon admission and as needed Provide education on pressure ulcers Treatment Activities: Patient referred for home evaluation of offloading devices/mattresses : 03/03/2019 Patient referred for pressure reduction/relief devices : 03/03/2019 Pressure reduction/relief device ordered : 03/03/2019 Notes: Wound/Skin Impairment Nursing Diagnoses: Impaired tissue integrity Goals: Ulcer/skin breakdown will have a volume reduction of 30% by week 4 Date Initiated: 03/03/2019 Target Resolution Date: 03/03/2019 Goal Status: Active Interventions: Assess ulceration(s) every visit Treatment Activities: Skin care regimen initiated : 03/03/2019 Notes: Electronic Signature(s) Signed: 10/20/2019 11:31:22 AM By: Rodell Perna Entered  By: Rodell Perna on 10/20/2019 11:18:06 [name] [name], [name] ([id number]) -------------------------------------------------------------------------------- Pain Assessment Details Patient Name: [name] Lye A. Date of Service: 10/20/2019 10:45 AM Medical Record Number: [id number] Patient Account Number: [id number] Date of Birth/Sex: [birth year] (86 y.o. F) Treating RN: Huel Coventry Primary Care Sumiko Ceasar: Dewaine Oats Other Clinician: Referring Shalana Jardin: Dewaine Oats Treating Jelina Paulsen/Extender: Linwood Dibbles, HOYT Weeks in Treatment: 33 Active Problems Location of Pain Severity and Description of Pain Patient Has Paino No Site Locations Pain Management and Medication Current Pain Management: Notes Patient denies pain at this time. Electronic Signature(s) Signed: 10/20/2019 1:40:27 PM By: Elliot Gurney, BSN, RN, CWS, Kim RN, BSN Entered By: Elliot Gurney, BSN, RN, CWS, Kim on 10/20/2019 10:52:33 [name] [name] ([id number]) -------------------------------------------------------------------------------- Patient/Caregiver Education Details Patient Name: [name] Lye A. Date of Service: 10/20/2019 10:45 AM Medical Record Number: [id number] Patient Account Number: [id number] Date of Birth/Gender: 09-16-1928 (86 y.o. F) Treating RN: Rodell Perna Primary Care Physician: Dewaine Oats Other Clinician: Referring Physician: Dewaine Oats Treating Physician/Extender: Linwood Dibbles, HOYT Weeks in Treatment: 38 Education Assessment  Education Provided To: Patient Education Topics Provided Wound/Skin Impairment: Handouts: Caring for Your Ulcer Methods: Demonstration, Explain/Verbal Responses: State content correctly Electronic Signature(s) Signed: 10/20/2019 11:31:22 AM By: Rodell Perna Entered By: Rodell Perna on 10/20/2019 11:19:47 Barton, Aileen A. ([id number]) -------------------------------------------------------------------------------- Wound Assessment Details Patient Name: [name] Lye A. Date of Service: 10/20/2019 10:45 AM Medical  Record Number: [id number] Patient Account Number: [id number] Date of Birth/Sex: 11/13/1928 (86 y.o. F) Treating RN: Huel Coventry Primary Care Kraig Genis: Dewaine Oats Other Clinician: Referring Jemarion Roycroft: Dewaine Oats Treating Abisai Coble/Extender: Linwood Dibbles, HOYT Weeks in Treatment: 33 Wound Status Wound Number: 4 Primary Etiology: Pressure Ulcer Wound Location: Midline Sacrum Wound Status: Open Wounding Event: Pressure Injury Comorbid History: Glaucoma, Hypertension, Osteoarthritis, Dementia Date Acquired: 07/18/2018 Weeks Of Treatment: 33 Clustered Wound: Yes Photos Wound Measurements Length: (cm) 3.9 Width: (cm) 3 Depth: (cm) 1.5 Clustered Quantity: 1 Area: (cm) 9.189 Volume: (cm) 13.784 % Reduction in Area: 46.8% % Reduction in Volume: -697.7% Epithelialization: Small (1-33%) Undermining: Yes Starting Position (o'clock): 1 Ending Position (o'clock): 1 Maximum Distance: (cm) 1.8 Wound Description Classification: Category/Stage IV Wound Margin: Indistinct, nonvisible Exudate Amount: Medium Exudate Type: Serous Exudate Color: amber Foul Odor After Cleansing: No Slough/Fibrino Yes Wound Bed Granulation Amount: Large (67-100%) Exposed Structure Granulation Quality: Red Fascia Exposed: No Necrotic Amount: Small (1-33%) Fat Layer (Subcutaneous Tissue) Exposed: Yes Necrotic Quality: Adherent Slough Tendon Exposed: No Muscle Exposed: No Joint Exposed: No Bone Exposed: No Treatment Notes Wound #4 (Midline Sacrum) Notes prisma, wet to dry with ABD and tape to midline sacrum [name] [name], [name] ([id number]) Electronic Signature(s) Signed: 10/20/2019 1:40:27 PM By: Elliot Gurney, BSN, RN, CWS, Kim RN, BSN Entered By: Elliot Gurney, BSN, RN, CWS, Kim on 10/20/2019 11:03:10 [name] Lye A. ([id number]) -------------------------------------------------------------------------------- Vitals Details Patient Name: [name] Lye A. Date of Service: 10/20/2019 10:45 AM Medical Record Number: [id number] Patient  Account Number: [id number] Date of Birth/Sex: 10/19/1928 (86 y.o. F) Treating RN: Huel Coventry Primary Care Lavra Imler: Dewaine Oats Other Clinician: Referring Dajha Urquilla: Dewaine Oats Treating Ashe Graybeal/Extender: Linwood Dibbles, HOYT Weeks in Treatment: 33 Vital Signs Time Taken: 10:51 Temperature (F): 98.2 Height (in): 67 Pulse (bpm): 65 Weight (lbs): 140 Respiratory Rate (breaths/min): 16 Body Mass Index (BMI): 21.9 Blood Pressure (mmHg): 116/55 Reference Range: 80 - 120 mg / dl Electronic Signature(s) Signed: 10/20/2019 1:40:27 PM By: Elliot Gurney, BSN, RN, CWS, Kim RN, BSN Entered By: Elliot Gurney, BSN, RN, CWS, Kim on 10/20/2019 10:51:42

## 2019-10-22 NOTE — Progress Notes (Signed)
[name] [name] ([id number]) Visit Report for 10/20/2019 Chief Complaint Document Details Patient Name: [name], [name] Date of Service: 10/20/2019 10:45 AM Medical Record Number: [id number] Patient Account Number: [id number] Date of Birth/Sex: [birth year] (86 y.o. F) Treating RN: Rodell Perna Primary Care Provider: Dewaine Oats Other Clinician: Referring Provider: Dewaine Oats Treating Provider/Extender: Linwood Dibbles, Jnaya Butrick Weeks in Treatment: 33 Information Obtained from: Patient Chief Complaint Multiple pressure ulcers Electronic Signature(s) Signed: 10/20/2019 1:02:23 PM By: Lenda Kelp PA-C Entered By: Lenda Kelp on 10/20/2019 13:02:22 [name] Lye A. ([id number]) -------------------------------------------------------------------------------- HPI Details Patient Name: [name] Lye A. Date of Service: 10/20/2019 10:45 AM Medical Record Number: [id number] Patient Account Number: [id number] Date of Birth/Sex: [birth year] (86 y.o. F) Treating RN: Rodell Perna Primary Care Provider: Dewaine Oats Other Clinician: Referring Provider: Dewaine Oats Treating Provider/Extender: Linwood Dibbles, Jemya Depierro Weeks in Treatment: 33 History of Present Illness HPI Description: 03/03/2019 on evaluation today patient presents for initial evaluation here in the clinic today concerning issues that she has been having with wounds which are pressure in nature at multiple locations currently. This is on her left heel, right ankle, left hip/ischial location, and all her to varying degrees of severity and depth. The worst is on the left ischial location. At this time the patient has been tolerating dressing changes although I am not exactly sure the dressings that have been utilized prior to coming in today. She does have some necrotic tissue noted at several locations this can require at least some degree of cleaning prior to application of dressings going forward. The patient does have dementia and unfortunately in recent months  has become increasingly immobile requiring more significant treatment she was just recently moved roughly 2 weeks ago from assisted living to a skilled nursing facility at [facility]. Based on what I am seeing currently these pressure injuries occurred prior to that 2-week time I do not see any new or obvious pressure injury at this point. I discussed with the patient as well as her family member that this does appear to be doing better in my opinion than likely where things started. They are unsure as to whether or not she has an air mattress at the facility I do think that would be something that would be appropriate and good for her to have but at the moment I do not know for sure whether that is already in place if not that definitely is can be 1 of the recommendations. Also think a Roho cushion for her wheelchair would be good as well as Prevalon offloading boots. The patient currently does not have any severe pain although when I was cleaning some of the areas she did note there was some discomfort at several of the locations. She does have a history of hypertension as well. 10/29; this is a patient from [facility] nursing home. She is nonambulatory. She is here for follow-up on 4 different wounds 1 over the left lateral malleolus, the right medial calcaneus, the left posterior hip and I believe a new area over the sacrum. The patient is nonambulatory but apparently eats well. 03/24/2019 on evaluation today patient actually appears to be doing better with regard to her wounds in general based on what I am seeing. Fortunately there is no signs of active infection at this time. No fever chills noted. Overall been very pleased with the progress that seems to be occurring since I last saw her. There is a little bit of debridement That will need to be performed  today. 04/07/2019 on evaluation today patient actually appears to be doing well in some regards based on what I am seeing today.  Fortunately there is no signs of active infection at this time which is good news. No fever chills noted. With that being said the wounds on her foot and ankle region in general have healed at this point. I do not see anything open in that region. With regard to the other 2 wounds in the left ischium and midline sacral region I believe that we may need to make a dressing change at this point to do something a little bit more conducive to cleaning up the wound bed. The patient is in agreement with that plan today. 04/21/2019 on evaluation today patient actually appears to be doing well with regard to her wounds all things considered. Both appear to be much cleaner than what they were previous. Fortunately there is no signs of active infection at this time. Both I think are doing well with the current wound care measures which is the Dakin moistened gauze dressings. 05/05/2019 on evaluation today patient seems to be doing much better in regard to her ischial ulcer unfortunately she still seems to be getting pressure to the sacral region. I think that this is evidenced by the fact that she does have some surrounding deep tissue injury and the fact that the wound is actually measuring larger not better. Fortunately there is no evidence of active infection at this time I do believe the Dakin's is still the best option treatment wise but again I do think the patient needs more appropriate offloading on the sacral region. 06/24/2019 on evaluation today patient appears to be doing well with regard to the wound over the ischium which appears to be completely healed. This is great news. Fortunately there does appear to be some improvement with regard to the sacral wound as well. There still does seem to be evidence of necrotic tissue noted minimally that I was able to gently clean away. With that being said the wound itself seems to be doing quite well which is good news. She seems to be offloaded appropriately in  my opinion at this point. I think she may be ready for a wound VAC. 07/14/2019 on evaluation today patient appears to be doing well with regard to her sacral wound in general although unfortunately she does appear to have some evidence of potential infection there is some odor that seems to be a little stronger than just what would normally be noted with the wound VAC. With that being said I do think we need to address this and though I think the wound VAC is appropriate to continue I think that we do need to see about a culture as well as placing her on some antibiotic therapy at this point. The patient's granddaughter was present during evaluation today as well. 07/28/2019 upon evaluation today patient unfortunately appears to not be doing quite as well as I would like with regard to the wound VAC and really was not applied appropriately. The phone was applied directly to the skin which did cause a little bit of breakdown around the opening of the wound. Subsequently is also unfortunately the case that the foam that was down at the sacral region and that was tracked up and along her back was not actually connected so they touched this obviously inhibits the ability to be able to properly drain fluid from 1 to the other the have to be continuous and touching. With  that being said there was some improvement today in the overall appearance of the wound bed though obviously size wise not much difference today. We are going to get in touch with the facility to try to make sure that they know what they are doing with applying the wound VAC I am also can likely see her for more frequent follow-up for the time being. All this was discussed with the patient's grandson during the office visit today as well. 08/11/19 upon evaluation today patient appears to be doing poorly in regard to her wound at this time. Unfortunately there appears to be infection at this time including blue-green drainage and a poor quality  to the granulation tissue all of which point toward infection she also seems to be much more painful than we noticed in the past. She is moving around a lot just with very light touch around the area I think that is an indication of discomfort 08/18/2019 upon evaluation today patient appears to be doing excellent in regard to her wound as compared to last evaluation. There is still some odor but she does not have as much of the necrotic tissue nor the significant drainage that I noted at the last visit. There still does appear to be evidence of infection and she still is much more tender than what she was experiencing prior to the infection noted last week. 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 I do believe that she likely is getting need to see infectious Eppolito, Janaysha A. ([id number]) disease due to the ongoing nature of this infection. For now would you still have to hold off on reinitiating the wound VAC. 08/25/2019 upon evaluation today patient appears to be doing excellent with regard to the overall appearance of the wound though there is still is an odor and I do think that though the wound VAC would be best for her were not quite at the point of getting that on yet she actually has her appointment with infectious disease this afternoon it sounds like this will be with Dr. Sampson Goon. Fortunately there is no signs of systemic infection at this time. 09/01/2019 upon evaluation today patient appears to be doing excellent at this point in regard to her sacral wound. In fact I do not even notice any odor once everything was cleaned away and I came into the room to evaluate the patient the wound seems to be doing very well. There is excellent granulation, no necrotic tissue, and overall I think the Dakin's solution has done extremely well. I did talk with Dr. Sampson Goon who saw the patient in the interim earlier this week and of note her lab  work and everything else checked out just fine and Dr. Sampson Goon really was not concerned about the possibility of the need for IV antibiotic therapy. Obviously if anything worsens or changes in that may still be a consideration but right now she seems to actually be doing extremely well on all fronts. I think the Dakin's moistened gauze is actually a very good option for her to continue at this point. 09/15/2019 upon evaluation today patient appears to be doing well with regard to her wound in general. Fortunately there is no signs of significant infection overall I feel like she is doing excellent in that regard. With that being said I think the Dakin's moistened gauze packing has done extremely well for her 5/13; 2-week follow-up. The patient has a deep wound on  the lower sacral area however generally healthy looking surface over the bone here. There was no palpable bone no surrounding infection. I see that they have been using Dakin's moistened wet-to-dry. A wound VAC is apparently on hold. 10/20/2019 upon evaluation today patient's wound bed actually showed signs of good granulation and epithelization she seems to be doing excellent as far as the continuation of feeling of the wound is concerned there does not appear to be any significant slough or breakdown and no significant infection at this point. Overall I am extremely pleased with where things stand today. Her family member who is here today is very happy to hear this. Electronic Signature(s) Signed: 10/20/2019 4:57:11 PM By: Lenda Kelp PA-C Entered By: Lenda Kelp on 10/20/2019 16:57:10 [name] [name], [name] [name] ([id number]) -------------------------------------------------------------------------------- Physical Exam Details Patient Name: [name] Lye A. Date of Service: 10/20/2019 10:45 AM Medical Record Number: [id number] Patient Account Number: [id number] Date of Birth/Sex: 1929-02-19 (86 y.o. F) Treating RN: Rodell Perna Primary Care  Provider: Dewaine Oats Other Clinician: Referring Provider: TATE, Katherina Right Treating Provider/Extender: STONE III, Jhordan Kinter Weeks in Treatment: 33 Constitutional Chronically ill appearing but in no apparent acute distress. Respiratory normal breathing without difficulty. Psychiatric Patient is not able to cooperate in decision making regarding care. Patient has dementia. pleasant and cooperative. Notes Upon inspection patient's wound bed again showed signs of good granulation at this time. There does not appear to be any evidence of infection and overall I think that she is actually doing quite well as far as healing is concerned. Electronic Signature(s) Signed: 10/20/2019 4:57:47 PM By: Lenda Kelp PA-C Entered By: Lenda Kelp on 10/20/2019 16:57:47 [name] [name], [name] [name] ([id number]) -------------------------------------------------------------------------------- Physician Orders Details Patient Name: [name] Lye A. Date of Service: 10/20/2019 10:45 AM Medical Record Number: [id number] Patient Account Number: [id number] Date of Birth/Sex: [birth year] (86 y.o. F) Treating RN: Rodell Perna Primary Care Provider: Dewaine Oats Other Clinician: Referring Provider: Dewaine Oats Treating Provider/Extender: STONE III, Connie Lasater Weeks in Treatment: 12 Verbal / Phone Orders: No Diagnosis Coding Wound Cleansing Wound #4 Midline Sacrum o Clean wound with Normal Saline. o Cleanse wound with mild soap and water Primary Wound Dressing Wound #4 Midline Sacrum o Silver Collagen - moistened with hydrogel Secondary Dressing Wound #4 Midline Sacrum o ABD pad - secure with tape o Saline moistened gauze - wet to dry packing Dressing Change Frequency Wound #4 Midline Sacrum o Change dressing every other day. Follow-up Appointments Wound #4 Midline Sacrum o Return Appointment in 2 weeks. Off-Loading Wound #4 Midline Sacrum o Roho cushion for wheelchair - pt needs Roho cushion for her chair o  Mattress - Pt needs Air mattress o Turn and reposition every 2 hours - Pt is still getting pressure to her midline sacrum. Please reposition pt EVERY 2 HOURS Negative Pressure Wound Therapy Wound #4 Midline Sacrum o Place NPWT on HOLD. - Until infection is under control Electronic Signature(s) Signed: 10/20/2019 11:31:22 AM By: Rodell Perna Signed: 10/21/2019 6:04:00 PM By: Lenda Kelp PA-C Entered By: Rodell Perna on 10/20/2019 11:19:02 Guerrier, [name] ([id number]) -------------------------------------------------------------------------------- Problem List Details Patient Name: [name] Lye A. Date of Service: 10/20/2019 10:45 AM Medical Record Number: [id number] Patient Account Number: [id number] Date of Birth/Sex: 09-09-1928 (86 y.o. F) Treating RN: Rodell Perna Primary Care Provider: Dewaine Oats Other Clinician: Referring Provider: Dewaine Oats Treating Provider/Extender: Linwood Dibbles, Tran Randle Weeks in Treatment: 65 Active Problems ICD-10 Encounter Code Description Active Date MDM Diagnosis L89.153 Pressure ulcer of sacral region, stage 3 03/24/2019  No Yes I10 Essential (primary) hypertension 03/03/2019 No Yes F01.50 Vascular dementia without behavioral disturbance 03/03/2019 No Yes Inactive Problems ICD-10 Code Description Active Date Inactive Date L89.623 Pressure ulcer of left heel, stage 3 03/03/2019 03/03/2019 L89.513 Pressure ulcer of right ankle, stage 3 03/03/2019 03/03/2019 L89.224 Pressure ulcer of left hip, stage 4 03/03/2019 03/03/2019 Resolved Problems Electronic Signature(s) Signed: 10/20/2019 1:02:16 PM By: Worthy Keeler PA-C Entered By: Worthy Keeler on 10/20/2019 13:02:16 [name] Roan A. ([id number]) -------------------------------------------------------------------------------- Progress Note Details Patient Name: [name] Roan A. Date of Service: 10/20/2019 10:45 AM Medical Record Number: [id number] Patient Account Number: [id number] Date of Birth/Sex: Mar 25, 1929 (86  y.o. F) Treating RN: Army Melia Primary Care Provider: Benita Stabile Other Clinician: Referring Provider: Benita Stabile Treating Provider/Extender: Melburn Hake, Gautham Hewins Weeks in Treatment: 33 Subjective Chief Complaint Information obtained from Patient Multiple pressure ulcers History of Present Illness (HPI) 03/03/2019 on evaluation today patient presents for initial evaluation here in the clinic today concerning issues that she has been having with wounds which are pressure in nature at multiple locations currently. This is on her left heel, right ankle, left hip/ischial location, and all her to varying degrees of severity and depth. The worst is on the left ischial location. At this time the patient has been tolerating dressing changes although I am not exactly sure the dressings that have been utilized prior to coming in today. She does have some necrotic tissue noted at several locations this can require at least some degree of cleaning prior to application of dressings going forward. The patient does have dementia and unfortunately in recent months has become increasingly immobile requiring more significant treatment she was just recently moved roughly 2 weeks ago from assisted living to a skilled nursing facility at [facility]. Based on what I am seeing currently these pressure injuries occurred prior to that 2-week time I do not see any new or obvious pressure injury at this point. I discussed with the patient as well as her family member that this does appear to be doing better in my opinion than likely where things started. They are unsure as to whether or not she has an air mattress at the facility I do think that would be something that would be appropriate and good for her to have but at the moment I do not know for sure whether that is already in place if not that definitely is can be 1 of the recommendations. Also think a Roho cushion for her wheelchair would be good as well as  Prevalon offloading boots. The patient currently does not have any severe pain although when I was cleaning some of the areas she did note there was some discomfort at several of the locations. She does have a history of hypertension as well. 10/29; this is a patient from [facility]. She is nonambulatory. She is here for follow-up on 4 different wounds 1 over the left lateral malleolus, the right medial calcaneus, the left posterior hip and I believe a new area over the sacrum. The patient is nonambulatory but apparently eats well. 03/24/2019 on evaluation today patient actually appears to be doing better with regard to her wounds in general based on what I am seeing. Fortunately there is no signs of active infection at this time. No fever chills noted. Overall been very pleased with the progress that seems to be occurring since I last saw her. There is a little bit of debridement That will need to be performed today.  04/07/2019 on evaluation today patient actually appears to be doing well in some regards based on what I am seeing today. Fortunately there is no signs of active infection at this time which is good news. No fever chills noted. With that being said the wounds on her foot and ankle region in general have healed at this point. I do not see anything open in that region. With regard to the other 2 wounds in the left ischium and midline sacral region I believe that we may need to make a dressing change at this point to do something a little bit more conducive to cleaning up the wound bed. The patient is in agreement with that plan today. 04/21/2019 on evaluation today patient actually appears to be doing well with regard to her wounds all things considered. Both appear to be much cleaner than what they were previous. Fortunately there is no signs of active infection at this time. Both I think are doing well with the current wound care measures which is the Dakin moistened  gauze dressings. 05/05/2019 on evaluation today patient seems to be doing much better in regard to her ischial ulcer unfortunately she still seems to be getting pressure to the sacral region. I think that this is evidenced by the fact that she does have some surrounding deep tissue injury and the fact that the wound is actually measuring larger not better. Fortunately there is no evidence of active infection at this time I do believe the Dakin's is still the best option treatment wise but again I do think the patient needs more appropriate offloading on the sacral region. 06/24/2019 on evaluation today patient appears to be doing well with regard to the wound over the ischium which appears to be completely healed. This is great news. Fortunately there does appear to be some improvement with regard to the sacral wound as well. There still does seem to be evidence of necrotic tissue noted minimally that I was able to gently clean away. With that being said the wound itself seems to be doing quite well which is good news. She seems to be offloaded appropriately in my opinion at this point. I think she may be ready for a wound VAC. 07/14/2019 on evaluation today patient appears to be doing well with regard to her sacral wound in general although unfortunately she does appear to have some evidence of potential infection there is some odor that seems to be a little stronger than just what would normally be noted with the wound VAC. With that being said I do think we need to address this and though I think the wound VAC is appropriate to continue I think that we do need to see about a culture as well as placing her on some antibiotic therapy at this point. The patient's granddaughter was present during evaluation today as well. 07/28/2019 upon evaluation today patient unfortunately appears to not be doing quite as well as I would like with regard to the wound VAC and really was not applied appropriately. The  phone was applied directly to the skin which did cause a little bit of breakdown around the opening of the wound. Subsequently is also unfortunately the case that the foam that was down at the sacral region and that was tracked up and along her back was not actually connected so they touched this obviously inhibits the ability to be able to properly drain fluid from 1 to the other the have to be continuous and touching. With that  being said there was some improvement today in the overall appearance of the wound bed though obviously size wise not much difference today. We are going to get in touch with the facility to try to make sure that they know what they are doing with applying the wound VAC I am also can likely see her for more frequent follow-up for the time being. All this was discussed with the patient's grandson during the office visit today as well. 08/11/19 upon evaluation today patient appears to be doing poorly in regard to her wound at this time. Unfortunately there appears to be infection at this time including blue-green drainage and a poor quality to the granulation tissue all of which point toward infection she also seems to be much more painful than we noticed in the past. She is moving around a lot just with very light touch around the area I think that is an indication of discomfort Budzynski, [name] ([id number]) 08/18/2019 upon evaluation today patient appears to be doing excellent in regard to her wound as compared to last evaluation. There is still some odor but she does not have as much of the necrotic tissue nor the significant drainage that I noted at the last visit. There still does appear to be evidence of infection and she still is much more tender than what she was experiencing prior to the infection noted last week. 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 I do believe that she likely is getting need to see  infectious disease due to the ongoing nature of this infection. For now would you still have to hold off on reinitiating the wound VAC. 08/25/2019 upon evaluation today patient appears to be doing excellent with regard to the overall appearance of the wound though there is still is an odor and I do think that though the wound VAC would be best for her were not quite at the point of getting that on yet she actually has her appointment with infectious disease this afternoon it sounds like this will be with Dr. Sampson GoonFitzgerald. Fortunately there is no signs of systemic infection at this time. 09/01/2019 upon evaluation today patient appears to be doing excellent at this point in regard to her sacral wound. In fact I do not even notice any odor once everything was cleaned away and I came into the room to evaluate the patient the wound seems to be doing very well. There is excellent granulation, no necrotic tissue, and overall I think the Dakin's solution has done extremely well. I did talk with Dr. Sampson GoonFitzgerald who saw the patient in the interim earlier this week and of note her lab work and everything else checked out just fine and Dr. Sampson GoonFitzgerald really was not concerned about the possibility of the need for IV antibiotic therapy. Obviously if anything worsens or changes in that may still be a consideration but right now she seems to actually be doing extremely well on all fronts. I think the Dakin's moistened gauze is actually a very good option for her to continue at this point. 09/15/2019 upon evaluation today patient appears to be doing well with regard to her wound in general. Fortunately there is no signs of significant infection overall I feel like she is doing excellent in that regard. With that being said I think the Dakin's moistened gauze packing has done extremely well for her 5/13; 2-week follow-up. The patient has a deep wound on the lower  sacral area however generally healthy looking surface over  the bone here. There was no palpable bone no surrounding infection. I see that they have been using Dakin's moistened wet-to-dry. A wound VAC is apparently on hold. 10/20/2019 upon evaluation today patient's wound bed actually showed signs of good granulation and epithelization she seems to be doing excellent as far as the continuation of feeling of the wound is concerned there does not appear to be any significant slough or breakdown and no significant infection at this point. Overall I am extremely pleased with where things stand today. Her family member who is here today is very happy to hear this. Objective Constitutional Chronically ill appearing but in no apparent acute distress. Vitals Time Taken: 10:51 AM, Height: 67 in, Weight: 140 lbs, BMI: 21.9, Temperature: 98.2 F, Pulse: 65 bpm, Respiratory Rate: 16 breaths/min, Blood Pressure: 116/55 mmHg. Respiratory normal breathing without difficulty. Psychiatric Patient is not able to cooperate in decision making regarding care. Patient has dementia. pleasant and cooperative. General Notes: Upon inspection patient's wound bed again showed signs of good granulation at this time. There does not appear to be any evidence of infection and overall I think that she is actually doing quite well as far as healing is concerned. Integumentary (Hair, Skin) Wound #4 status is Open. Original cause of wound was Pressure Injury. The wound is located on the Midline Sacrum. The wound measures 3.9cm length x 3cm width x 1.5cm depth; 9.189cm^2 area and 13.784cm^3 volume. There is Fat Layer (Subcutaneous Tissue) Exposed exposed. There is undermining starting at 1:00 and ending at 1:00 with a maximum distance of 1.8cm. There is a medium amount of serous drainage noted. The wound margin is indistinct and nonvisible. There is large (67-100%) red granulation within the wound bed. There is a small (1-33%) amount of necrotic tissue within the wound bed including  Adherent Slough. Assessment Active Problems ICD-10 Pressure ulcer of sacral region, stage 3 Essential (primary) hypertension Vascular dementia without behavioral disturbance [name] [name], [name] ([id number]) Plan Wound Cleansing: Wound #4 Midline Sacrum: Clean wound with Normal Saline. Cleanse wound with mild soap and water Primary Wound Dressing: Wound #4 Midline Sacrum: Silver Collagen - moistened with hydrogel Secondary Dressing: Wound #4 Midline Sacrum: ABD pad - secure with tape Saline moistened gauze - wet to dry packing Dressing Change Frequency: Wound #4 Midline Sacrum: Change dressing every other day. Follow-up Appointments: Wound #4 Midline Sacrum: Return Appointment in 2 weeks. Off-Loading: Wound #4 Midline Sacrum: Roho cushion for wheelchair - pt needs Roho cushion for her chair Mattress - Pt needs Air mattress Turn and reposition every 2 hours - Pt is still getting pressure to her midline sacrum. Please reposition pt EVERY 2 HOURS Negative Pressure Wound Therapy: Wound #4 Midline Sacrum: Place NPWT on HOLD. - Until infection is under control 1. I would recommend currently that we continue with the wound care measures as before utilizing the silver collagen moistened with hydrogel to the base of the wound and then subsequently saline moistened gauze over top of this to pack into the area. She seems to be doing excellent with that regimen. 2. I am also can recommend currently that she continue with appropriate offloading including the Roho cushion and air mattress. She should be offloaded every 2 hours. 3. I am also can recommend that the patient continue to monitor for any signs of infection. Or rather the facility will do this for [place]. If anything shows up they should let me know as soon as possible.  We will see patient back for reevaluation in 1 week here in the clinic. If anything worsens or changes patient will contact our office for  additional recommendations. Electronic Signature(s) Signed: 10/20/2019 4:58:39 PM By: Lenda Kelp PA-C Entered By: Lenda Kelp on 10/20/2019 16:58:39 [name] [name], [name] [name] ([id number]) -------------------------------------------------------------------------------- SuperBill Details Patient Name: [name] Lye A. Date of Service: 10/20/2019 Medical Record Number: [id number] Patient Account Number: [id number] Date of Birth/Sex: [birth year] (86 y.o. F) Treating RN: Rodell Perna Primary Care Provider: Dewaine Oats Other Clinician: Referring Provider: TATE, Katherina Right Treating Provider/Extender: STONE III, Gio Janoski Weeks in Treatment: 33 Diagnosis Coding ICD-10 Codes Code Description L89.153 Pressure ulcer of sacral region, stage 3 I10 Essential (primary) hypertension F01.50 Vascular dementia without behavioral disturbance Facility Procedures CPT4 Code: 78295621 Description: 99213 - WOUND CARE VISIT-LEV 3 EST PT Modifier: Quantity: 1 Physician Procedures CPT4 Code: 3086578 Description: 99214 - WC PHYS LEVEL 4 - EST PT Modifier: Quantity: 1 CPT4 Code: Description: ICD-10 Diagnosis Description L89.153 Pressure ulcer of sacral region, stage 3 I10 Essential (primary) hypertension F01.50 Vascular dementia without behavioral disturbance Modifier: Quantity: Electronic Signature(s) Signed: 10/20/2019 4:58:58 PM By: Lenda Kelp PA-C Entered By: Lenda Kelp on 10/20/2019 16:58:57

## 2019-10-28 NOTE — Progress Notes (Signed)
TENAE, [name] ([id number]) Visit Report for 09/01/2019 Arrival Information Details Patient Name: [name] [name], [name] Date of Service: 09/01/2019 8:45 AM Medical Record Number: [id number] Patient Account Number: [id number] Date of Birth/Sex: 24-Feb-1929 (86 y.o. F) Treating RN: Huel Coventry Primary Care Jaryah Aracena: Dewaine Oats Other Clinician: Referring Monice Lundy: Dewaine Oats Treating Charvis Lightner/Extender: Linwood Dibbles, HOYT Weeks in Treatment: 26 Visit Information History Since Last Visit Pain Present Now: No Patient Arrived: Wheel Chair Arrival Time: 08:42 Accompanied By: son Transfer Assistance: Nurse, adult Patient Identification Verified: Yes Secondary Verification Process Completed: Yes Electronic Signature(s) Signed: 10/28/2019 12:59:55 PM By: Elliot Gurney, BSN, RN, CWS, Kim RN, BSN Entered By: Elliot Gurney, BSN, RN, CWS, Kim on 09/01/2019 08:43:23 [name] ([id number]) -------------------------------------------------------------------------------- Clinic Level of Care Assessment Details Patient Name: [name] Lye A. Date of Service: 09/01/2019 8:45 AM Medical Record Number: [id number] Patient Account Number: [id number] Date of Birth/Sex: [birth year] (86 y.o. F) Treating RN: Rodell Perna Primary Care Rudolph Daoust: Dewaine Oats Other Clinician: Referring Shamanda Len: Dewaine Oats Treating Tipton Ballow/Extender: Linwood Dibbles, HOYT Weeks in Treatment: 26 Clinic Level of Care Assessment Items TOOL 4 Quantity Score []  - Use when only an EandM is performed on FOLLOW-UP visit 0 ASSESSMENTS - Nursing Assessment / Reassessment X - Reassessment of Co-morbidities (includes updates in patient status) 1 10 X- 1 5 Reassessment of Adherence to Treatment Plan ASSESSMENTS - Wound and Skin Assessment / Reassessment X - Simple Wound Assessment / Reassessment - one wound 1 5 []  - 0 Complex Wound Assessment / Reassessment - multiple wounds []  - 0 Dermatologic / Skin Assessment (not related to wound area) ASSESSMENTS - Focused  Assessment []  - Circumferential Edema Measurements - multi extremities 0 []  - 0 Nutritional Assessment / Counseling / Intervention []  - 0 Lower Extremity Assessment (monofilament, tuning fork, pulses) []  - 0 Peripheral Arterial Disease Assessment (using hand held doppler) ASSESSMENTS - Ostomy and/or Continence Assessment and Care []  - Incontinence Assessment and Management 0 []  - 0 Ostomy Care Assessment and Management (repouching, etc.) PROCESS - Coordination of Care X - Simple Patient / Family Education for ongoing care 1 15 []  - 0 Complex (extensive) Patient / Family Education for ongoing care []  - 0 Staff obtains , Records, Test Results / Process Orders []  - 0 Staff telephones HHA, Nursing Homes / Clarify orders / etc []  - 0 Routine Transfer to another Facility (non-emergent condition) []  - 0 Routine Hospital Admission (non-emergent condition) []  - 0 New Admissions / / Ordering NPWT, Apligraf, etc. []  - 0 Emergency Hospital Admission (emergent condition) X- 1 10 Simple Discharge Coordination []  - 0 Complex (extensive) Discharge Coordination PROCESS - Special Needs []  - Pediatric / Minor Patient Management 0 []  - 0 Isolation Patient Management []  - 0 Hearing / Language / Visual special needs []  - 0 Assessment of Community assistance (transportation, D/C planning, etc.) []  - 0 Additional assistance / Altered mentation []  - 0 Support Surface(s) Assessment (bed, cushion, seat, etc.) INTERVENTIONS - Wound Cleansing / Measurement Kawai, Nylia A. ( ) X- 1 5 Simple Wound Cleansing - one wound []  - 0 Complex Wound Cleansing - multiple wounds X- 1 5 Wound Imaging (photographs - any number of wounds) []  - 0 Wound Tracing (instead of photographs) X- 1 5 Simple Wound Measurement - one wound []  - 0 Complex Wound Measurement - multiple wounds INTERVENTIONS - Wound Dressings []  - Small Wound Dressing one or multiple wounds 0 X- 1  15 Medium Wound Dressing one or multiple wounds []  - 0 Large Wound Dressing  one or multiple wounds []  - 0 Application of Medications - topical []  - 0 Application of Medications - injection INTERVENTIONS - Miscellaneous []  - External ear exam 0 []  - 0 Specimen Collection (cultures, biopsies, blood, body fluids, etc.) []  - 0 Specimen(s) / Culture(s) sent or taken to Lab for analysis []  - 0 Patient Transfer (multiple staff / / Similar devices) []  - 0 Simple Staple / Suture removal (25 or less) []  - 0 Complex Staple / Suture removal (26 or more) []  - 0 Hypo / Hyperglycemic Management (close monitor of Blood Glucose) []  - 0 Ankle / Brachial Index (ABI) - do not check if billed separately X- 1 5 Vital Signs Has the patient been seen at the hospital within the last three years: Yes Total Score: 80 Level Of Care: New/Established - Level 3 Electronic Signature(s) Signed: 09/01/2019 9:44:09 AM By: Entered By: on 09/01/2019 09:11:51 Begnaud, (Nurse, adult) -------------------------------------------------------------------------------- Encounter Discharge Information Details Patient Name: A. Date of Service: 09/01/2019 8:45 AM Medical Record Number: Patient Account Number: Date of Birth/Sex: 11/21/1928 (86 y.o. F) Treating RN: Rodell Perna Primary Care [name] [name]: 09/03/2019 Other Clinician: Referring [name] [name]: [name] Treating [name]/Extender: [id number], HOYT Weeks in Treatment: 26 Encounter Discharge Information Items Discharge Condition: Stable Ambulatory Status: Wheelchair Discharge Destination: Skilled Nursing Facility Telephoned: No Orders Sent: Yes Transportation: Private Auto Accompanied By: family Schedule Follow-up Appointment: Yes Clinical Summary of Care: Electronic Signature(s) Signed: 09/01/2019 9:44:09 AM By: 09/03/2019 Entered By: [id number] on 09/01/2019 09:12:31 10/21/1928 A.  (06-30-2000) -------------------------------------------------------------------------------- Lower Extremity Assessment Details Patient Name: Rodell Perna A. Date of Service: 09/01/2019 8:45 AM Medical Record Number: Dewaine Oats Patient Account Number: Linwood Dibbles Date of Birth/Sex: January 05, 1929 (86 y.o. F) Treating RN: Rodell Perna Primary Care Malorie Bigford: 09/03/2019 Other Clinician: Referring Maghen Group: [name] Lye Treating Lillyth Spong/Extender: [id number] Weeks in Treatment: 26 Electronic Signature(s) Signed: 10/28/2019 12:59:55 PM By: 09/03/2019, BSN, RN, CWS, Kim RN, BSN Entered By: [id number], BSN, RN, CWS, Kim on 09/01/2019 08:51:33 [birth year] (Huel Coventry) -------------------------------------------------------------------------------- Multi Wound Chart Details Patient Name: Dewaine Oats A. Date of Service: 09/01/2019 8:45 AM Medical Record Number: Lenda Kelp Patient Account Number: 12/28/2019 Date of Birth/Sex: [birth year] (86 y.o. F) Treating RN: 09/03/2019 Primary Care Skylan Lara: [name] Other Clinician: Referring Rossi Silvestro: TATE, [name] Treating Masayoshi Couzens/Extender: STONE III, HOYT Weeks in Treatment: 26 Vital Signs Height(in): 67 Pulse(bpm): 81 Weight(lbs): 140 Blood Pressure(mmHg): 153/76 Body Mass Index(BMI): 22 Temperature(F): 98.0 Respiratory Rate(breaths/min): 16 Photos: [N/A:N/A] Wound Location: Midline Sacrum N/A N/A Wounding Event: Pressure Injury N/A N/A Primary Etiology: Pressure Ulcer N/A N/A Comorbid History: Glaucoma, Hypertension, N/A N/A Osteoarthritis, Dementia Date Acquired: 07/18/2018 N/A N/A Weeks of Treatment: 26 N/A N/A Wound Status: Open N/A N/A Clustered Wound: Yes N/A N/A Clustered Quantity: 1 N/A N/A Measurements L x W x D (cm) 3.5x2.9x2.3 N/A N/A Area (cm) : 7.972 N/A N/A Volume (cm) : 18.335 N/A N/A % Reduction in Area: 53.90% N/A N/A % Reduction in Volume: -961.10% N/A N/A Starting Position 1 (o'clock): 6 Ending Position 1 (o'clock): 12 Maximum  Distance 1 (cm): 2.1 Undermining: Yes N/A N/A Classification: Category/Stage IV N/A N/A Exudate Amount: Medium N/A N/A Exudate Type: Serous N/A N/A Exudate Color: amber N/A N/A Wound Margin: Indistinct, nonvisible N/A N/A Granulation Amount: Large (67-100%) N/A N/A Granulation Quality: Red N/A N/A Necrotic Amount: Small (1-33%) N/A N/A Exposed Structures: Fat Layer (Subcutaneous Tissue) N/A N/A Exposed: Yes Fascia: No Tendon: No Muscle:  No Joint: No Bone: No Epithelialization: Medium (34-66%) N/A N/A Treatment Notes Electronic Signature(s) Signed: 09/01/2019 9:44:09 AM By: Will Bonnet, Patrycja A. ([id number]) Entered By: Rodell Perna on 09/01/2019 09:10:57 [name] ([id number]) -------------------------------------------------------------------------------- Multi-Disciplinary Care Plan Details Patient Name: [name] Lye A. Date of Service: 09/01/2019 8:45 AM Medical Record Number: [id number] Patient Account Number: [id number] Date of Birth/Sex: [birth year] (86 y.o. F) Treating RN: Rodell Perna Primary Care Coriana Angello: Dewaine Oats Other Clinician: Referring Waverley Krempasky: Dewaine Oats Treating Tramane Gorum/Extender: Linwood Dibbles, HOYT Weeks in Treatment: 26 Active Inactive Abuse / Safety / Falls / Self Care Management Nursing Diagnoses: Potential for falls Goals: Patient will remain injury free related to falls Date Initiated: 03/03/2019 Target Resolution Date: 03/16/2019 Goal Status: Active Interventions: Assess fall risk on admission and as needed Notes: Medication Nursing Diagnoses: Knowledge deficit related to medication safety: actual or potential Goals: Patient/caregiver will demonstrate understanding of all current medications Date Initiated: 03/03/2019 Target Resolution Date: 03/16/2019 Goal Status: Active Interventions: Assess for medication contraindications each visit where new medications are prescribed Treatment Activities: New medication prescribed at Wound  Center : 03/03/2019 Notes: Necrotic Tissue Nursing Diagnoses: Impaired tissue integrity related to necrotic/devitalized tissue Goals: Necrotic/devitalized tissue will be minimized in the wound bed Date Initiated: 03/03/2019 Target Resolution Date: 03/16/2019 Goal Status: Active Interventions: Assess patient pain level pre-, during and post procedure and prior to discharge Treatment Activities: Apply topical anesthetic as ordered : 03/03/2019 Notes: Nutrition Nursing Diagnoses: Potential for alteratiion in Nutrition/Potential for imbalanced nutrition Ryce, Shevonne A. ([id number]) Goals: Patient/caregiver agrees to and verbalizes understanding of need to use nutritional supplements and/or vitamins as prescribed Date Initiated: 03/03/2019 Target Resolution Date: 03/16/2019 Goal Status: Active Interventions: Provide education on nutrition Notes: Pressure Nursing Diagnoses: Knowledge deficit related to management of pressures ulcers Goals: Patient/caregiver will verbalize understanding of pressure ulcer management Date Initiated: 03/03/2019 Target Resolution Date: 03/16/2019 Goal Status: Active Interventions: Assess: immobility, friction, shearing, incontinence upon admission and as needed Provide education on pressure ulcers Treatment Activities: Patient referred for home evaluation of offloading devices/mattresses : 03/03/2019 Patient referred for pressure reduction/relief devices : 03/03/2019 Pressure reduction/relief device ordered : 03/03/2019 Notes: Wound/Skin Impairment Nursing Diagnoses: Impaired tissue integrity Goals: Ulcer/skin breakdown will have a volume reduction of 30% by week 4 Date Initiated: 03/03/2019 Target Resolution Date: 03/03/2019 Goal Status: Active Interventions: Assess ulceration(s) every visit Treatment Activities: Skin care regimen initiated : 03/03/2019 Notes: Electronic Signature(s) Signed: 09/01/2019 9:44:09 AM By: Rodell Perna Entered  By: Rodell Perna on 09/01/2019 09:10:50 Fredrick, [name] ([id number]) -------------------------------------------------------------------------------- Pain Assessment Details Patient Name: [name] Lye A. Date of Service: 09/01/2019 8:45 AM Medical Record Number: [id number] Patient Account Number: [id number] Date of Birth/Sex: Jun 17, 1928 (86 y.o. F) Treating RN: Huel Coventry Primary Care Monik Lins: Dewaine Oats Other Clinician: Referring Shaunita Seney: Dewaine Oats Treating Senica Crall/Extender: Linwood Dibbles, HOYT Weeks in Treatment: 26 Active Problems Location of Pain Severity and Description of Pain Patient Has Paino Patient Unable to Respond Site Locations Pain Management and Medication Current Pain Management: Electronic Signature(s) Signed: 10/28/2019 12:59:55 PM By: Elliot Gurney, BSN, RN, CWS, Kim RN, BSN Entered By: Elliot Gurney, BSN, RN, CWS, Kim on 09/01/2019 08:51:23 [name] ([id number]) -------------------------------------------------------------------------------- Patient/Caregiver Education Details Patient Name: [name] Lye A. Date of Service: 09/01/2019 8:45 AM Medical Record Number: [id number] Patient Account Number: [id number] Date of Birth/Gender: 1928-12-30 (86 y.o. F) Treating RN: Rodell Perna Primary Care Physician: Dewaine Oats Other Clinician: Referring Physician: Dewaine Oats Treating Physician/Extender: Skeet Simmer in Treatment: 26 Education Assessment Education Provided To: Patient  Education Topics Provided Wound/Skin Impairment: Handouts: Caring for Your Ulcer Methods: Demonstration, Explain/Verbal Responses: State content correctly Electronic Signature(s) Signed: 09/01/2019 9:44:09 AM By: Army Melia Entered By: Army Melia on 09/01/2019 09:12:03 [name] ([id number]) -------------------------------------------------------------------------------- Wound Assessment Details Patient Name: [name] Date of Service: 09/01/2019 8:45 AM Medical Record Number:  [id number] Patient Account Number: [id number] Date of Birth/Sex: January 25, 1929 (86 y.o. F) Treating RN: Cornell Barman Primary Care Jeanenne Licea: Benita Stabile Other Clinician: Referring Tashiana Lamarca: Benita Stabile Treating Larya Charpentier/Extender: Melburn Hake, HOYT Weeks in Treatment: 26 Wound Status Wound Number: 4 Primary Etiology: Pressure Ulcer Wound Location: Midline Sacrum Wound Status: Open Wounding Event: Pressure Injury Comorbid History: Glaucoma, Hypertension, Osteoarthritis, Dementia Date Acquired: 07/18/2018 Weeks Of Treatment: 26 Clustered Wound: Yes Photos Wound Measurements Length: (cm) 3.5 Width: (cm) 2.9 Depth: (cm) 2.3 Clustered Quantity: 1 Area: (cm) 7.972 Volume: (cm) 18.335 % Reduction in Area: 53.9% % Reduction in Volume: -961.1% Epithelialization: Medium (34-66%) Tunneling: No Undermining: Yes Starting Position (o'clock): 6 Ending Position (o'clock): 12 Maximum Distance: (cm) 2.1 Wound Description Classification: Category/Stage IV Wound Margin: Indistinct, nonvisible Exudate Amount: Medium Exudate Type: Serous Exudate Color: amber Foul Odor After Cleansing: No Slough/Fibrino Yes Wound Bed Granulation Amount: Large (67-100%) Exposed Structure Granulation Quality: Red Fascia Exposed: No Necrotic Amount: Small (1-33%) Fat Layer (Subcutaneous Tissue) Exposed: Yes Necrotic Quality: Adherent Slough Tendon Exposed: No Muscle Exposed: No Joint Exposed: No Bone Exposed: No Electronic Signature(s) Signed: 10/28/2019 12:59:55 PM By: Gretta Cool, BSN, RN, CWS, Kim RN, BSN Entered By: Gretta Cool, BSN, RN, CWS, Kim on 09/01/2019 08:59:59 [place] Crane, Aundra Dubin ([id number]) -------------------------------------------------------------------------------- Vitals Details Patient Name: [name] Date of Service: 09/01/2019 8:45 AM Medical Record Number: [id number] Patient Account Number: [id number] Date of Birth/Sex: [birth year] (86 y.o. F) Treating RN: Cornell Barman Primary Care Omega Durante: Benita Stabile  Other Clinician: Referring Eleena Grater: Benita Stabile Treating Bryne Lindon/Extender: Melburn Hake, HOYT Weeks in Treatment: 26 Vital Signs Time Taken: 08:43 Temperature (F): 98.0 Height (in): 67 Pulse (bpm): 81 Weight (lbs): 140 Respiratory Rate (breaths/min): 16 Body Mass Index (BMI): 21.9 Blood Pressure (mmHg): 153/76 Reference Range: 80 - 120 mg / dl Electronic Signature(s) Signed: 10/28/2019 12:59:55 PM By: Gretta Cool, BSN, RN, CWS, Kim RN, BSN Entered By: Gretta Cool, BSN, RN, CWS, Kim on 09/01/2019 08:51:10

## 2019-11-03 ENCOUNTER — Encounter: Payer: Medicare Other | Admitting: Physician Assistant

## 2019-11-03 ENCOUNTER — Other Ambulatory Visit: Payer: Self-pay

## 2019-11-03 DIAGNOSIS — L89153 Pressure ulcer of sacral region, stage 3: Secondary | ICD-10-CM | POA: Diagnosis not present

## 2019-11-03 NOTE — Progress Notes (Addendum)
IZA, PRESTON ([id number]) Visit Report for 11/03/2019 Arrival Information Details Patient Name: [name] [name], [name] [name]. Date of Service: 11/03/2019 9:15 AM Medical Record Number: [id number] Patient Account Number: [id number] Date of Birth/Sex: September 14, 1928 (86 y.o. F) Treating RN: Rodell Perna Primary Care Albion Weatherholtz: Dewaine Oats Other Clinician: Referring Katerra Ingman: Dewaine Oats Treating Franshesca Chipman/Extender: Linwood Dibbles, HOYT Weeks in Treatment: 35 Visit Information History Since Last Visit Added or deleted any medications: No Patient Arrived: Wheel Chair Any new allergies or adverse reactions: No Arrival Time: 09:24 Had [name] fall or experienced change in No Accompanied By: self activities of daily living that may affect Transfer Assistance: Michiel Sites Lift risk of falls: Patient Identification Verified: Yes Signs or symptoms of abuse/neglect since last visito No Secondary Verification Process Completed: Yes Hospitalized since last visit: No Implantable device outside of the clinic excluding No cellular tissue based products placed in the center since last visit: Has Dressing in Place as Prescribed: Yes Pain Present Now: No Electronic Signature(s) Signed: 11/03/2019 3:30:19 PM By: Dayton Martes RCP, RRT, CHT Entered By: Dayton Martes on 11/03/2019 09:25:20 [name] ([id number]) -------------------------------------------------------------------------------- Clinic Level of Care Assessment Details Patient Name: [name] [name]. Date of Service: 11/03/2019 9:15 AM Medical Record Number: [id number] Patient Account Number: [id number] Date of Birth/Sex: 1928-07-31 (86 y.o. F) Treating RN: Rodell Perna Primary Care Raynor Calcaterra: Dewaine Oats Other Clinician: Referring Buster Schueller: Dewaine Oats Treating Alisha Bacus/Extender: Linwood Dibbles, HOYT Weeks in Treatment: 35 Clinic Level of Care Assessment Items TOOL 4 Quantity Score []  - Use when only an EandM is performed on FOLLOW-UP visit  0 ASSESSMENTS - Nursing Assessment / Reassessment X - Reassessment of Co-morbidities (includes updates in patient status) 1 10 X- 1 5 Reassessment of Adherence to Treatment Plan ASSESSMENTS - Wound and Skin Assessment / Reassessment X - Simple Wound Assessment / Reassessment - one wound 1 5 []  - 0 Complex Wound Assessment / Reassessment - multiple wounds []  - 0 Dermatologic / Skin Assessment (not related to wound area) ASSESSMENTS - Focused Assessment []  - Circumferential Edema Measurements - multi extremities 0 []  - 0 Nutritional Assessment / Counseling / Intervention []  - 0 Lower Extremity Assessment (monofilament, tuning fork, pulses) []  - 0 Peripheral Arterial Disease Assessment (using hand held doppler) ASSESSMENTS - Ostomy and/or Continence Assessment and Care []  - Incontinence Assessment and Management 0 []  - 0 Ostomy Care Assessment and Management (repouching, etc.) PROCESS - Coordination of Care X - Simple Patient / Family Education for ongoing care 1 15 []  - 0 Complex (extensive) Patient / Family Education for ongoing care []  - 0 Staff obtains , Records, Test Results / Process Orders X- 1 10 Staff telephones HHA, Nursing Homes / Clarify orders / etc []  - 0 Routine Transfer to another Facility (non-emergent condition) []  - 0 Routine Hospital Admission (non-emergent condition) []  - 0 New Admissions / / Ordering NPWT, Apligraf, etc. []  - 0 Emergency Hospital Admission (emergent condition) X- 1 10 Simple Discharge Coordination []  - 0 Complex (extensive) Discharge Coordination PROCESS - Special Needs []  - Pediatric / Minor Patient Management 0 []  - 0 Isolation Patient Management []  - 0 Hearing / Language / Visual special needs []  - 0 Assessment of Community assistance (transportation, D/C planning, etc.) []  - 0 Additional assistance / Altered mentation []  - 0 Support Surface(s) Assessment (bed, cushion, seat,  etc.) INTERVENTIONS - Wound Cleansing / Measurement [name] [name], [name] [name]. ( ) X- 1 5 Simple Wound Cleansing - one wound []  - 0 Complex Wound Cleansing - multiple  wounds X- 1 5 Wound Imaging (photographs - any number of wounds) []  - 0 Wound Tracing (instead of photographs) X- 1 5 Simple Wound Measurement - one wound []  - 0 Complex Wound Measurement - multiple wounds INTERVENTIONS - Wound Dressings []  - Small Wound Dressing one or multiple wounds 0 X- 1 15 Medium Wound Dressing one or multiple wounds []  - 0 Large Wound Dressing one or multiple wounds []  - 0 Application of Medications - topical []  - 0 Application of Medications - injection INTERVENTIONS - Miscellaneous []  - External ear exam 0 []  - 0 Specimen Collection (cultures, biopsies, blood, body fluids, etc.) []  - 0 Specimen(s) / Culture(s) sent or taken to Lab for analysis []  - 0 Patient Transfer (multiple staff / / Similar devices) []  - 0 Simple Staple / Suture removal (25 or less) []  - 0 Complex Staple / Suture removal (26 or more) []  - 0 Hypo / Hyperglycemic Management (close monitor of Blood Glucose) []  - 0 Ankle / Brachial Index (ABI) - do not check if billed separately X- 1 5 Vital Signs Has the patient been seen at the hospital within the last three years: Yes Total Score: 90 Level Of Care: New/Established - Level 3 Electronic Signature(s) Signed: 11/03/2019 1:58:43 PM By: Entered By: on 11/03/2019 09:51:33 [name] [name], [name]. ( ) -------------------------------------------------------------------------------- Encounter Discharge Information Details Patient Name: [name]. Date of Service: 11/03/2019 9:15 AM Medical Record Number: Patient Account Number: Nurse, adult Date of Birth/Sex: 08/27/1928 (86 y.o. F) Treating RN: Primary Care Cassie Henkels: Other Clinician: Referring Mckenzie Toruno: 11/05/2019 Treating Eliazer Hemphill/Extender:  Rodell Perna, HOYT Weeks in Treatment: 35 Encounter Discharge Information Items Discharge Condition: Stable Ambulatory Status: Wheelchair Discharge Destination: Skilled Nursing Facility Telephoned: No Orders Sent: Yes Transportation: Private Auto Accompanied By: self Schedule Follow-up Appointment: Yes Clinical Summary of Care: Electronic Signature(s) Signed: 11/03/2019 1:58:43 PM By: 11/05/2019 Entered By: Marylouise Stacks on 11/03/2019 09:52:36 [name] [name], [name] [name]. (11/05/2019) -------------------------------------------------------------------------------- Lower Extremity Assessment Details Patient Name: [id number] [name]. Date of Service: 11/03/2019 9:15 AM Medical Record Number: 10/21/1928 Patient Account Number: [id number] Date of Birth/Sex: 10-Mar-1929 (86 y.o. F) Treating RN: Dewaine Oats Primary Care Gailene Youkhana: Linwood Dibbles Other Clinician: Referring Gifford Ballon: 11/05/2019 Treating Filomeno Cromley/Extender: Rodell Perna Weeks in Treatment: 35 Electronic Signature(s) Signed: 11/04/2019 11:02:18 AM By: 11/05/2019, BSN, RN, CWS, Kim RN, BSN Entered By: Marylouise Stacks, BSN, RN, CWS, Kim on 11/03/2019 09:41:47 [name] Lye, 11/05/2019 ([id number]) -------------------------------------------------------------------------------- Multi Wound Chart Details Patient Name: [id number] [name]. Date of Service: 11/03/2019 9:15 AM Medical Record Number: 06-30-2002 Patient Account Number: Huel Coventry Date of Birth/Sex: [birth year] (86 y.o. F) Treating RN: Lenda Kelp Primary Care Winter Trefz: 11/06/2019 Other Clinician: Referring Maelyn Berrey: TATE, Elliot Gurney Treating Tobby Fawcett/Extender: STONE III, HOYT Weeks in Treatment: 35 Vital Signs Height(in): 67 Pulse(bpm): 86 Weight(lbs): 140 Blood Pressure(mmHg): 118/65 Body Mass Index(BMI): 22 Temperature(F): 97.7 Respiratory Rate(breaths/min): 16 Photos: [N/[name]:N/[name]] Wound Location: Midline Sacrum N/[name] N/[name] Wounding Event: Pressure Injury N/[name] N/[name] Primary Etiology: Pressure Ulcer N/[name] N/[name] Comorbid History:  Glaucoma, Hypertension, N/[name] N/[name] Osteoarthritis, Dementia Date Acquired: 07/18/2018 N/[name] N/[name] Weeks of Treatment: 35 N/[name] N/[name] Wound Status: Open N/[name] N/[name] Clustered Wound: Yes N/[name] N/[name] Clustered Quantity: 1 N/[name] N/[name] Measurements L x W x D (cm) 3.5x2.5x1.5 N/[name] N/[name] Area (cm) : 11/05/2019 N/[name] N/[name] Volume (cm) : 10.308 N/[name] N/[name] % Reduction in Area: 60.20% N/[name] N/[name] % Reduction in Volume: -496.50% N/[name] N/[name] Starting Position 1 (o'clock): 12 Ending Position 1 (o'clock):  6 Maximum Distance 1 (cm): 1.2 Undermining: Yes N/[name] N/[name] Classification: Category/Stage IV N/[name] N/[name] Exudate Amount: Medium N/[name] N/[name] Exudate Type: Serous N/[name] N/[name] Exudate Color: amber N/[name] N/[name] Wound Margin: Indistinct, nonvisible N/[name] N/[name] Granulation Amount: Large (67-100%) N/[name] N/[name] Granulation Quality: Red N/[name] N/[name] Necrotic Amount: Small (1-33%) N/[name] N/[name] Exposed Structures: Fat Layer (Subcutaneous Tissue) N/[name] N/[name] Exposed: Yes Fascia: No Tendon: No Muscle: No Joint: No Bone: No Epithelialization: Small (1-33%) N/[name] N/[name] Treatment Notes Electronic Signature(s) Signed: 11/03/2019 1:58:43 PM By: Sena Hitch, [place] [name]. ([id number]) Entered By: Army Melia on 11/03/2019 09:50:10 Susa Griffins ([id number]) -------------------------------------------------------------------------------- Multi-Disciplinary Care Plan Details Patient Name: [name] [name]. Date of Service: 11/03/2019 9:15 AM Medical Record Number: [id number] Patient Account Number: [id number] Date of Birth/Sex: 28-Aug-1928 (86 y.o. F) Treating RN: Army Melia Primary Care Jakyra Kenealy: Benita Stabile Other Clinician: Referring Aloma Boch: Benita Stabile Treating Asalee Barrette/Extender: Melburn Hake, HOYT Weeks in Treatment: 69 Active Inactive Abuse / Safety / Falls / Self Care Management Nursing Diagnoses: Potential for falls Goals: Patient will remain injury free related to falls Date Initiated: 03/03/2019 Target Resolution Date: 03/16/2019 Goal Status: Active Interventions: Assess fall risk  on admission and as needed Notes: Medication Nursing Diagnoses: Knowledge deficit related to medication safety: actual or potential Goals: Patient/caregiver will demonstrate understanding of all current medications Date Initiated: 03/03/2019 Target Resolution Date: 03/16/2019 Goal Status: Active Interventions: Assess for medication contraindications each visit where new medications are prescribed Treatment Activities: New medication prescribed at [place] : 03/03/2019 Notes: Necrotic Tissue Nursing Diagnoses: Impaired tissue integrity related to necrotic/devitalized tissue Goals: Necrotic/devitalized tissue will be minimized in the wound bed Date Initiated: 03/03/2019 Target Resolution Date: 03/16/2019 Goal Status: Active Interventions: Assess patient pain level pre-, during and post procedure and prior to discharge Treatment Activities: Apply topical anesthetic as ordered : 03/03/2019 Notes: Nutrition Nursing Diagnoses: Potential for alteratiion in Nutrition/Potential for imbalanced nutrition Bauder, Nancylee [name]. ([id number]) Goals: Patient/caregiver agrees to and verbalizes understanding of need to use nutritional supplements and/or vitamins as prescribed Date Initiated: 03/03/2019 Target Resolution Date: 03/16/2019 Goal Status: Active Interventions: Provide education on nutrition Notes: Pressure Nursing Diagnoses: Knowledge deficit related to management of pressures ulcers Goals: Patient/caregiver will verbalize understanding of pressure ulcer management Date Initiated: 03/03/2019 Target Resolution Date: 03/16/2019 Goal Status: Active Interventions: Assess: immobility, friction, shearing, incontinence upon admission and as needed Provide education on pressure ulcers Treatment Activities: Patient referred for home evaluation of offloading devices/mattresses : 03/03/2019 Patient referred for pressure reduction/relief devices : 03/03/2019 Pressure reduction/relief  device ordered : 03/03/2019 Notes: Wound/Skin Impairment Nursing Diagnoses: Impaired tissue integrity Goals: Ulcer/skin breakdown will have [name] volume reduction of 30% by week 4 Date Initiated: 03/03/2019 Target Resolution Date: 03/03/2019 Goal Status: Active Interventions: Assess ulceration(s) every visit Treatment Activities: Skin care regimen initiated : 03/03/2019 Notes: Electronic Signature(s) Signed: 11/03/2019 1:58:43 PM By: Army Melia Entered By: Army Melia on 11/03/2019 09:50:00 [name] [name], [name] Capers [name]. ([id number]) -------------------------------------------------------------------------------- Pain Assessment Details Patient Name: [name] [name]. Date of Service: 11/03/2019 9:15 AM Medical Record Number: [id number] Patient Account Number: [id number] Date of Birth/Sex: Jan 07, 1929 (86 y.o. F) Treating RN: Cornell Barman Primary Care Aaden Buckman: Benita Stabile Other Clinician: Referring Jiya Kissinger: Benita Stabile Treating Hobson Lax/Extender: Melburn Hake, HOYT Weeks in Treatment: 35 Active Problems Location of Pain Severity and Description of Pain Patient Has Paino No Site Locations Pain Management and Medication Current Pain Management: Notes Patient denies pain at this time. Electronic Signature(s) Signed: 11/04/2019 11:02:18 AM By: Gretta Cool, BSN, RN, CWS, Kim RN, BSN Entered By:  Elliot Gurney, BSN, RN, CWS, Kim on 11/03/2019 09:36:01 [name] [name], [name] [name] ([id number]) -------------------------------------------------------------------------------- Patient/Caregiver Education Details Patient Name: [name] [name], [name] [name]. Date of Service: 11/03/2019 9:15 AM Medical Record Number: [id number] Patient Account Number: [id number] Date of Birth/Gender: 21-Oct-1928 (86 y.o. F) Treating RN: Rodell Perna Primary Care Physician: Dewaine Oats Other Clinician: Referring Physician: Dewaine Oats Treating Physician/Extender: Skeet Simmer in Treatment: 35 Education Assessment Education Provided To: Patient Education Topics  Provided Wound/Skin Impairment: Handouts: Caring for Your Ulcer Methods: Demonstration, Explain/Verbal Responses: State content correctly Electronic Signature(s) Signed: 11/03/2019 1:58:43 PM By: Rodell Perna Entered By: Rodell Perna on 11/03/2019 09:51:48 [name] [name], [name] [name]. ([id number]) -------------------------------------------------------------------------------- Wound Assessment Details Patient Name: [name] [name]. Date of Service: 11/03/2019 9:15 AM Medical Record Number: [id number] Patient Account Number: [id number] Date of Birth/Sex: 1928/12/22 (86 y.o. F) Treating RN: Huel Coventry Primary Care Hays Dunnigan: Dewaine Oats Other Clinician: Referring Urban Naval: Dewaine Oats Treating Rosaline Ezekiel/Extender: STONE III, HOYT Weeks in Treatment: 35 Wound Status Wound Number: 4 Primary Etiology: Pressure Ulcer Wound Location: Midline Sacrum Wound Status: Open Wounding Event: Pressure Injury Comorbid History: Glaucoma, Hypertension, Osteoarthritis, Dementia Date Acquired: 07/18/2018 Weeks Of Treatment: 35 Clustered Wound: Yes Photos Wound Measurements Length: (cm) 3.5 % Width: (cm) 2.5 % Depth: (cm) 1.5 Ep Clustered Quantity: 1 Tu Area: (cm) 6.872 U Volume: (cm) 10.308 Reduction in Area: 60.2% Reduction in Volume: -496.5% ithelialization: Small (1-33%) nneling: No ndermining: Yes Starting Position (o'clock): 12 Ending Position (o'clock): 6 Maximum Distance: (cm) 1.2 Wound Description Classification: Category/Stage IV F Wound Margin: Indistinct, nonvisible S Exudate Amount: Medium Exudate Type: Serous Exudate Color: amber oul Odor After Cleansing: No lough/Fibrino Yes Wound Bed Granulation Amount: Large (67-100%) Exposed Structure Granulation Quality: Red Fascia Exposed: No Necrotic Amount: Small (1-33%) Fat Layer (Subcutaneous Tissue) Exposed: Yes Necrotic Quality: Adherent Slough Tendon Exposed: No Muscle Exposed: No Joint Exposed: No Bone Exposed: No Treatment Notes Wound  #4 (Midline Sacrum) [name] [name], [name] [name]. ([id number]) Notes silver ag, dry gauze, ABD and tape to midline sacrum Electronic Signature(s) Signed: 11/04/2019 11:02:18 AM By: Elliot Gurney, BSN, RN, CWS, Kim RN, BSN Entered By: Elliot Gurney, BSN, RN, CWS, Kim on 11/03/2019 09:36:58 [name] Aye, [name] [name] ([id number]) -------------------------------------------------------------------------------- Vitals Details Patient Name: [name] [name]. Date of Service: 11/03/2019 9:15 AM Medical Record Number: [id number] Patient Account Number: [id number] Date of Birth/Sex: 25-Jun-1928 (86 y.o. F) Treating RN: Rodell Perna Primary Care Marvelous Bouwens: Dewaine Oats Other Clinician: Referring Mykaila Blunck: TATE, Katherina Right Treating Yacine Garriga/Extender: STONE III, HOYT Weeks in Treatment: 35 Vital Signs Time Taken: 09:25 Temperature (F): 97.7 Height (in): 67 Pulse (bpm): 86 Weight (lbs): 140 Respiratory Rate (breaths/min): 16 Body Mass Index (BMI): 21.9 Blood Pressure (mmHg): 118/65 Reference Range: 80 - 120 mg / dl Electronic Signature(s) Signed: 11/03/2019 3:30:19 PM By: Dayton Martes RCP, RRT, CHT Entered By: Weyman Rodney, Lucio Edward on 11/03/2019 09:25:48

## 2019-11-03 NOTE — Progress Notes (Addendum)
YOLETTE, HASTINGS ([id number]) Visit Report for 11/03/2019 Chief Complaint Document Details Patient Name: QUEENIE, AUFIERO A. Date of Service: 11/03/2019 9:15 AM Medical Record Number: [id number] Patient Account Number: [id number] Date of Birth/Sex: [birth year] (86 y.o. F) Treating RN: Rodell Perna Primary Care Provider: Dewaine Oats Other Clinician: Referring Provider: Dewaine Oats Treating Provider/Extender: Linwood Dibbles, Emylia Latella Weeks in Treatment: 35 Information Obtained from: Patient Chief Complaint Multiple pressure ulcers Electronic Signature(s) Signed: 11/03/2019 9:23:00 AM By: Lenda Kelp PA-C Entered By: Lenda Kelp on 11/03/2019 09:22:59 [name], [name] ([id number]) -------------------------------------------------------------------------------- HPI Details Patient Name: [name] Date of Service: 11/03/2019 9:15 AM Medical Record Number: [id number] Patient Account Number: [id number] Date of Birth/Sex: 20-Jan-1929 (86 y.o. F) Treating RN: Rodell Perna Primary Care Provider: Dewaine Oats Other Clinician: Referring Provider: Dewaine Oats Treating Provider/Extender: Linwood Dibbles, Zarai Orsborn Weeks in Treatment: 35 History of Present Illness HPI Description: 03/03/2019 on evaluation today patient presents for initial evaluation here in the clinic today concerning issues that she has been having with wounds which are pressure in nature at multiple locations currently. This is on her left heel, right ankle, left hip/ischial location, and all her to varying degrees of severity and depth. The worst is on the left ischial location. At this time the patient has been tolerating dressing changes although I am not exactly sure the dressings that have been utilized prior to coming in today. She does have some necrotic tissue noted at several locations this can require at least some degree of cleaning prior to application of dressings going forward. The patient does have dementia and unfortunately in recent months  has become increasingly immobile requiring more significant treatment she was just recently moved roughly 2 weeks ago from assisted living to a skilled nursing facility at [facility]. Based on what I am seeing currently these pressure injuries occurred prior to that 2-week time I do not see any new or obvious pressure injury at this point. I discussed with the patient as well as her family member that this does appear to be doing better in my opinion than likely where things started. They are unsure as to whether or not she has an air mattress at the facility I do think that would be something that would be appropriate and good for her to have but at the moment I do not know for sure whether that is already in place if not that definitely is can be 1 of the recommendations. Also think a Roho cushion for her wheelchair would be good as well as Prevalon offloading boots. The patient currently does not have any severe pain although when I was cleaning some of the areas she did note there was some discomfort at several of the locations. She does have a history of hypertension as well. 10/29; this is a patient from [facility] nursing home. She is nonambulatory. She is here for follow-up on 4 different wounds 1 over the left lateral malleolus, the right medial calcaneus, the left posterior hip and I believe a new area over the sacrum. The patient is nonambulatory but apparently eats well. 03/24/2019 on evaluation today patient actually appears to be doing better with regard to her wounds in general based on what I am seeing. Fortunately there is no signs of active infection at this time. No fever chills noted. Overall been very pleased with the progress that seems to be occurring since I last saw her. There is a little bit of debridement That will need to be performed  today. 04/07/2019 on evaluation today patient actually appears to be doing well in some regards based on what I am seeing today.  Fortunately there is no signs of active infection at this time which is good news. No fever chills noted. With that being said the wounds on her foot and ankle region in general have healed at this point. I do not see anything open in that region. With regard to the other 2 wounds in the left ischium and midline sacral region I believe that we may need to make a dressing change at this point to do something a little bit more conducive to cleaning up the wound bed. The patient is in agreement with that plan today. 04/21/2019 on evaluation today patient actually appears to be doing well with regard to her wounds all things considered. Both appear to be much cleaner than what they were previous. Fortunately there is no signs of active infection at this time. Both I think are doing well with the current wound care measures which is the Dakin moistened gauze dressings. 05/05/2019 on evaluation today patient seems to be doing much better in regard to her ischial ulcer unfortunately she still seems to be getting pressure to the sacral region. I think that this is evidenced by the fact that she does have some surrounding deep tissue injury and the fact that the wound is actually measuring larger not better. Fortunately there is no evidence of active infection at this time I do believe the Dakin's is still the best option treatment wise but again I do think the patient needs more appropriate offloading on the sacral region. 06/24/2019 on evaluation today patient appears to be doing well with regard to the wound over the ischium which appears to be completely healed. This is great news. Fortunately there does appear to be some improvement with regard to the sacral wound as well. There still does seem to be evidence of necrotic tissue noted minimally that I was able to gently clean away. With that being said the wound itself seems to be doing quite well which is good news. She seems to be offloaded appropriately in  my opinion at this point. I think she may be ready for a wound VAC. 07/14/2019 on evaluation today patient appears to be doing well with regard to her sacral wound in general although unfortunately she does appear to have some evidence of potential infection there is some odor that seems to be a little stronger than just what would normally be noted with the wound VAC. With that being said I do think we need to address this and though I think the wound VAC is appropriate to continue I think that we do need to see about a culture as well as placing her on some antibiotic therapy at this point. The patient's granddaughter was present during evaluation today as well. 07/28/2019 upon evaluation today patient unfortunately appears to not be doing quite as well as I would like with regard to the wound VAC and really was not applied appropriately. The phone was applied directly to the skin which did cause a little bit of breakdown around the opening of the wound. Subsequently is also unfortunately the case that the foam that was down at the sacral region and that was tracked up and along her back was not actually connected so they touched this obviously inhibits the ability to be able to properly drain fluid from 1 to the other the have to be continuous and touching. With  that being said there was some improvement today in the overall appearance of the wound bed though obviously size wise not much difference today. We are going to get in touch with the facility to try to make sure that they know what they are doing with applying the wound VAC I am also can likely see her for more frequent follow-up for the time being. All this was discussed with the patient's grandson during the office visit today as well. 08/11/19 upon evaluation today patient appears to be doing poorly in regard to her wound at this time. Unfortunately there appears to be infection at this time including blue-green drainage and a poor quality  to the granulation tissue all of which point toward infection she also seems to be much more painful than we noticed in the past. She is moving around a lot just with very light touch around the area I think that is an indication of discomfort 08/18/2019 upon evaluation today patient appears to be doing excellent in regard to her wound as compared to last evaluation. There is still some odor but she does not have as much of the necrotic tissue nor the significant drainage that I noted at the last visit. There still does appear to be evidence of infection and she still is much more tender than what she was experiencing prior to the infection noted last week. 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 I do believe that she likely is getting need to see infectious Leser, Leeya A. ([id number]) disease due to the ongoing nature of this infection. For now would you still have to hold off on reinitiating the wound VAC. 08/25/2019 upon evaluation today patient appears to be doing excellent with regard to the overall appearance of the wound though there is still is an odor and I do think that though the wound VAC would be best for her were not quite at the point of getting that on yet she actually has her appointment with infectious disease this afternoon it sounds like this will be with Dr. Sampson Goon. Fortunately there is no signs of systemic infection at this time. 09/01/2019 upon evaluation today patient appears to be doing excellent at this point in regard to her sacral wound. In fact I do not even notice any odor once everything was cleaned away and I came into the room to evaluate the patient the wound seems to be doing very well. There is excellent granulation, no necrotic tissue, and overall I think the Dakin's solution has done extremely well. I did talk with Dr. Sampson Goon who saw the patient in the interim earlier this week and of note her lab  work and everything else checked out just fine and Dr. Sampson Goon really was not concerned about the possibility of the need for IV antibiotic therapy. Obviously if anything worsens or changes in that may still be a consideration but right now she seems to actually be doing extremely well on all fronts. I think the Dakin's moistened gauze is actually a very good option for her to continue at this point. 09/15/2019 upon evaluation today patient appears to be doing well with regard to her wound in general. Fortunately there is no signs of significant infection overall I feel like she is doing excellent in that regard. With that being said I think the Dakin's moistened gauze packing has done extremely well for her 5/13; 2-week follow-up. The patient has a deep wound on  the lower sacral area however generally healthy looking surface over the bone here. There was no palpable bone no surrounding infection. I see that they have been using Dakin's moistened wet-to-dry. A wound VAC is apparently on hold. 10/20/2019 upon evaluation today patient's wound bed actually showed signs of good granulation and epithelization she seems to be doing excellent as far as the continuation of feeling of the wound is concerned there does not appear to be any significant slough or breakdown and no significant infection at this point. Overall I am extremely pleased with where things stand today. Her family member who is here today is very happy to hear this. 11/03/2019 upon evaluation today patient appears to be doing excellent at this point with regard to her wound in general although is not significantly smaller in size there appears to be a lot of maceration. I think the biggest issue here may be that she really needs to have more of a dressing to help control the drainage. I think possibly silver alginate with dry gauze and behind may be ideal for her. Electronic Signature(s) Signed: 11/03/2019 10:00:21 AM By: Lenda Kelp  PA-C Entered By: Lenda Kelp on 11/03/2019 10:00:20 [name] ([id number]) -------------------------------------------------------------------------------- Physical Exam Details Patient Name: [name] Date of Service: 11/03/2019 9:15 AM Medical Record Number: [id number] Patient Account Number: [id number] Date of Birth/Sex: [birth year] (86 y.o. F) Treating RN: Rodell Perna Primary Care Provider: Dewaine Oats Other Clinician: Referring Provider: TATE, Katherina Right Treating Provider/Extender: STONE III, Kendrix Orman Weeks in Treatment: 35 Constitutional Well-nourished and well-hydrated in no acute distress. Respiratory normal breathing without difficulty. Psychiatric Patient is not able to cooperate in decision making regarding care. Patient has dementia. pleasant and cooperative. Notes Patient's wound bed currently again showed signs of fairly good granulation there is no signs of active infection at this time which is great news overall I am very pleased with that. With that being said I think we may need to switch up her dressings a little bit here to help with this to proceed in a better way as far as healing is concerned. Electronic Signature(s) Signed: 11/03/2019 10:01:17 AM By: Lenda Kelp PA-C Entered By: Lenda Kelp on 11/03/2019 10:01:16 [name] ([id number]) -------------------------------------------------------------------------------- Physician Orders Details Patient Name: [name] Date of Service: 11/03/2019 9:15 AM Medical Record Number: [id number] Patient Account Number: [id number] Date of Birth/Sex: Feb 17, 1929 (86 y.o. F) Treating RN: Rodell Perna Primary Care Provider: Dewaine Oats Other Clinician: Referring Provider: TATE, Katherina Right Treating Provider/Extender: Linwood Dibbles, Cosandra Plouffe Weeks in Treatment: 35 Verbal / Phone Orders: No Diagnosis Coding ICD-10 Coding Code Description L89.153 Pressure ulcer of sacral region, stage 3 I10 Essential (primary)  hypertension F01.50 Vascular dementia without behavioral disturbance Wound Cleansing Wound #4 Midline Sacrum o Clean wound with Normal Saline. o Cleanse wound with mild soap and water Primary Wound Dressing Wound #4 Midline Sacrum o Silver Alginate Secondary Dressing Wound #4 Midline Sacrum o ABD pad - secure with tape o Dry Gauze Dressing Change Frequency Wound #4 Midline Sacrum o Change dressing every day. Follow-up Appointments Wound #4 Midline Sacrum o Return Appointment in 2 weeks. Off-Loading Wound #4 Midline Sacrum o Roho cushion for wheelchair - pt needs Roho cushion for her chair o Mattress - Pt needs Air mattress o Turn and reposition every 2 hours - Pt is still getting pressure to her midline sacrum. Please reposition pt EVERY 2 HOURS Negative Pressure Wound Therapy Wound #4 Midline Sacrum o Place NPWT on HOLD. -  Until infection is under control Electronic Signature(s) Signed: 11/03/2019 1:58:43 PM By: Army Melia Signed: 11/03/2019 4:21:54 PM By: Worthy Keeler PA-C Entered By: Army Melia on 11/03/2019 09:50:49 [place] Crane, Aundra Dubin ([id number]) -------------------------------------------------------------------------------- Problem List Details Patient Name: [name] Date of Service: 11/03/2019 9:15 AM Medical Record Number: [id number] Patient Account Number: [id number] Date of Birth/Sex: 07/16/1928 (86 y.o. F) Treating RN: Army Melia Primary Care Provider: Benita Stabile Other Clinician: Referring Provider: Benita Stabile Treating Provider/Extender: Melburn Hake, Jaycen Vercher Weeks in Treatment: 35 Active Problems ICD-10 Encounter Code Description Active Date MDM Diagnosis L89.153 Pressure ulcer of sacral region, stage 3 03/24/2019 No Yes I10 Essential (primary) hypertension 03/03/2019 No Yes F01.50 Vascular dementia without behavioral disturbance 03/03/2019 No Yes Inactive Problems ICD-10 Code Description Active Date Inactive Date L89.623  Pressure ulcer of left heel, stage 3 03/03/2019 03/03/2019 L89.513 Pressure ulcer of right ankle, stage 3 03/03/2019 03/03/2019 L89.224 Pressure ulcer of left hip, stage 4 03/03/2019 03/03/2019 Resolved Problems Electronic Signature(s) Signed: 11/03/2019 9:22:55 AM By: Worthy Keeler PA-C Entered By: Worthy Keeler on 11/03/2019 09:22:54 [name] ([id number]) -------------------------------------------------------------------------------- Progress Note Details Patient Name: [name] Date of Service: 11/03/2019 9:15 AM Medical Record Number: [id number] Patient Account Number: [id number] Date of Birth/Sex: July 30, 1928 (86 y.o. F) Treating RN: Army Melia Primary Care Provider: Benita Stabile Other Clinician: Referring Provider: Benita Stabile Treating Provider/Extender: Melburn Hake, Khayden Herzberg Weeks in Treatment: 35 Subjective Chief Complaint Information obtained from Patient Multiple pressure ulcers History of Present Illness (HPI) 03/03/2019 on evaluation today patient presents for initial evaluation here in the clinic today concerning issues that she has been having with wounds which are pressure in nature at multiple locations currently. This is on her left heel, right ankle, left hip/ischial location, and all her to varying degrees of severity and depth. The worst is on the left ischial location. At this time the patient has been tolerating dressing changes although I am not exactly sure the dressings that have been utilized prior to coming in today. She does have some necrotic tissue noted at several locations this can require at least some degree of cleaning prior to application of dressings going forward. The patient does have dementia and unfortunately in recent months has become increasingly immobile requiring more significant treatment she was just recently moved roughly 2 weeks ago from assisted living to a skilled nursing facility at [facility]. Based on what I am seeing  currently these pressure injuries occurred prior to that 2-week time I do not see any new or obvious pressure injury at this point. I discussed with the patient as well as her family member that this does appear to be doing better in my opinion than likely where things started. They are unsure as to whether or not she has an air mattress at the facility I do think that would be something that would be appropriate and good for her to have but at the moment I do not know for sure whether that is already in place if not that definitely is can be 1 of the recommendations. Also think a Roho cushion for her wheelchair would be good as well as Prevalon offloading boots. The patient currently does not have any severe pain although when I was cleaning some of the areas she did note there was some discomfort at several of the locations. She does have a history of hypertension as well. 10/29; this is a patient from [facility]. She is nonambulatory. She is here  for follow-up on 4 different wounds 1 over the left lateral malleolus, the right medial calcaneus, the left posterior hip and I believe a new area over the sacrum. The patient is nonambulatory but apparently eats well. 03/24/2019 on evaluation today patient actually appears to be doing better with regard to her wounds in general based on what I am seeing. Fortunately there is no signs of active infection at this time. No fever chills noted. Overall been very pleased with the progress that seems to be occurring since I last saw her. There is a little bit of debridement That will need to be performed today. 04/07/2019 on evaluation today patient actually appears to be doing well in some regards based on what I am seeing today. Fortunately there is no signs of active infection at this time which is good news. No fever chills noted. With that being said the wounds on her foot and ankle region in general have healed at this point. I do not see  anything open in that region. With regard to the other 2 wounds in the left ischium and midline sacral region I believe that we may need to make a dressing change at this point to do something a little bit more conducive to cleaning up the wound bed. The patient is in agreement with that plan today. 04/21/2019 on evaluation today patient actually appears to be doing well with regard to her wounds all things considered. Both appear to be much cleaner than what they were previous. Fortunately there is no signs of active infection at this time. Both I think are doing well with the current wound care measures which is the Dakin moistened gauze dressings. 05/05/2019 on evaluation today patient seems to be doing much better in regard to her ischial ulcer unfortunately she still seems to be getting pressure to the sacral region. I think that this is evidenced by the fact that she does have some surrounding deep tissue injury and the fact that the wound is actually measuring larger not better. Fortunately there is no evidence of active infection at this time I do believe the Dakin's is still the best option treatment wise but again I do think the patient needs more appropriate offloading on the sacral region. 06/24/2019 on evaluation today patient appears to be doing well with regard to the wound over the ischium which appears to be completely healed. This is great news. Fortunately there does appear to be some improvement with regard to the sacral wound as well. There still does seem to be evidence of necrotic tissue noted minimally that I was able to gently clean away. With that being said the wound itself seems to be doing quite well which is good news. She seems to be offloaded appropriately in my opinion at this point. I think she may be ready for a wound VAC. 07/14/2019 on evaluation today patient appears to be doing well with regard to her sacral wound in general although unfortunately she does appear to  have some evidence of potential infection there is some odor that seems to be a little stronger than just what would normally be noted with the wound VAC. With that being said I do think we need to address this and though I think the wound VAC is appropriate to continue I think that we do need to see about a culture as well as placing her on some antibiotic therapy at this point. The patient's granddaughter was present during evaluation today as well. 07/28/2019 upon evaluation  today patient unfortunately appears to not be doing quite as well as I would like with regard to the wound VAC and really was not applied appropriately. The phone was applied directly to the skin which did cause a little bit of breakdown around the opening of the wound. Subsequently is also unfortunately the case that the foam that was down at the sacral region and that was tracked up and along her back was not actually connected so they touched this obviously inhibits the ability to be able to properly drain fluid from 1 to the other the have to be continuous and touching. With that being said there was some improvement today in the overall appearance of the wound bed though obviously size wise not much difference today. We are going to get in touch with the facility to try to make sure that they know what they are doing with applying the wound VAC I am also can likely see her for more frequent follow-up for the time being. All this was discussed with the patient's grandson during the office visit today as well. 08/11/19 upon evaluation today patient appears to be doing poorly in regard to her wound at this time. Unfortunately there appears to be infection at this time including blue-green drainage and a poor quality to the granulation tissue all of which point toward infection she also seems to be much more painful than we noticed in the past. She is moving around a lot just with very light touch around the area I think that is  an indication of discomfort Reidinger, Lindia A. ([id number]) 08/18/2019 upon evaluation today patient appears to be doing excellent in regard to her wound as compared to last evaluation. There is still some odor but she does not have as much of the necrotic tissue nor the significant drainage that I noted at the last visit. There still does appear to be evidence of infection and she still is much more tender than what she was experiencing prior to the infection noted last week. 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 I do believe that she likely is getting need to see infectious disease due to the ongoing nature of this infection. For now would you still have to hold off on reinitiating the wound VAC. 08/25/2019 upon evaluation today patient appears to be doing excellent with regard to the overall appearance of the wound though there is still is an odor and I do think that though the wound VAC would be best for her were not quite at the point of getting that on yet she actually has her appointment with infectious disease this afternoon it sounds like this will be with Dr. Sampson Goon. Fortunately there is no signs of systemic infection at this time. 09/01/2019 upon evaluation today patient appears to be doing excellent at this point in regard to her sacral wound. In fact I do not even notice any odor once everything was cleaned away and I came into the room to evaluate the patient the wound seems to be doing very well. There is excellent granulation, no necrotic tissue, and overall I think the Dakin's solution has done extremely well. I did talk with Dr. Sampson Goon who saw the patient in the interim earlier this week and of note her lab work and everything else checked out just fine and Dr. Sampson Goon really was not concerned about the possibility of the need for IV antibiotic therapy. Obviously if anything worsens  or changes in that may still be a  consideration but right now she seems to actually be doing extremely well on all fronts. I think the Dakin's moistened gauze is actually a very good option for her to continue at this point. 09/15/2019 upon evaluation today patient appears to be doing well with regard to her wound in general. Fortunately there is no signs of significant infection overall I feel like she is doing excellent in that regard. With that being said I think the Dakin's moistened gauze packing has done extremely well for her 5/13; 2-week follow-up. The patient has a deep wound on the lower sacral area however generally healthy looking surface over the bone here. There was no palpable bone no surrounding infection. I see that they have been using Dakin's moistened wet-to-dry. A wound VAC is apparently on hold. 10/20/2019 upon evaluation today patient's wound bed actually showed signs of good granulation and epithelization she seems to be doing excellent as far as the continuation of feeling of the wound is concerned there does not appear to be any significant slough or breakdown and no significant infection at this point. Overall I am extremely pleased with where things stand today. Her family member who is here today is very happy to hear this. 11/03/2019 upon evaluation today patient appears to be doing excellent at this point with regard to her wound in general although is not significantly smaller in size there appears to be a lot of maceration. I think the biggest issue here may be that she really needs to have more of a dressing to help control the drainage. I think possibly silver alginate with dry gauze and behind may be ideal for her. Objective Constitutional Well-nourished and well-hydrated in no acute distress. Vitals Time Taken: 9:25 AM, Height: 67 in, Weight: 140 lbs, BMI: 21.9, Temperature: 97.7 F, Pulse: 86 bpm, Respiratory Rate: 16 breaths/min, Blood Pressure: 118/65 mmHg. Respiratory normal breathing  without difficulty. Psychiatric Patient is not able to cooperate in decision making regarding care. Patient has dementia. pleasant and cooperative. General Notes: Patient's wound bed currently again showed signs of fairly good granulation there is no signs of active infection at this time which is great news overall I am very pleased with that. With that being said I think we may need to switch up her dressings a little bit here to help with this to proceed in a better way as far as healing is concerned. Integumentary (Hair, Skin) Wound #4 status is Open. Original cause of wound was Pressure Injury. The wound is located on the Midline Sacrum. The wound measures 3.5cm length x 2.5cm width x 1.5cm depth; 6.872cm^2 area and 10.308cm^3 volume. There is Fat Layer (Subcutaneous Tissue) Exposed exposed. There is no tunneling noted, however, there is undermining starting at 12:00 and ending at 6:00 with a maximum distance of 1.2cm. There is a medium amount of serous drainage noted. The wound margin is indistinct and nonvisible. There is large (67-100%) red granulation within the wound bed. There is a small (1-33%) amount of necrotic tissue within the wound bed including Adherent Slough. Assessment Easterly, Sully A. ([id number]) Active Problems ICD-10 Pressure ulcer of sacral region, stage 3 Essential (primary) hypertension Vascular dementia without behavioral disturbance Plan Wound Cleansing: Wound #4 Midline Sacrum: Clean wound with Normal Saline. Cleanse wound with mild soap and water Primary Wound Dressing: Wound #4 Midline Sacrum: Silver Alginate Secondary Dressing: Wound #4 Midline Sacrum: ABD pad - secure with tape Dry Gauze Dressing Change Frequency: Wound #4  Midline Sacrum: Change dressing every day. Follow-up Appointments: Wound #4 Midline Sacrum: Return Appointment in 2 weeks. Off-Loading: Wound #4 Midline Sacrum: Roho cushion for wheelchair - pt needs Roho cushion for her  chair Mattress - Pt needs Air mattress Turn and reposition every 2 hours - Pt is still getting pressure to her midline sacrum. Please reposition pt EVERY 2 HOURS Negative Pressure Wound Therapy: Wound #4 Midline Sacrum: Place NPWT on HOLD. - Until infection is under control 1. I would recommend currently that we go ahead and continue with the wound care measures as before with regard to offloading I still think she needs to be repositioned every 2 hours. 2. I am to switch to silver alginate dressing with dry gauze packed in behind secured with ABD and tape. 3. She does need to continue to have an air mattress and Roho cushion for her chair. We will see patient back for reevaluation in 1 week here in the clinic. If anything worsens or changes patient will contact our office for additional recommendations. Electronic Signature(s) Signed: 11/03/2019 10:01:56 AM By: Lenda Kelp PA-C Entered By: Lenda Kelp on 11/03/2019 10:01:55 [name] ([id number]) -------------------------------------------------------------------------------- SuperBill Details Patient Name: [name] Date of Service: 11/03/2019 Medical Record Number: [id number] Patient Account Number: [id number] Date of Birth/Sex: [birth year] (86 y.o. F) Treating RN: Rodell Perna Primary Care Provider: Dewaine Oats Other Clinician: Referring Provider: TATE, Katherina Right Treating Provider/Extender: STONE III, Saniyyah Elster Weeks in Treatment: 35 Diagnosis Coding ICD-10 Codes Code Description L89.153 Pressure ulcer of sacral region, stage 3 I10 Essential (primary) hypertension F01.50 Vascular dementia without behavioral disturbance Facility Procedures CPT4 Code: [id number] Description: 99213 - WOUND CARE VISIT-LEV 3 EST PT Modifier: Quantity: 1 Physician Procedures CPT4 Code: [id number] Description: 99214 - WC PHYS LEVEL 4 - EST PT Modifier: Quantity: 1 CPT4 Code: Description: ICD-10 Diagnosis Description L89.153 Pressure ulcer of  sacral region, stage 3 I10 Essential (primary) hypertension F01.50 Vascular dementia without behavioral disturbance Modifier: Quantity: Electronic Signature(s) Signed: 11/03/2019 10:02:26 AM By: Lenda Kelp PA-C Entered By: Lenda Kelp on 11/03/2019 10:02:25

## 2019-11-17 ENCOUNTER — Encounter: Payer: Medicare Other | Attending: Physician Assistant | Admitting: Physician Assistant

## 2019-11-17 ENCOUNTER — Other Ambulatory Visit: Payer: Self-pay

## 2019-11-17 DIAGNOSIS — I10 Essential (primary) hypertension: Secondary | ICD-10-CM | POA: Diagnosis not present

## 2019-11-17 DIAGNOSIS — M1990 Unspecified osteoarthritis, unspecified site: Secondary | ICD-10-CM | POA: Insufficient documentation

## 2019-11-17 DIAGNOSIS — Z6822 Body mass index (BMI) 22.0-22.9, adult: Secondary | ICD-10-CM | POA: Insufficient documentation

## 2019-11-17 DIAGNOSIS — L89153 Pressure ulcer of sacral region, stage 3: Secondary | ICD-10-CM | POA: Insufficient documentation

## 2019-11-17 DIAGNOSIS — H409 Unspecified glaucoma: Secondary | ICD-10-CM | POA: Insufficient documentation

## 2019-11-17 DIAGNOSIS — F0150 Vascular dementia without behavioral disturbance: Secondary | ICD-10-CM | POA: Insufficient documentation

## 2019-11-17 NOTE — Progress Notes (Addendum)
[name] [name] ([id number]) Visit Report for 11/17/2019 Chief Complaint Document Details Patient Name: CHERIKA, JESSIE A. Date of Service: 11/17/2019 10:00 AM Medical Record Number: [id number] Patient Account Number: [id number] Date of Birth/Sex: July 06, 1928 (86 y.o. F) Treating RN: Rodell Perna Primary Care Provider: Dewaine Oats Other Clinician: Referring Provider: Dewaine Oats Treating Provider/Extender: Linwood Dibbles, Frankie Zito Weeks in Treatment: 37 Information Obtained from: Patient Chief Complaint Multiple pressure ulcers Electronic Signature(s) Signed: 11/17/2019 10:00:55 AM By: Lenda Kelp PA-C Entered By: Lenda Kelp on 11/17/2019 10:00:54 [name] Lye A. ([id number]) -------------------------------------------------------------------------------- HPI Details Patient Name: [name] Lye A. Date of Service: 11/17/2019 10:00 AM Medical Record Number: [id number] Patient Account Number: [id number] Date of Birth/Sex: 1928/12/18 (86 y.o. F) Treating RN: Rodell Perna Primary Care Provider: Dewaine Oats Other Clinician: Referring Provider: Dewaine Oats Treating Provider/Extender: Linwood Dibbles, Eastyn Dattilo Weeks in Treatment: 37 History of Present Illness HPI Description: 03/03/2019 on evaluation today patient presents for initial evaluation here in the clinic today concerning issues that she has been having with wounds which are pressure in nature at multiple locations currently. This is on her left heel, right ankle, left hip/ischial location, and all her to varying degrees of severity and depth. The worst is on the left ischial location. At this time the patient has been tolerating dressing changes although I am not exactly sure the dressings that have been utilized prior to coming in today. She does have some necrotic tissue noted at several locations this can require at least some degree of cleaning prior to application of dressings going forward. The patient does have dementia and unfortunately in recent months  has become increasingly immobile requiring more significant treatment she was just recently moved roughly 2 weeks ago from assisted living to a skilled nursing facility at [facility]. Based on what I am seeing currently these pressure injuries occurred prior to that 2-week time I do not see any new or obvious pressure injury at this point. I discussed with the patient as well as her family member that this does appear to be doing better in my opinion than likely where things started. They are unsure as to whether or not she has an air mattress at the facility I do think that would be something that would be appropriate and good for her to have but at the moment I do not know for sure whether that is already in place if not that definitely is can be 1 of the recommendations. Also think a Roho cushion for her wheelchair would be good as well as Prevalon offloading boots. The patient currently does not have any severe pain although when I was cleaning some of the areas she did note there was some discomfort at several of the locations. She does have a history of hypertension as well. 10/29; this is a patient from [facility] nursing home. She is nonambulatory. She is here for follow-up on 4 different wounds 1 over the left lateral malleolus, the right medial calcaneus, the left posterior hip and I believe a new area over the sacrum. The patient is nonambulatory but apparently eats well. 03/24/2019 on evaluation today patient actually appears to be doing better with regard to her wounds in general based on what I am seeing. Fortunately there is no signs of active infection at this time. No fever chills noted. Overall been very pleased with the progress that seems to be occurring since I last saw her. There is a little bit of debridement That will need to be performed  today. 04/07/2019 on evaluation today patient actually appears to be doing well in some regards based on what I am seeing today.  Fortunately there is no signs of active infection at this time which is good news. No fever chills noted. With that being said the wounds on her foot and ankle region in general have healed at this point. I do not see anything open in that region. With regard to the other 2 wounds in the left ischium and midline sacral region I believe that we may need to make a dressing change at this point to do something a little bit more conducive to cleaning up the wound bed. The patient is in agreement with that plan today. 04/21/2019 on evaluation today patient actually appears to be doing well with regard to her wounds all things considered. Both appear to be much cleaner than what they were previous. Fortunately there is no signs of active infection at this time. Both I think are doing well with the current wound care measures which is the Dakin moistened gauze dressings. 05/05/2019 on evaluation today patient seems to be doing much better in regard to her ischial ulcer unfortunately she still seems to be getting pressure to the sacral region. I think that this is evidenced by the fact that she does have some surrounding deep tissue injury and the fact that the wound is actually measuring larger not better. Fortunately there is no evidence of active infection at this time I do believe the Dakin's is still the best option treatment wise but again I do think the patient needs more appropriate offloading on the sacral region. 06/24/2019 on evaluation today patient appears to be doing well with regard to the wound over the ischium which appears to be completely healed. This is great news. Fortunately there does appear to be some improvement with regard to the sacral wound as well. There still does seem to be evidence of necrotic tissue noted minimally that I was able to gently clean away. With that being said the wound itself seems to be doing quite well which is good news. She seems to be offloaded appropriately in  my opinion at this point. I think she may be ready for a wound VAC. 07/14/2019 on evaluation today patient appears to be doing well with regard to her sacral wound in general although unfortunately she does appear to have some evidence of potential infection there is some odor that seems to be a little stronger than just what would normally be noted with the wound VAC. With that being said I do think we need to address this and though I think the wound VAC is appropriate to continue I think that we do need to see about a culture as well as placing her on some antibiotic therapy at this point. The patient's granddaughter was present during evaluation today as well. 07/28/2019 upon evaluation today patient unfortunately appears to not be doing quite as well as I would like with regard to the wound VAC and really was not applied appropriately. The phone was applied directly to the skin which did cause a little bit of breakdown around the opening of the wound. Subsequently is also unfortunately the case that the foam that was down at the sacral region and that was tracked up and along her back was not actually connected so they touched this obviously inhibits the ability to be able to properly drain fluid from 1 to the other the have to be continuous and touching. With  that being said there was some improvement today in the overall appearance of the wound bed though obviously size wise not much difference today. We are going to get in touch with the facility to try to make sure that they know what they are doing with applying the wound VAC I am also can likely see her for more frequent follow-up for the time being. All this was discussed with the patient's grandson during the office visit today as well. 08/11/19 upon evaluation today patient appears to be doing poorly in regard to her wound at this time. Unfortunately there appears to be infection at this time including blue-green drainage and a poor quality  to the granulation tissue all of which point toward infection she also seems to be much more painful than we noticed in the past. She is moving around a lot just with very light touch around the area I think that is an indication of discomfort 08/18/2019 upon evaluation today patient appears to be doing excellent in regard to her wound as compared to last evaluation. There is still some odor but she does not have as much of the necrotic tissue nor the significant drainage that I noted at the last visit. There still does appear to be evidence of infection and she still is much more tender than what she was experiencing prior to the infection noted last week. 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 I do believe that she likely is getting need to see infectious Leser, Leeya A. ([id number]) disease due to the ongoing nature of this infection. For now would you still have to hold off on reinitiating the wound VAC. 08/25/2019 upon evaluation today patient appears to be doing excellent with regard to the overall appearance of the wound though there is still is an odor and I do think that though the wound VAC would be best for her were not quite at the point of getting that on yet she actually has her appointment with infectious disease this afternoon it sounds like this will be with Dr. Sampson Goon. Fortunately there is no signs of systemic infection at this time. 09/01/2019 upon evaluation today patient appears to be doing excellent at this point in regard to her sacral wound. In fact I do not even notice any odor once everything was cleaned away and I came into the room to evaluate the patient the wound seems to be doing very well. There is excellent granulation, no necrotic tissue, and overall I think the Dakin's solution has done extremely well. I did talk with Dr. Sampson Goon who saw the patient in the interim earlier this week and of note her lab  work and everything else checked out just fine and Dr. Sampson Goon really was not concerned about the possibility of the need for IV antibiotic therapy. Obviously if anything worsens or changes in that may still be a consideration but right now she seems to actually be doing extremely well on all fronts. I think the Dakin's moistened gauze is actually a very good option for her to continue at this point. 09/15/2019 upon evaluation today patient appears to be doing well with regard to her wound in general. Fortunately there is no signs of significant infection overall I feel like she is doing excellent in that regard. With that being said I think the Dakin's moistened gauze packing has done extremely well for her 5/13; 2-week follow-up. The patient has a deep wound on  the lower sacral area however generally healthy looking surface over the bone here. There was no palpable bone no surrounding infection. I see that they have been using Dakin's moistened wet-to-dry. A wound VAC is apparently on hold. 10/20/2019 upon evaluation today patient's wound bed actually showed signs of good granulation and epithelization she seems to be doing excellent as far as the continuation of feeling of the wound is concerned there does not appear to be any significant slough or breakdown and no significant infection at this point. Overall I am extremely pleased with where things stand today. Her family member who is here today is very happy to hear this. 11/03/2019 upon evaluation today patient appears to be doing excellent at this point with regard to her wound in general although is not significantly smaller in size there appears to be a lot of maceration. I think the biggest issue here may be that she really needs to have more of a dressing to help control the drainage. I think possibly silver alginate with dry gauze and behind may be ideal for her. 11/17/2019 upon evaluation today patient's wound actually appears to be doing  quite well at this point. There is no evidence of active infection and overall I feel like this is doing quite nicely. We are still packing this with a silver alginate dressing. His granddaughter is present during evaluation today. Electronic Signature(s) Signed: 11/17/2019 3:58:00 PM By: Lenda Kelp PA-C Entered By: Lenda Kelp on 11/17/2019 15:58:00 [name] [name] ([id number]) -------------------------------------------------------------------------------- Physical Exam Details Patient Name: [name] Lye A. Date of Service: 11/17/2019 10:00 AM Medical Record Number: [id number] Patient Account Number: [id number] Date of Birth/Sex: 28-Dec-1928 (86 y.o. F) Treating RN: Rodell Perna Primary Care Provider: Dewaine Oats Other Clinician: Referring Provider: TATE, Katherina Right Treating Provider/Extender: STONE III, Jancarlos Thrun Weeks in Treatment: 37 Constitutional Well-nourished and well-hydrated in no acute distress. Respiratory normal breathing without difficulty. Psychiatric this patient is able to make decisions and demonstrates good insight into disease process. Alert and Oriented x 3. pleasant and cooperative. Notes Upon inspection patient's wound bed showed signs of good granulation and has made progress since last time I saw her. This is very slow but nonetheless I do feel like she is improving to some degree here. There is no evidence of active infection at this time. She does appear to have had a skin tear on her right upper arm this is completely healed but it does look as if the skin tear healed with the skin rolled back on itself which is made somewhat of a ridge on the arm in the manner that it finally sealed up. There is really no way to fix this without treatment at all and then the patient would have to heal the new wound from this at that point. Electronic Signature(s) Signed: 11/17/2019 3:58:42 PM By: Lenda Kelp PA-C Entered By: Lenda Kelp on 11/17/2019 15:58:41 [name] [name]  ([id number]) -------------------------------------------------------------------------------- Physician Orders Details Patient Name: [name] Lye A. Date of Service: 11/17/2019 10:00 AM Medical Record Number: [id number] Patient Account Number: [id number] Date of Birth/Sex: 11/06/1928 (86 y.o. F) Treating RN: Rodell Perna Primary Care Provider: Dewaine Oats Other Clinician: Referring Provider: TATE, Katherina Right Treating Provider/Extender: Linwood Dibbles, Airianna Kreischer Weeks in Treatment: 37 Verbal / Phone Orders: No Diagnosis Coding ICD-10 Coding Code Description L89.153 Pressure ulcer of sacral region, stage 3 I10 Essential (primary) hypertension F01.50 Vascular dementia without behavioral disturbance Wound Cleansing Wound #4 Midline Sacrum o Clean wound with Normal Saline. o Cleanse wound  with mild soap and water Primary Wound Dressing Wound #4 Midline Sacrum o Silver Alginate Secondary Dressing Wound #4 Midline Sacrum o ABD pad - secure with tape o Dry Gauze Dressing Change Frequency Wound #4 Midline Sacrum o Change dressing every day. Follow-up Appointments Wound #4 Midline Sacrum o Return Appointment in 3 weeks. Off-Loading Wound #4 Midline Sacrum o Roho cushion for wheelchair - pt needs Roho cushion for her chair o Mattress - Pt needs Air mattress o Turn and reposition every 2 hours - Pt is still getting pressure to her midline sacrum. Please reposition pt EVERY 2 HOURS Negative Pressure Wound Therapy Wound #4 Midline Sacrum o Place NPWT on HOLD. - Until infection is under control Electronic Signature(s) Signed: 11/17/2019 3:55:13 PM By: Rodell PernaScott, Dajea Signed: 11/17/2019 4:07:16 PM By: Lenda KelpStone III, Nakeisha Greenhouse PA-C Entered By: Rodell PernaScott, Dajea on 11/17/2019 10:18:19 [name] [name], [name] ([id number]) -------------------------------------------------------------------------------- Problem List Details Patient Name: [name] [name], [name] Date of Service: 11/17/2019 10:00 AM Medical Record Number:  [id number] Patient Account Number: [id number] Date of Birth/Sex: 01/07/1929 (86 y.o. F) Treating RN: Rodell PernaScott, Dajea Primary Care Provider: Dewaine OatsATE, DENNY Other Clinician: Referring Provider: Dewaine OatsATE, DENNY Treating Provider/Extender: Linwood DibblesSTONE III, Lalonnie Shaffer Weeks in Treatment: 37 Active Problems ICD-10 Encounter Code Description Active Date MDM Diagnosis L89.153 Pressure ulcer of sacral region, stage 3 03/24/2019 No Yes I10 Essential (primary) hypertension 03/03/2019 No Yes F01.50 Vascular dementia without behavioral disturbance 03/03/2019 No Yes Inactive Problems ICD-10 Code Description Active Date Inactive Date L89.623 Pressure ulcer of left heel, stage 3 03/03/2019 03/03/2019 L89.513 Pressure ulcer of right ankle, stage 3 03/03/2019 03/03/2019 L89.224 Pressure ulcer of left hip, stage 4 03/03/2019 03/03/2019 Resolved Problems Electronic Signature(s) Signed: 11/17/2019 10:00:48 AM By: Lenda KelpStone III, Metzli Pollick PA-C Entered By: Lenda KelpStone III, Ladoris Lythgoe on 11/17/2019 10:00:48 [name] [name], Tanikka A. ([id number]) -------------------------------------------------------------------------------- Progress Note Details Patient Name: [name] [name], [name] Date of Service: 11/17/2019 10:00 AM Medical Record Number: [id number] Patient Account Number: [id number] Date of Birth/Sex: 05/31/1928 (86 y.o. F) Treating RN: Rodell PernaScott, Dajea Primary Care Provider: Dewaine OatsATE, DENNY Other Clinician: Referring Provider: Dewaine OatsATE, DENNY Treating Provider/Extender: Linwood DibblesSTONE III, Joyell Emami Weeks in Treatment: 37 Subjective Chief Complaint Information obtained from Patient Multiple pressure ulcers History of Present Illness (HPI) 03/03/2019 on evaluation today patient presents for initial evaluation here in the clinic today concerning issues that she has been having with wounds which are pressure in nature at multiple locations currently. This is on her left heel, right ankle, left hip/ischial location, and all her to varying degrees of severity and depth. The worst is on the left  ischial location. At this time the patient has been tolerating dressing changes although I am not exactly sure the dressings that have been utilized prior to coming in today. She does have some necrotic tissue noted at several locations this can require at least some degree of cleaning prior to application of dressings going forward. The patient does have dementia and unfortunately in recent months has become increasingly immobile requiring more significant treatment she was just recently moved roughly 2 weeks ago from assisted living to a skilled nursing facility at [facility]. Based on what I am seeing currently these pressure injuries occurred prior to that 2-week time I do not see any new or obvious pressure injury at this point. I discussed with the patient as well as her family member that this does appear to be doing better in my opinion than likely where things started. They are unsure as to whether or not she has an air mattress at the  facility I do think that would be something that would be appropriate and good for her to have but at the moment I do not know for sure whether that is already in place if not that definitely is can be 1 of the recommendations. Also think a Roho cushion for her wheelchair would be good as well as Prevalon offloading boots. The patient currently does not have any severe pain although when I was cleaning some of the areas she did note there was some discomfort at several of the locations. She does have a history of hypertension as well. 10/29; this is a patient from [facility] nursing home. She is nonambulatory. She is here for follow-up on 4 different wounds 1 over the left lateral malleolus, the right medial calcaneus, the left posterior hip and I believe a new area over the sacrum. The patient is nonambulatory but apparently eats well. 03/24/2019 on evaluation today patient actually appears to be doing better with regard to her wounds in general based  on what I am seeing. Fortunately there is no signs of active infection at this time. No fever chills noted. Overall been very pleased with the progress that seems to be occurring since I last saw her. There is a little bit of debridement That will need to be performed today. 04/07/2019 on evaluation today patient actually appears to be doing well in some regards based on what I am seeing today. Fortunately there is no signs of active infection at this time which is good news. No fever chills noted. With that being said the wounds on her foot and ankle region in general have healed at this point. I do not see anything open in that region. With regard to the other 2 wounds in the left ischium and midline sacral region I believe that we may need to make a dressing change at this point to do something a little bit more conducive to cleaning up the wound bed. The patient is in agreement with that plan today. 04/21/2019 on evaluation today patient actually appears to be doing well with regard to her wounds all things considered. Both appear to be much cleaner than what they were previous. Fortunately there is no signs of active infection at this time. Both I think are doing well with the current wound care measures which is the Dakin moistened gauze dressings. 05/05/2019 on evaluation today patient seems to be doing much better in regard to her ischial ulcer unfortunately she still seems to be getting pressure to the sacral region. I think that this is evidenced by the fact that she does have some surrounding deep tissue injury and the fact that the wound is actually measuring larger not better. Fortunately there is no evidence of active infection at this time I do believe the Dakin's is still the best option treatment wise but again I do think the patient needs more appropriate offloading on the sacral region. 06/24/2019 on evaluation today patient appears to be doing well with regard to the wound over the  ischium which appears to be completely healed. This is great news. Fortunately there does appear to be some improvement with regard to the sacral wound as well. There still does seem to be evidence of necrotic tissue noted minimally that I was able to gently clean away. With that being said the wound itself seems to be doing quite well which is good news. She seems to be offloaded appropriately in my opinion at this point. I think she may  be ready for a wound VAC. 07/14/2019 on evaluation today patient appears to be doing well with regard to her sacral wound in general although unfortunately she does appear to have some evidence of potential infection there is some odor that seems to be a little stronger than just what would normally be noted with the wound VAC. With that being said I do think we need to address this and though I think the wound VAC is appropriate to continue I think that we do need to see about a culture as well as placing her on some antibiotic therapy at this point. The patient's granddaughter was present during evaluation today as well. 07/28/2019 upon evaluation today patient unfortunately appears to not be doing quite as well as I would like with regard to the wound VAC and really was not applied appropriately. The phone was applied directly to the skin which did cause a little bit of breakdown around the opening of the wound. Subsequently is also unfortunately the case that the foam that was down at the sacral region and that was tracked up and along her back was not actually connected so they touched this obviously inhibits the ability to be able to properly drain fluid from 1 to the other the have to be continuous and touching. With that being said there was some improvement today in the overall appearance of the wound bed though obviously size wise not much difference today. We are going to get in touch with the facility to try to make sure that they know what they are doing  with applying the wound VAC I am also can likely see her for more frequent follow-up for the time being. All this was discussed with the patient's grandson during the office visit today as well. 08/11/19 upon evaluation today patient appears to be doing poorly in regard to her wound at this time. Unfortunately there appears to be infection at this time including blue-green drainage and a poor quality to the granulation tissue all of which point toward infection she also seems to be much more painful than we noticed in the past. She is moving around a lot just with very light touch around the area I think that is an indication of discomfort Vessel, Amylah A. ([id number]) 08/18/2019 upon evaluation today patient appears to be doing excellent in regard to her wound as compared to last evaluation. There is still some odor but she does not have as much of the necrotic tissue nor the significant drainage that I noted at the last visit. There still does appear to be evidence of infection and she still is much more tender than what she was experiencing prior to the infection noted last week. 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 I do believe that she likely is getting need to see infectious disease due to the ongoing nature of this infection. For now would you still have to hold off on reinitiating the wound VAC. 08/25/2019 upon evaluation today patient appears to be doing excellent with regard to the overall appearance of the wound though there is still is an odor and I do think that though the wound VAC would be best for her were not quite at the point of getting that on yet she actually has her appointment with infectious disease this afternoon it sounds like this will be with Dr. Sampson Goon. Fortunately there is no signs of systemic infection at this time. 09/01/2019 upon  evaluation today patient appears to be doing excellent at this point in  regard to her sacral wound. In fact I do not even notice any odor once everything was cleaned away and I came into the room to evaluate the patient the wound seems to be doing very well. There is excellent granulation, no necrotic tissue, and overall I think the Dakin's solution has done extremely well. I did talk with Dr. Sampson Goon who saw the patient in the interim earlier this week and of note her lab work and everything else checked out just fine and Dr. Sampson Goon really was not concerned about the possibility of the need for IV antibiotic therapy. Obviously if anything worsens or changes in that may still be a consideration but right now she seems to actually be doing extremely well on all fronts. I think the Dakin's moistened gauze is actually a very good option for her to continue at this point. 09/15/2019 upon evaluation today patient appears to be doing well with regard to her wound in general. Fortunately there is no signs of significant infection overall I feel like she is doing excellent in that regard. With that being said I think the Dakin's moistened gauze packing has done extremely well for her 5/13; 2-week follow-up. The patient has a deep wound on the lower sacral area however generally healthy looking surface over the bone here. There was no palpable bone no surrounding infection. I see that they have been using Dakin's moistened wet-to-dry. A wound VAC is apparently on hold. 10/20/2019 upon evaluation today patient's wound bed actually showed signs of good granulation and epithelization she seems to be doing excellent as far as the continuation of feeling of the wound is concerned there does not appear to be any significant slough or breakdown and no significant infection at this point. Overall I am extremely pleased with where things stand today. Her family member who is here today is very happy to hear this. 11/03/2019 upon evaluation today patient appears to be doing excellent  at this point with regard to her wound in general although is not significantly smaller in size there appears to be a lot of maceration. I think the biggest issue here may be that she really needs to have more of a dressing to help control the drainage. I think possibly silver alginate with dry gauze and behind may be ideal for her. 11/17/2019 upon evaluation today patient's wound actually appears to be doing quite well at this point. There is no evidence of active infection and overall I feel like this is doing quite nicely. We are still packing this with a silver alginate dressing. His granddaughter is present during evaluation today. Objective Constitutional Well-nourished and well-hydrated in no acute distress. Vitals Time Taken: 10:06 AM, Height: 67 in, Weight: 140 lbs, BMI: 21.9, Temperature: 97.8 F, Pulse: 107 bpm, Respiratory Rate: 16 breaths/min, Blood Pressure: 130/72 mmHg. Respiratory normal breathing without difficulty. Psychiatric this patient is able to make decisions and demonstrates good insight into disease process. Alert and Oriented x 3. pleasant and cooperative. General Notes: Upon inspection patient's wound bed showed signs of good granulation and has made progress since last time I saw her. This is very slow but nonetheless I do feel like she is improving to some degree here. There is no evidence of active infection at this time. She does appear to have had a skin tear on her right upper arm this is completely healed but it does look as if the skin tear healed  with the skin rolled back on itself which is made somewhat of a ridge on the arm in the manner that it finally sealed up. There is really no way to fix this without treatment at all and then the patient would have to heal the new wound from this at that point. Integumentary (Hair, Skin) Wound #4 status is Open. Original cause of wound was Pressure Injury. The wound is located on the Midline Sacrum. The wound measures  4cm length x 2.2cm width x 1.8cm depth; 6.912cm^2 area and 12.441cm^3 volume. There is Fat Layer (Subcutaneous Tissue) Exposed exposed. There is a medium amount of serous drainage noted. The wound margin is indistinct and nonvisible. There is large (67-100%) red granulation within the wound bed. There is a small (1-33%) amount of necrotic tissue within the wound bed including Adherent Slough. [name] [name], [name] ([id number]) Assessment Active Problems ICD-10 Pressure ulcer of sacral region, stage 3 Essential (primary) hypertension Vascular dementia without behavioral disturbance Plan Wound Cleansing: Wound #4 Midline Sacrum: Clean wound with Normal Saline. Cleanse wound with mild soap and water Primary Wound Dressing: Wound #4 Midline Sacrum: Silver Alginate Secondary Dressing: Wound #4 Midline Sacrum: ABD pad - secure with tape Dry Gauze Dressing Change Frequency: Wound #4 Midline Sacrum: Change dressing every day. Follow-up Appointments: Wound #4 Midline Sacrum: Return Appointment in 3 weeks. Off-Loading: Wound #4 Midline Sacrum: Roho cushion for wheelchair - pt needs Roho cushion for her chair Mattress - Pt needs Air mattress Turn and reposition every 2 hours - Pt is still getting pressure to her midline sacrum. Please reposition pt EVERY 2 HOURS Negative Pressure Wound Therapy: Wound #4 Midline Sacrum: Place NPWT on HOLD. - Until infection is under control 1. I would recommend currently that we continue to pack the wound with a silver alginate dressing I think that still the best way to go at this point. 2. I am also can recommend at this time that we go ahead and continue with the ABD pad to cover secured with tape I think this is better than a border foam dressing. 3. I would recommend continued offloading this needs to be performed at the facility every 2 hours the patient should be rotated. We will see patient back for reevaluation in 3 weeks here in the clinic. If  anything worsens or changes patient will contact our office for additional recommendations. Electronic Signature(s) Signed: 11/17/2019 3:59:20 PM By: Lenda Kelp PA-C Entered By: Lenda Kelp on 11/17/2019 15:59:20 [name] [name], [name] [name] ([id number]) -------------------------------------------------------------------------------- SuperBill Details Patient Name: [name] Lye A. Date of Service: 11/17/2019 Medical Record Number: [id number] Patient Account Number: [id number] Date of Birth/Sex: 16-Oct-1928 (86 y.o. F) Treating RN: Rodell Perna Primary Care Provider: Dewaine Oats Other Clinician: Referring Provider: TATE, Katherina Right Treating Provider/Extender: STONE III, Cabell Lazenby Weeks in Treatment: 37 Diagnosis Coding ICD-10 Codes Code Description L89.153 Pressure ulcer of sacral region, stage 3 I10 Essential (primary) hypertension F01.50 Vascular dementia without behavioral disturbance Facility Procedures CPT4 Code: 62831517 Description: 99213 - WOUND CARE VISIT-LEV 3 EST PT Modifier: Quantity: 1 Physician Procedures CPT4 Code: 6160737 Description: 99213 - WC PHYS LEVEL 3 - EST PT Modifier: Quantity: 1 CPT4 Code: Description: ICD-10 Diagnosis Description L89.153 Pressure ulcer of sacral region, stage 3 I10 Essential (primary) hypertension F01.50 Vascular dementia without behavioral disturbance Modifier: Quantity: Electronic Signature(s) Signed: 11/17/2019 3:59:39 PM By: Lenda Kelp PA-C Entered By: Lenda Kelp on 11/17/2019 15:59:39

## 2019-11-17 NOTE — Progress Notes (Addendum)
ALIZEH, MADRIL ([id number]) Visit Report for 11/17/2019 Arrival Information Details Patient Name: BARBI, KUMAGAI A. Date of Service: 11/17/2019 10:00 AM Medical Record Number: [id number] Patient Account Number: [id number] Date of Birth/Sex: 11/02/1928 (86 y.o. F) Treating RN: Rodell Perna Primary Care Aviv Lengacher: Dewaine Oats Other Clinician: Referring Evalette Montrose: Dewaine Oats Treating Meagan Spease/Extender: Linwood Dibbles, HOYT Weeks in Treatment: 37 Visit Information History Since Last Visit Added or deleted any medications: No Patient Arrived: Wheel Chair Any new allergies or adverse reactions: No Arrival Time: 10:05 Had a fall or experienced change in No Accompanied By: grand daughter activities of daily living that may affect Transfer Assistance: None risk of falls: Patient Identification Verified: Yes Signs or symptoms of abuse/neglect since last visito No Secondary Verification Process Completed: Yes Hospitalized since last visit: No Implantable device outside of the clinic excluding No cellular tissue based products placed in the center since last visit: Has Dressing in Place as Prescribed: Yes Pain Present Now: No Electronic Signature(s) Signed: 11/17/2019 12:42:51 PM By: Karl Ito Entered By: Karl Ito on 11/17/2019 10:06:08 [name] ([id number]) -------------------------------------------------------------------------------- Clinic Level of Care Assessment Details Patient Name: [name] Date of Service: 11/17/2019 10:00 AM Medical Record Number: [id number] Patient Account Number: [id number] Date of Birth/Sex: [birth year] (86 y.o. F) Treating RN: Rodell Perna Primary Care Ardell Makarewicz: Dewaine Oats Other Clinician: Referring Latravia Southgate: TATE, Katherina Right Treating Declan Mier/Extender: Linwood Dibbles, HOYT Weeks in Treatment: 37 Clinic Level of Care Assessment Items TOOL 4 Quantity Score []  - Use when only an EandM is performed on FOLLOW-UP visit 0 ASSESSMENTS - Nursing Assessment /  Reassessment X - Reassessment of Co-morbidities (includes updates in patient status) 1 10 X- 1 5 Reassessment of Adherence to Treatment Plan ASSESSMENTS - Wound and Skin Assessment / Reassessment X - Simple Wound Assessment / Reassessment - one wound 1 5 []  - 0 Complex Wound Assessment / Reassessment - multiple wounds []  - 0 Dermatologic / Skin Assessment (not related to wound area) ASSESSMENTS - Focused Assessment []  - Circumferential Edema Measurements - multi extremities 0 []  - 0 Nutritional Assessment / Counseling / Intervention []  - 0 Lower Extremity Assessment (monofilament, tuning fork, pulses) []  - 0 Peripheral Arterial Disease Assessment (using hand held doppler) ASSESSMENTS - Ostomy and/or Continence Assessment and Care []  - Incontinence Assessment and Management 0 []  - 0 Ostomy Care Assessment and Management (repouching, etc.) PROCESS - Coordination of Care X - Simple Patient / Family Education for ongoing care 1 15 []  - 0 Complex (extensive) Patient / Family Education for ongoing care []  - 0 Staff obtains , Records, Test Results / Process Orders X- 1 10 Staff telephones HHA, Nursing Homes / Clarify orders / etc []  - 0 Routine Transfer to another Facility (non-emergent condition) []  - 0 Routine Hospital Admission (non-emergent condition) []  - 0 New Admissions / / Ordering NPWT, Apligraf, etc. []  - 0 Emergency Hospital Admission (emergent condition) X- 1 10 Simple Discharge Coordination []  - 0 Complex (extensive) Discharge Coordination PROCESS - Special Needs []  - Pediatric / Minor Patient Management 0 []  - 0 Isolation Patient Management []  - 0 Hearing / Language / Visual special needs []  - 0 Assessment of Community assistance (transportation, D/C planning, etc.) []  - 0 Additional assistance / Altered mentation []  - 0 Support Surface(s) Assessment (bed, cushion, seat, etc.) INTERVENTIONS - Wound Cleansing /  Measurement Disano, Tiny A. ( ) X- 1 5 Simple Wound Cleansing - one wound []  - 0 Complex Wound Cleansing - multiple wounds X- 1 5 Wound  Imaging (photographs - any number of wounds) []  - 0 Wound Tracing (instead of photographs) X- 1 5 Simple Wound Measurement - one wound []  - 0 Complex Wound Measurement - multiple wounds INTERVENTIONS - Wound Dressings []  - Small Wound Dressing one or multiple wounds 0 X- 1 15 Medium Wound Dressing one or multiple wounds []  - 0 Large Wound Dressing one or multiple wounds []  - 0 Application of Medications - topical []  - 0 Application of Medications - injection INTERVENTIONS - Miscellaneous []  - External ear exam 0 []  - 0 Specimen Collection (cultures, biopsies, blood, body fluids, etc.) []  - 0 Specimen(s) / Culture(s) sent or taken to Lab for analysis []  - 0 Patient Transfer (multiple staff / / Similar devices) []  - 0 Simple Staple / Suture removal (25 or less) []  - 0 Complex Staple / Suture removal (26 or more) []  - 0 Hypo / Hyperglycemic Management (close monitor of Blood Glucose) []  - 0 Ankle / Brachial Index (ABI) - do not check if billed separately X- 1 5 Vital Signs Has the patient been seen at the hospital within the last three years: Yes Total Score: 90 Level Of Care: New/Established - Level 3 Electronic Signature(s) Signed: 11/17/2019 3:55:13 PM By: Entered By: on 11/17/2019 10:18:51 A. ( ) -------------------------------------------------------------------------------- Encounter Discharge Information Details Patient Name: A. Date of Service: 11/17/2019 10:00 AM Medical Record Number: Patient Account Number: Nurse, adult Date of Birth/Sex: 1929-01-20 (86 y.o. F) Treating RN: Primary Care Worley Radermacher: Other Clinician: Referring Selmer Adduci: 01/18/2020 Treating Giovanie Lefebre/Extender: Rodell Perna, HOYT Weeks in Treatment:  37 Encounter Discharge Information Items Discharge Condition: Stable Ambulatory Status: Wheelchair Discharge Destination: Skilled Nursing Facility Telephoned: No Orders Sent: Yes Transportation: Private Auto Accompanied By: family Schedule Follow-up Appointment: Yes Clinical Summary of Care: Electronic Signature(s) Signed: 11/17/2019 3:55:13 PM By: 01/18/2020 Entered By: Christene Lye on 11/17/2019 10:20:02 Christene Lye (01/18/2020) -------------------------------------------------------------------------------- Lower Extremity Assessment Details Patient Name: [id number] A. Date of Service: 11/17/2019 10:00 AM Medical Record Number: 10/21/1928 Patient Account Number: 06-30-2002 Date of Birth/Sex: 1928/08/09 (86 y.o. F) Treating RN: Dewaine Oats Primary Care Javin Nong: Linwood Dibbles Other Clinician: Referring Tiffane Sheldon: 01/18/2020 Treating Nakia Remmers/Extender: Rodell Perna, HOYT Weeks in Treatment: 37 Electronic Signature(s) Signed: 11/17/2019 3:55:13 PM By: 01/18/2020 Entered By: [name] on 11/17/2019 10:14:18 Brees, Retia A. (Christene Lye) -------------------------------------------------------------------------------- Multi Wound Chart Details Patient Name: 01/18/2020 A. Date of Service: 11/17/2019 10:00 AM Medical Record Number: [id number] Patient Account Number: 10/21/1928 Date of Birth/Sex: 13-Oct-1928 (86 y.o. F) Treating RN: Dewaine Oats Primary Care Mardi Cannady: Dewaine Oats Other Clinician: Referring Faythe Heitzenrater: TATE, Linwood Dibbles Treating Zakyria Metzinger/Extender: STONE III, HOYT Weeks in Treatment: 37 Vital Signs Height(in): 67 Pulse(bpm): 107 Weight(lbs): 140 Blood Pressure(mmHg): 130/72 Body Mass Index(BMI): 22 Temperature(F): 97.8 Respiratory Rate(breaths/min): 16 Photos: [N/A:N/A] Wound Location: Midline Sacrum N/A N/A Wounding Event: Pressure Injury N/A N/A Primary Etiology: Pressure Ulcer N/A N/A Comorbid History: Glaucoma, Hypertension, N/A N/A Osteoarthritis, Dementia Date  Acquired: 07/18/2018 N/A N/A Weeks of Treatment: 37 N/A N/A Wound Status: Open N/A N/A Clustered Wound: Yes N/A N/A Clustered Quantity: 1 N/A N/A Measurements L x W x D (cm) 4x2.2x1.8 N/A N/A Area (cm) : 6.912 N/A N/A Volume (cm) : 12.441 N/A N/A % Reduction in Area: 60.00% N/A N/A % Reduction in Volume: -620.00% N/A N/A Classification: Category/Stage IV N/A N/A Exudate Amount: Medium N/A N/A Exudate Type: Serous N/A N/A Exudate Color: amber N/A N/A Wound Margin:  Indistinct, nonvisible N/A N/A Granulation Amount: Large (67-100%) N/A N/A Granulation Quality: Red N/A N/A Necrotic Amount: Small (1-33%) N/A N/A Exposed Structures: Fat Layer (Subcutaneous Tissue) N/A N/A Exposed: Yes Fascia: No Tendon: No Muscle: No Joint: No Bone: No Epithelialization: Small (1-33%) N/A N/A Treatment Notes Electronic Signature(s) Signed: 11/17/2019 3:55:13 PM By: Rodell Perna Entered By: Rodell Perna on 11/17/2019 10:14:31 [name] ([id number]) -------------------------------------------------------------------------------- Multi-Disciplinary Care Plan Details Patient Name: [name] Date of Service: 11/17/2019 10:00 AM Medical Record Number: [id number] Patient Account Number: [id number] Date of Birth/Sex: Jul 07, 1928 (86 y.o. F) Treating RN: Rodell Perna Primary Care Lamount Bankson: Dewaine Oats Other Clinician: Referring Deryck Hippler: Dewaine Oats Treating Karthika Glasper/Extender: Linwood Dibbles, HOYT Weeks in Treatment: 37 Active Inactive Abuse / Safety / Falls / Self Care Management Nursing Diagnoses: Potential for falls Goals: Patient will remain injury free related to falls Date Initiated: 03/03/2019 Target Resolution Date: 03/16/2019 Goal Status: Active Interventions: Assess fall risk on admission and as needed Notes: Medication Nursing Diagnoses: Knowledge deficit related to medication safety: actual or potential Goals: Patient/caregiver will demonstrate understanding of all current  medications Date Initiated: 03/03/2019 Target Resolution Date: 03/16/2019 Goal Status: Active Interventions: Assess for medication contraindications each visit where new medications are prescribed Treatment Activities: New medication prescribed at Wound Center : 03/03/2019 Notes: Necrotic Tissue Nursing Diagnoses: Impaired tissue integrity related to necrotic/devitalized tissue Goals: Necrotic/devitalized tissue will be minimized in the wound bed Date Initiated: 03/03/2019 Target Resolution Date: 03/16/2019 Goal Status: Active Interventions: Assess patient pain level pre-, during and post procedure and prior to discharge Treatment Activities: Apply topical anesthetic as ordered : 03/03/2019 Notes: Nutrition Nursing Diagnoses: Potential for alteratiion in Nutrition/Potential for imbalanced nutrition Demma, Reyana A. ([id number]) Goals: Patient/caregiver agrees to and verbalizes understanding of need to use nutritional supplements and/or vitamins as prescribed Date Initiated: 03/03/2019 Target Resolution Date: 03/16/2019 Goal Status: Active Interventions: Provide education on nutrition Notes: Pressure Nursing Diagnoses: Knowledge deficit related to management of pressures ulcers Goals: Patient/caregiver will verbalize understanding of pressure ulcer management Date Initiated: 03/03/2019 Target Resolution Date: 03/16/2019 Goal Status: Active Interventions: Assess: immobility, friction, shearing, incontinence upon admission and as needed Provide education on pressure ulcers Treatment Activities: Patient referred for home evaluation of offloading devices/mattresses : 03/03/2019 Patient referred for pressure reduction/relief devices : 03/03/2019 Pressure reduction/relief device ordered : 03/03/2019 Notes: Wound/Skin Impairment Nursing Diagnoses: Impaired tissue integrity Goals: Ulcer/skin breakdown will have a volume reduction of 30% by week 4 Date Initiated:  03/03/2019 Target Resolution Date: 03/03/2019 Goal Status: Active Interventions: Assess ulceration(s) every visit Treatment Activities: Skin care regimen initiated : 03/03/2019 Notes: Electronic Signature(s) Signed: 11/17/2019 3:55:13 PM By: Rodell Perna Entered By: Rodell Perna on 11/17/2019 10:14:23 Rusconi, Emarie A. ([id number]) -------------------------------------------------------------------------------- Pain Assessment Details Patient Name: [name] Date of Service: 11/17/2019 10:00 AM Medical Record Number: [id number] Patient Account Number: [id number] Date of Birth/Sex: [birth year] (86 y.o. F) Treating RN: Rodell Perna Primary Care Kareena Arrambide: Dewaine Oats Other Clinician: Referring Kymber Kosar: Dewaine Oats Treating Edwin Cherian/Extender: STONE III, HOYT Weeks in Treatment: 37 Active Problems Location of Pain Severity and Description of Pain Patient Has Paino No Site Locations Pain Management and Medication Current Pain Management: Electronic Signature(s) Signed: 11/17/2019 12:42:51 PM By: Karl Ito Signed: 11/17/2019 3:55:13 PM By: Rodell Perna Entered By: Karl Ito on 11/17/2019 10:07:16 [name] ([id number]) -------------------------------------------------------------------------------- Patient/Caregiver Education Details Patient Name: [name] Date of Service: 11/17/2019 10:00 AM Medical Record Number: [id number] Patient Account Number: [id number] Date of Birth/Gender: 12-Nov-1928 (86 y.o. F) Treating RN:  Rodell PernaScott, Dajea Primary Care Physician: Dewaine OatsATE, DENNY Other Clinician: Referring Physician: Dewaine OatsATE, DENNY Treating Physician/Extender: Skeet SimmerSTONE III, HOYT Weeks in Treatment: 37 Education Assessment Education Provided To: Patient Education Topics Provided Wound/Skin Impairment: Handouts: Caring for Your Ulcer Methods: Demonstration, Explain/Verbal Responses: State content correctly Electronic Signature(s) Signed: 11/17/2019 3:55:13 PM By: Rodell PernaScott, Dajea Entered  By: Rodell PernaScott, Dajea on 11/17/2019 10:19:06 Christene LyeHALL, Kailei A. ([id number]) -------------------------------------------------------------------------------- Wound Assessment Details Patient Name: Christene LyeHALL, Dian A. Date of Service: 11/17/2019 10:00 AM Medical Record Number: [id number] Patient Account Number: [id number] Date of Birth/Sex: 03/02/1929 (86 y.o. F) Treating RN: Rodell PernaScott, Dajea Primary Care Emiliano Welshans: Dewaine OatsATE, DENNY Other Clinician: Referring Anastasios Melander: Dewaine OatsATE, DENNY Treating Jamee Keach/Extender: STONE III, HOYT Weeks in Treatment: 37 Wound Status Wound Number: 4 Primary Etiology: Pressure Ulcer Wound Location: Midline Sacrum Wound Status: Open Wounding Event: Pressure Injury Comorbid History: Glaucoma, Hypertension, Osteoarthritis, Dementia Date Acquired: 07/18/2018 Weeks Of Treatment: 37 Clustered Wound: Yes Photos Wound Measurements Length: (cm) 4 Width: (cm) 2.2 Depth: (cm) 1.8 Clustered Quantity: 1 Area: (cm) 6.91 Volume: (cm) 12.4 % Reduction in Area: 60% % Reduction in Volume: -620% Epithelialization: Small (1-33%) 2 41 Wound Description Classification: Category/Stage IV Wound Margin: Indistinct, nonvisible Exudate Amount: Medium Exudate Type: Serous Exudate Color: amber Foul Odor After Cleansing: No Slough/Fibrino Yes Wound Bed Granulation Amount: Large (67-100%) Exposed Structure Granulation Quality: Red Fascia Exposed: No Necrotic Amount: Small (1-33%) Fat Layer (Subcutaneous Tissue) Exposed: Yes Necrotic Quality: Adherent Slough Tendon Exposed: No Muscle Exposed: No Joint Exposed: No Bone Exposed: No Treatment Notes Wound #4 (Midline Sacrum) Notes silver ag, dry gauze, ABD and tape to midline sacrum Blauvelt, Sumiye A. ([id number]) Electronic Signature(s) Signed: 11/17/2019 3:55:13 PM By: Rodell PernaScott, Dajea Entered By: Rodell PernaScott, Dajea on 11/17/2019 10:14:07 Christene LyeHALL, Kristian A. ([id number]) -------------------------------------------------------------------------------- Vitals  Details Patient Name: Christene LyeHALL, Georgeanne A. Date of Service: 11/17/2019 10:00 AM Medical Record Number: [id number] Patient Account Number: [id number] Date of Birth/Sex: 06/19/1928 (86 y.o. F) Treating RN: Rodell PernaScott, Dajea Primary Care Janssen Zee: Dewaine OatsATE, DENNY Other Clinician: Referring Malashia Kamaka: TATE, Katherina RightENNY Treating Aminta Sakurai/Extender: STONE III, HOYT Weeks in Treatment: 37 Vital Signs Time Taken: 10:06 Temperature (F): 97.8 Height (in): 67 Pulse (bpm): 107 Weight (lbs): 140 Respiratory Rate (breaths/min): 16 Body Mass Index (BMI): 21.9 Blood Pressure (mmHg): 130/72 Reference Range: 80 - 120 mg / dl Electronic Signature(s) Signed: 11/17/2019 12:42:51 PM By: Karl Itoawkins, Destiny Entered By: Karl Itoawkins, Destiny on 11/17/2019 10:07:10

## 2019-12-08 ENCOUNTER — Encounter: Payer: Medicare Other | Admitting: Physician Assistant

## 2019-12-08 ENCOUNTER — Other Ambulatory Visit: Payer: Self-pay

## 2019-12-08 DIAGNOSIS — L89153 Pressure ulcer of sacral region, stage 3: Secondary | ICD-10-CM | POA: Diagnosis not present

## 2019-12-13 NOTE — Progress Notes (Signed)
[name] [name] ([id number]) Visit Report for 12/08/2019 Chief Complaint Document Details Patient Name: [name], [name] Date of Service: 12/08/2019 12:30 PM Medical Record Number: [id number] Patient Account Number: [id number] Date of Birth/Sex: [birth year] (86 y.o. F) Treating RN: [name] [name] Primary Care Provider: Dewaine [name] Other Clinician: Referring Provider: Dewaine [name] Treating Provider/Extender: [name] [name], [name] [name] Weeks in Treatment: 40 Information Obtained from: Patient Chief Complaint Multiple pressure ulcers Electronic Signature(s) Signed: 12/08/2019 1:53:57 PM By: [name] Kelp PA-C Entered By: [name] [name] on 12/08/2019 13:53:57 Wurzer, [name] Stacks A. ([id number]) -------------------------------------------------------------------------------- HPI Details Patient Name: [name] Lye A. Date of Service: 12/08/2019 12:30 PM Medical Record Number: [id number] Patient Account Number: [id number] Date of Birth/Sex: 1928-12-07 (86 y.o. F) Treating RN: [name] [name] Primary Care Provider: Dewaine [name] Other Clinician: Referring Provider: Dewaine [name] Treating Provider/Extender: [name] [name], Lillyan Hitson Weeks in Treatment: 40 History of Present Illness HPI Description: 03/03/2019 on evaluation today patient presents for initial evaluation here in the clinic today concerning issues that she has been having with wounds which are pressure in nature at multiple locations currently. This is on her left heel, right ankle, left hip/ischial location, and all her to varying degrees of severity and depth. The worst is on the left ischial location. At this time the patient has been tolerating dressing changes although I am not exactly sure the dressings that have been utilized prior to coming in today. She does have some necrotic tissue noted at several locations this can require at least some degree of cleaning prior to application of dressings going forward. The patient does have dementia and unfortunately in recent  months has become increasingly immobile requiring more significant treatment she was just recently moved roughly 2 weeks ago from assisted living to a skilled nursing facility at [facility]. Based on what I am seeing currently these pressure injuries occurred prior to that 2-week time I do not see any new or obvious pressure injury at this point. I discussed with the patient as well as her family member that this does appear to be doing better in my opinion than likely where things started. They are unsure as to whether or not she has an air mattress at the facility I do think that would be something that would be appropriate and good for her to have but at the moment I do not know for sure whether that is already in place if not that definitely is can be 1 of the recommendations. Also think a Roho cushion for her wheelchair would be good as well as Prevalon offloading boots. The patient currently does not have any severe pain although when I was cleaning some of the areas she did note there was some discomfort at several of the locations. She does have a history of hypertension as well. 10/29; this is a patient from [facility] nursing home. She is nonambulatory. She is here for follow-up on 4 different wounds 1 over the left lateral malleolus, the right medial calcaneus, the left posterior hip and I believe a new area over the sacrum. The patient is nonambulatory but apparently eats well. 03/24/2019 on evaluation today patient actually appears to be doing better with regard to her wounds in general based on what I am seeing. Fortunately there is no signs of active infection at this time. No fever chills noted. Overall been very pleased with the progress that seems to be occurring since I last saw her. There is a little bit of debridement That will need to be performed  today. 04/07/2019 on evaluation today patient actually appears to be doing well in some regards based on what I am seeing  today. Fortunately there is no signs of active infection at this time which is good news. No fever chills noted. With that being said the wounds on her foot and ankle region in general have healed at this point. I do not see anything open in that region. With regard to the other 2 wounds in the left ischium and midline sacral region I believe that we may need to make a dressing change at this point to do something a little bit more conducive to cleaning up the wound bed. The patient is in agreement with that plan today. 04/21/2019 on evaluation today patient actually appears to be doing well with regard to her wounds all things considered. Both appear to be much cleaner than what they were previous. Fortunately there is no signs of active infection at this time. Both I think are doing well with the current wound care measures which is the Dakin moistened gauze dressings. 05/05/2019 on evaluation today patient seems to be doing much better in regard to her ischial ulcer unfortunately she still seems to be getting pressure to the sacral region. I think that this is evidenced by the fact that she does have some surrounding deep tissue injury and the fact that the wound is actually measuring larger not better. Fortunately there is no evidence of active infection at this time I do believe the Dakin's is still the best option treatment wise but again I do think the patient needs more appropriate offloading on the sacral region. 06/24/2019 on evaluation today patient appears to be doing well with regard to the wound over the ischium which appears to be completely healed. This is great news. Fortunately there does appear to be some improvement with regard to the sacral wound as well. There still does seem to be evidence of necrotic tissue noted minimally that I was able to gently clean away. With that being said the wound itself seems to be doing quite well which is good news. She seems to be offloaded  appropriately in my opinion at this point. I think she may be ready for a wound VAC. 07/14/2019 on evaluation today patient appears to be doing well with regard to her sacral wound in general although unfortunately she does appear to have some evidence of potential infection there is some odor that seems to be a little stronger than just what would normally be noted with the wound VAC. With that being said I do think we need to address this and though I think the wound VAC is appropriate to continue I think that we do need to see about a culture as well as placing her on some antibiotic therapy at this point. The patient's granddaughter was present during evaluation today as well. 07/28/2019 upon evaluation today patient unfortunately appears to not be doing quite as well as I would like with regard to the wound VAC and really was not applied appropriately. The phone was applied directly to the skin which did cause a little bit of breakdown around the opening of the wound. Subsequently is also unfortunately the case that the foam that was down at the sacral region and that was tracked up and along her back was not actually connected so they touched this obviously inhibits the ability to be able to properly drain fluid from 1 to the other the have to be continuous and touching. With  that being said there was some improvement today in the overall appearance of the wound bed though obviously size wise not much difference today. We are going to get in touch with the facility to try to make sure that they know what they are doing with applying the wound VAC I am also can likely see her for more frequent follow-up for the time being. All this was discussed with the patient's grandson during the office visit today as well. 08/11/19 upon evaluation today patient appears to be doing poorly in regard to her wound at this time. Unfortunately there appears to be infection at this time including blue-green drainage  and a poor quality to the granulation tissue all of which point toward infection she also seems to be much more painful than we noticed in the past. She is moving around a lot just with very light touch around the area I think that is an indication of discomfort 08/18/2019 upon evaluation today patient appears to be doing excellent in regard to her wound as compared to last evaluation. There is still some odor but she does not have as much of the necrotic tissue nor the significant drainage that I noted at the last visit. There still does appear to be evidence of infection and she still is much more tender than what she was experiencing prior to the infection noted last week. 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 I do believe that she likely is getting need to see infectious Chargois, [name] ([id number]) disease due to the ongoing nature of this infection. For now would you still have to hold off on reinitiating the wound VAC. 08/25/2019 upon evaluation today patient appears to be doing excellent with regard to the overall appearance of the wound though there is still is an odor and I do think that though the wound VAC would be best for her were not quite at the point of getting that on yet she actually has her appointment with infectious disease this afternoon it sounds like this will be with Dr. Sampson Goon. Fortunately there is no signs of systemic infection at this time. 09/01/2019 upon evaluation today patient appears to be doing excellent at this point in regard to her sacral wound. In fact I do not even notice any odor once everything was cleaned away and I came into the room to evaluate the patient the wound seems to be doing very well. There is excellent granulation, no necrotic tissue, and overall I think the Dakin's solution has done extremely well. I did talk with Dr. Sampson Goon who saw the patient in the interim earlier this week  and of note her lab work and everything else checked out just fine and Dr. Sampson Goon really was not concerned about the possibility of the need for IV antibiotic therapy. Obviously if anything worsens or changes in that may still be a consideration but right now she seems to actually be doing extremely well on all fronts. I think the Dakin's moistened gauze is actually a very good option for her to continue at this point. 09/15/2019 upon evaluation today patient appears to be doing well with regard to her wound in general. Fortunately there is no signs of significant infection overall I feel like she is doing excellent in that regard. With that being said I think the Dakin's moistened gauze packing has done extremely well for her 5/13; 2-week follow-up. The patient has a deep wound on  the lower sacral area however generally healthy looking surface over the bone here. There was no palpable bone no surrounding infection. I see that they have been using Dakin's moistened wet-to-dry. A wound VAC is apparently on hold. 10/20/2019 upon evaluation today patient's wound bed actually showed signs of good granulation and epithelization she seems to be doing excellent as far as the continuation of feeling of the wound is concerned there does not appear to be any significant slough or breakdown and no significant infection at this point. Overall I am extremely pleased with where things stand today. Her family member who is here today is very happy to hear this. 11/03/2019 upon evaluation today patient appears to be doing excellent at this point with regard to her wound in general although is not significantly smaller in size there appears to be a lot of maceration. I think the biggest issue here may be that she really needs to have more of a dressing to help control the drainage. I think possibly silver alginate with dry gauze and behind may be ideal for her. 11/17/2019 upon evaluation today patient's wound actually  appears to be doing quite well at this point. There is no evidence of active infection and overall I feel like this is doing quite nicely. We are still packing this with a silver alginate dressing. His granddaughter is present during evaluation today. 12/08/2019 upon evaluation today patient's wound actually appears to be doing quite well in regard to the overall appearance and progress at this point. There does not appear to be any signs of active infection we have been packing this with a silver alginate dressing which actually has done quite well for her to be honest. There is no evidence of worsening in general and in fact everything appears to be better especially in regard to depth. Electronic Signature(s) Signed: 12/08/2019 3:06:14 PM By: [name] Kelp PA-C Entered By: [name] [name] on 12/08/2019 15:06:14 [name] ([id number]) -------------------------------------------------------------------------------- Physical Exam Details Patient Name: [name] Lye A. Date of Service: 12/08/2019 12:30 PM Medical Record Number: [id number] Patient Account Number: [id number] Date of Birth/Sex: 04-28-1929 (86 y.o. F) Treating RN: [name] [name] Primary Care Provider: Dewaine [name] Other Clinician: Referring Provider: TATE, Katherina Right Treating Provider/Extender: STONE III, Shyloh Krinke Weeks in Treatment: 40 Constitutional Well-nourished and well-hydrated in no acute distress. Respiratory normal breathing without difficulty. Psychiatric this patient is able to make decisions and demonstrates good insight into disease process. Alert and Oriented x 3. pleasant and cooperative. Notes Upon inspection patient's wound bed actually showed signs of good granulation at this time there does not appear to be any evidence of active infection which is great and overall I think the packing this with silver alginate has done very well for the patient in general. I would recommend we continue as such. Electronic  Signature(s) Signed: 12/08/2019 3:06:34 PM By: [name] Kelp PA-C Entered By: [name] [name] on 12/08/2019 15:06:34 [name] ([id number]) -------------------------------------------------------------------------------- Physician Orders Details Patient Name: [name] Lye A. Date of Service: 12/08/2019 12:30 PM Medical Record Number: [id number] Patient Account Number: [id number] Date of Birth/Sex: 10/14/1928 (86 y.o. F) Treating RN: Tyler Aas Primary Care Provider: Dewaine [name] Other Clinician: Referring Provider: Dewaine [name] Treating Provider/Extender: [name] [name], Meiko Ives Weeks in Treatment: 40 Verbal / Phone Orders: No Diagnosis Coding Wound Cleansing Wound #4 Midline Sacrum o Clean wound with Normal Saline. o Cleanse wound with mild soap and water Primary Wound Dressing Wound #4 Midline Sacrum o Silver Alginate Secondary Dressing Wound #4  Midline Sacrum o Boardered Foam Dressing Dressing Change Frequency Wound #4 Midline Sacrum o Change dressing every day. Follow-up Appointments Wound #4 Midline Sacrum o Return Appointment in 3 weeks. Off-Loading Wound #4 Midline Sacrum o Roho cushion for wheelchair - pt needs Roho cushion for her chair o Mattress - Pt needs Air mattress o Turn and reposition every 2 hours - Pt is still getting pressure to her midline sacrum. Please reposition pt EVERY 2 HOURS Electronic Signature(s) Signed: 12/09/2019 2:35:58 PM By: [name] Kelp PA-C Signed: 12/13/2019 9:53:36 AM By: Tyler Aas Entered By: Tyler Aas on 12/08/2019 13:21:21 [name] [name] Kitchen ([id number]) -------------------------------------------------------------------------------- Problem List Details Patient Name: [name] Lye A. Date of Service: 12/08/2019 12:30 PM Medical Record Number: [id number] Patient Account Number: [id number] Date of Birth/Sex: November 03, 1928 (86 y.o. F) Treating RN: [name] [name] Primary Care Provider: Dewaine [name] Other  Clinician: Referring Provider: Dewaine [name] Treating Provider/Extender: [name] [name], Dalana Pfahler Weeks in Treatment: 40 Active Problems ICD-10 Encounter Code Description Active Date MDM Diagnosis L89.153 Pressure ulcer of sacral region, stage 3 03/24/2019 No Yes I10 Essential (primary) hypertension 03/03/2019 No Yes F01.50 Vascular dementia without behavioral disturbance 03/03/2019 No Yes Inactive Problems ICD-10 Code Description Active Date Inactive Date L89.623 Pressure ulcer of left heel, stage 3 03/03/2019 03/03/2019 L89.513 Pressure ulcer of right ankle, stage 3 03/03/2019 03/03/2019 L89.224 Pressure ulcer of left hip, stage 4 03/03/2019 03/03/2019 Resolved Problems Electronic Signature(s) Signed: 12/08/2019 1:53:51 PM By: [name] Kelp PA-C Entered By: [name] [name] on 12/08/2019 13:53:51 Griffy, [name] Stacks A. ([id number]) -------------------------------------------------------------------------------- Progress Note Details Patient Name: [name] Lye A. Date of Service: 12/08/2019 12:30 PM Medical Record Number: [id number] Patient Account Number: [id number] Date of Birth/Sex: 27-Dec-1928 (86 y.o. F) Treating RN: [name] [name] Primary Care Provider: Dewaine [name] Other Clinician: Referring Provider: Dewaine [name] Treating Provider/Extender: [name] [name], Cyrilla Durkin Weeks in Treatment: 40 Subjective Chief Complaint Information obtained from Patient Multiple pressure ulcers History of Present Illness (HPI) 03/03/2019 on evaluation today patient presents for initial evaluation here in the clinic today concerning issues that she has been having with wounds which are pressure in nature at multiple locations currently. This is on her left heel, right ankle, left hip/ischial location, and all her to varying degrees of severity and depth. The worst is on the left ischial location. At this time the patient has been tolerating dressing changes although I am not exactly sure the dressings that have been utilized  prior to coming in today. She does have some necrotic tissue noted at several locations this can require at least some degree of cleaning prior to application of dressings going forward. The patient does have dementia and unfortunately in recent months has become increasingly immobile requiring more significant treatment she was just recently moved roughly 2 weeks ago from assisted living to a skilled nursing facility at [facility]. Based on what I am seeing currently these pressure injuries occurred prior to that 2-week time I do not see any new or obvious pressure injury at this point. I discussed with the patient as well as her family member that this does appear to be doing better in my opinion than likely where things started. They are unsure as to whether or not she has an air mattress at the facility I do think that would be something that would be appropriate and good for her to have but at the moment I do not know for sure whether that is already in place if not that definitely is can be 1 of the  recommendations. Also think a Roho cushion for her wheelchair would be good as well as Prevalon offloading boots. The patient currently does not have any severe pain although when I was cleaning some of the areas she did note there was some discomfort at several of the locations. She does have a history of hypertension as well. 10/29; this is a patient from [facility] nursing home. She is nonambulatory. She is here for follow-up on 4 different wounds 1 over the left lateral malleolus, the right medial calcaneus, the left posterior hip and I believe a new area over the sacrum. The patient is nonambulatory but apparently eats well. 03/24/2019 on evaluation today patient actually appears to be doing better with regard to her wounds in general based on what I am seeing. Fortunately there is no signs of active infection at this time. No fever chills noted. Overall been very pleased with the  progress that seems to be occurring since I last saw her. There is a little bit of debridement That will need to be performed today. 04/07/2019 on evaluation today patient actually appears to be doing well in some regards based on what I am seeing today. Fortunately there is no signs of active infection at this time which is good news. No fever chills noted. With that being said the wounds on her foot and ankle region in general have healed at this point. I do not see anything open in that region. With regard to the other 2 wounds in the left ischium and midline sacral region I believe that we may need to make a dressing change at this point to do something a little bit more conducive to cleaning up the wound bed. The patient is in agreement with that plan today. 04/21/2019 on evaluation today patient actually appears to be doing well with regard to her wounds all things considered. Both appear to be much cleaner than what they were previous. Fortunately there is no signs of active infection at this time. Both I think are doing well with the current wound care measures which is the Dakin moistened gauze dressings. 05/05/2019 on evaluation today patient seems to be doing much better in regard to her ischial ulcer unfortunately she still seems to be getting pressure to the sacral region. I think that this is evidenced by the fact that she does have some surrounding deep tissue injury and the fact that the wound is actually measuring larger not better. Fortunately there is no evidence of active infection at this time I do believe the Dakin's is still the best option treatment wise but again I do think the patient needs more appropriate offloading on the sacral region. 06/24/2019 on evaluation today patient appears to be doing well with regard to the wound over the ischium which appears to be completely healed. This is great news. Fortunately there does appear to be some improvement with regard to the sacral  wound as well. There still does seem to be evidence of necrotic tissue noted minimally that I was able to gently clean away. With that being said the wound itself seems to be doing quite well which is good news. She seems to be offloaded appropriately in my opinion at this point. I think she may be ready for a wound VAC. 07/14/2019 on evaluation today patient appears to be doing well with regard to her sacral wound in general although unfortunately she does appear to have some evidence of potential infection there is some odor that seems to be  a little stronger than just what would normally be noted with the wound VAC. With that being said I do think we need to address this and though I think the wound VAC is appropriate to continue I think that we do need to see about a culture as well as placing her on some antibiotic therapy at this point. The patient's granddaughter was present during evaluation today as well. 07/28/2019 upon evaluation today patient unfortunately appears to not be doing quite as well as I would like with regard to the wound VAC and really was not applied appropriately. The phone was applied directly to the skin which did cause a little bit of breakdown around the opening of the wound. Subsequently is also unfortunately the case that the foam that was down at the sacral region and that was tracked up and along her back was not actually connected so they touched this obviously inhibits the ability to be able to properly drain fluid from 1 to the other the have to be continuous and touching. With that being said there was some improvement today in the overall appearance of the wound bed though obviously size wise not much difference today. We are going to get in touch with the facility to try to make sure that they know what they are doing with applying the wound VAC I am also can likely see her for more frequent follow-up for the time being. All this was discussed with the  patient's grandson during the office visit today as well. 08/11/19 upon evaluation today patient appears to be doing poorly in regard to her wound at this time. Unfortunately there appears to be infection at this time including blue-green drainage and a poor quality to the granulation tissue all of which point toward infection she also seems to be much more painful than we noticed in the past. She is moving around a lot just with very light touch around the area I think that is an indication of discomfort Norwood, [name] ([id number]) 08/18/2019 upon evaluation today patient appears to be doing excellent in regard to her wound as compared to last evaluation. There is still some odor but she does not have as much of the necrotic tissue nor the significant drainage that I noted at the last visit. There still does appear to be evidence of infection and she still is much more tender than what she was experiencing prior to the infection noted last week. 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 I do believe that she likely is getting need to see infectious disease due to the ongoing nature of this infection. For now would you still have to hold off on reinitiating the wound VAC. 08/25/2019 upon evaluation today patient appears to be doing excellent with regard to the overall appearance of the wound though there is still is an odor and I do think that though the wound VAC would be best for her were not quite at the point of getting that on yet she actually has her appointment with infectious disease this afternoon it sounds like this will be with Dr. Sampson Goon. Fortunately there is no signs of systemic infection at this time. 09/01/2019 upon evaluation today patient appears to be doing excellent at this point in regard to her sacral wound. In fact I do not even notice any odor once everything was cleaned away and I came into the room to evaluate the  patient the wound  seems to be doing very well. There is excellent granulation, no necrotic tissue, and overall I think the Dakin's solution has done extremely well. I did talk with Dr. Sampson Goon who saw the patient in the interim earlier this week and of note her lab work and everything else checked out just fine and Dr. Sampson Goon really was not concerned about the possibility of the need for IV antibiotic therapy. Obviously if anything worsens or changes in that may still be a consideration but right now she seems to actually be doing extremely well on all fronts. I think the Dakin's moistened gauze is actually a very good option for her to continue at this point. 09/15/2019 upon evaluation today patient appears to be doing well with regard to her wound in general. Fortunately there is no signs of significant infection overall I feel like she is doing excellent in that regard. With that being said I think the Dakin's moistened gauze packing has done extremely well for her 5/13; 2-week follow-up. The patient has a deep wound on the lower sacral area however generally healthy looking surface over the bone here. There was no palpable bone no surrounding infection. I see that they have been using Dakin's moistened wet-to-dry. A wound VAC is apparently on hold. 10/20/2019 upon evaluation today patient's wound bed actually showed signs of good granulation and epithelization she seems to be doing excellent as far as the continuation of feeling of the wound is concerned there does not appear to be any significant slough or breakdown and no significant infection at this point. Overall I am extremely pleased with where things stand today. Her family member who is here today is very happy to hear this. 11/03/2019 upon evaluation today patient appears to be doing excellent at this point with regard to her wound in general although is not significantly smaller in size there appears to be a lot of maceration. I  think the biggest issue here may be that she really needs to have more of a dressing to help control the drainage. I think possibly silver alginate with dry gauze and behind may be ideal for her. 11/17/2019 upon evaluation today patient's wound actually appears to be doing quite well at this point. There is no evidence of active infection and overall I feel like this is doing quite nicely. We are still packing this with a silver alginate dressing. His granddaughter is present during evaluation today. 12/08/2019 upon evaluation today patient's wound actually appears to be doing quite well in regard to the overall appearance and progress at this point. There does not appear to be any signs of active infection we have been packing this with a silver alginate dressing which actually has done quite well for her to be honest. There is no evidence of worsening in general and in fact everything appears to be better especially in regard to depth. Objective Constitutional Well-nourished and well-hydrated in no acute distress. Vitals Time Taken: 12:45 PM, Height: 67 in, Weight: 140 lbs, BMI: 21.9, Temperature: 98.4 F, Pulse: 88 bpm, Respiratory Rate: 14 breaths/min, Blood Pressure: 127/81 mmHg. Respiratory normal breathing without difficulty. Psychiatric this patient is able to make decisions and demonstrates good insight into disease process. Alert and Oriented x 3. pleasant and cooperative. General Notes: Upon inspection patient's wound bed actually showed signs of good granulation at this time there does not appear to be any evidence of active infection which is great and overall I think the packing this with silver alginate has done very well  for the patient in general. I would recommend we continue as such. Integumentary (Hair, Skin) Wound #4 status is Open. Original cause of wound was Pressure Injury. The wound is located on the Midline Sacrum. The wound measures 3cm length x 1.4cm width x 1cm  depth; 3.299cm^2 area and 3.299cm^3 volume. There is Fat Layer (Subcutaneous Tissue) Exposed exposed. There is no tunneling or undermining noted. There is a medium amount of serous drainage noted. The wound margin is epibole. There is large (67- 100%) red granulation within the wound bed. There is a small (1-33%) amount of necrotic tissue within the wound bed including Adherent Klump, [name] ([id number]) Slough. Assessment Active Problems ICD-10 Pressure ulcer of sacral region, stage 3 Essential (primary) hypertension Vascular dementia without behavioral disturbance Plan Wound Cleansing: Wound #4 Midline Sacrum: Clean wound with Normal Saline. Cleanse wound with mild soap and water Primary Wound Dressing: Wound #4 Midline Sacrum: Silver Alginate Secondary Dressing: Wound #4 Midline Sacrum: Boardered Foam Dressing Dressing Change Frequency: Wound #4 Midline Sacrum: Change dressing every day. Follow-up Appointments: Wound #4 Midline Sacrum: Return Appointment in 3 weeks. Off-Loading: Wound #4 Midline Sacrum: Roho cushion for wheelchair - pt needs Roho cushion for her chair Mattress - Pt needs Air mattress Turn and reposition every 2 hours - Pt is still getting pressure to her midline sacrum. Please reposition pt EVERY 2 HOURS 1. I would recommend currently that we go ahead and initiate a continuation of treatment with the silver alginate dressing packed into the wound bed I think that still appropriate. 2. I would also recommend that we go ahead and continue to monitor for infection though she seems to be doing well. I do not see any signs of active infection at this point. 3. I am also can recommend that we continue with appropriate offloading including repositioning, Roho cushion, and an air mattress. We will see patient back for reevaluation in 3 weeks here in the clinic. If anything worsens or changes patient will contact our office for additional recommendations. Electronic  Signature(s) Signed: 12/08/2019 3:07:09 PM By: [name] Kelp PA-C Entered By: [name] [name] on 12/08/2019 15:07:09 [name] [name], [name] [name] ([id number]) -------------------------------------------------------------------------------- SuperBill Details Patient Name: [name] Lye A. Date of Service: 12/08/2019 Medical Record Number: [id number] Patient Account Number: [id number] Date of Birth/Sex: Apr 06, 1929 (86 y.o. F) Treating RN: [name] [name] Primary Care Provider: Dewaine [name] Other Clinician: Referring Provider: TATE, Katherina Right Treating Provider/Extender: STONE III, Kason Benak Weeks in Treatment: 40 Diagnosis Coding ICD-10 Codes Code Description L89.153 Pressure ulcer of sacral region, stage 3 I10 Essential (primary) hypertension F01.50 Vascular dementia without behavioral disturbance Facility Procedures CPT4 Code: 08657846 Description: 99213 - WOUND CARE VISIT-LEV 3 EST PT Modifier: Quantity: 1 Physician Procedures CPT4 Code: 9629528 Description: 99214 - WC PHYS LEVEL 4 - EST PT Modifier: Quantity: 1 CPT4 Code: Description: ICD-10 Diagnosis Description L89.153 Pressure ulcer of sacral region, stage 3 I10 Essential (primary) hypertension F01.50 Vascular dementia without behavioral disturbance Modifier: Quantity: Electronic Signature(s) Signed: 12/08/2019 6:19:13 PM By: Elliot Gurney, BSN, RN, CWS, Kim RN, BSN Signed: 12/09/2019 2:35:58 PM By: [name] Kelp PA-C Previous Signature: 12/08/2019 3:07:27 PM Version By: [name] Kelp PA-C Entered By: Elliot Gurney, BSN, RN, CWS, Kim on 12/08/2019 18:19:13

## 2019-12-13 NOTE — Progress Notes (Signed)
NEKITA, PITA ([id number]) Visit Report for 12/08/2019 Arrival Information Details Patient Name: [name] [name], [name] Date of Service: 12/08/2019 12:30 PM Medical Record Number: [id number] Patient Account Number: [name] Date of Birth/Sex: 1928/08/16 (86 y.o. F) Treating RN: Tyler Aas Primary Care Thadd Apuzzo: Dewaine Oats Other Clinician: Referring Sapphire Tygart: Dewaine Oats Treating Makailyn Mccormick/Extender: Linwood Dibbles, HOYT Weeks in Treatment: 40 Visit Information History Since Last Visit Added or deleted any medications: No Patient Arrived: Wheel Chair Had a fall or experienced change in No Arrival Time: 12:43 activities of daily living that may affect Accompanied By: son risk of falls: Transfer Assistance: Nurse, adult Hospitalized since last visit: No Patient Identification Verified: Yes Pain Present Now: No Electronic Signature(s) Signed: 12/13/2019 9:53:36 AM By: Tyler Aas Entered By: Tyler Aas on 12/08/2019 12:45:33 Reasons, [name] [name] ([id number]) -------------------------------------------------------------------------------- Clinic Level of Care Assessment Details Patient Name: [name] Lye A. Date of Service: 12/08/2019 12:30 PM Medical Record Number: [id number] Patient Account Number: [name] Date of Birth/Sex: [birth year] (86 y.o. F) Treating RN: Huel Coventry Primary Care Rashun Grattan: Dewaine Oats Other Clinician: Referring Rosendo Couser: Dewaine Oats Treating Shantale Holtmeyer/Extender: Linwood Dibbles, HOYT Weeks in Treatment: 40 Clinic Level of Care Assessment Items TOOL 4 Quantity Score []  - Use when only an EandM is performed on FOLLOW-UP visit 0 ASSESSMENTS - Nursing Assessment / Reassessment X - Reassessment of Co-morbidities (includes updates in patient status) 1 10 X- 1 5 Reassessment of Adherence to Treatment Plan ASSESSMENTS - Wound and Skin Assessment / Reassessment X - Simple Wound Assessment / Reassessment - one wound 1 5 []  - 0 Complex Wound Assessment / Reassessment - multiple  wounds []  - 0 Dermatologic / Skin Assessment (not related to wound area) ASSESSMENTS - Focused Assessment []  - Circumferential Edema Measurements - multi extremities 0 []  - 0 Nutritional Assessment / Counseling / Intervention []  - 0 Lower Extremity Assessment (monofilament, tuning fork, pulses) []  - 0 Peripheral Arterial Disease Assessment (using hand held doppler) ASSESSMENTS - Ostomy and/or Continence Assessment and Care []  - Incontinence Assessment and Management 0 []  - 0 Ostomy Care Assessment and Management (repouching, etc.) PROCESS - Coordination of Care X - Simple Patient / Family Education for ongoing care 1 15 []  - 0 Complex (extensive) Patient / Family Education for ongoing care []  - 0 Staff obtains , Records, Test Results / Process Orders []  - 0 Staff telephones HHA, Nursing Homes / Clarify orders / etc []  - 0 Routine Transfer to another Facility (non-emergent condition) []  - 0 Routine Hospital Admission (non-emergent condition) []  - 0 New Admissions / / Ordering NPWT, Apligraf, etc. []  - 0 Emergency Hospital Admission (emergent condition) X- 1 10 Simple Discharge Coordination []  - 0 Complex (extensive) Discharge Coordination PROCESS - Special Needs []  - Pediatric / Minor Patient Management 0 []  - 0 Isolation Patient Management []  - 0 Hearing / Language / Visual special needs []  - 0 Assessment of Community assistance (transportation, D/C planning, etc.) []  - 0 Additional assistance / Altered mentation []  - 0 Support Surface(s) Assessment (bed, cushion, seat, etc.) INTERVENTIONS - Wound Cleansing / Measurement [name], [name] ( ) X- 1 5 Simple Wound Cleansing - one wound []  - 0 Complex Wound Cleansing - multiple wounds X- 1 5 Wound Imaging (photographs - any number of wounds) []  - 0 Wound Tracing (instead of photographs) X- 1 5 Simple Wound Measurement - one wound []  - 0 Complex Wound Measurement - multiple  wounds INTERVENTIONS - Wound Dressings []  - Small Wound Dressing one or multiple wounds 0  X- 1 15 Medium Wound Dressing one or multiple wounds []  - 0 Large Wound Dressing one or multiple wounds []  - 0 Application of Medications - topical []  - 0 Application of Medications - injection INTERVENTIONS - Miscellaneous []  - External ear exam 0 []  - 0 Specimen Collection (cultures, biopsies, blood, body fluids, etc.) []  - 0 Specimen(s) / Culture(s) sent or taken to Lab for analysis []  - 0 Patient Transfer (multiple staff / / Similar devices) []  - 0 Simple Staple / Suture removal (25 or less) []  - 0 Complex Staple / Suture removal (26 or more) []  - 0 Hypo / Hyperglycemic Management (close monitor of Blood Glucose) []  - 0 Ankle / Brachial Index (ABI) - do not check if billed separately X- 1 5 Vital Signs Has the patient been seen at the hospital within the last three years: Yes Total Score: 80 Level Of Care: New/Established - Level 3 Electronic Signature(s) Signed: 12/08/2019 6:25:29 PM By: , BSN, RN, CWS, Kim RN, BSN Entered By: , BSN, RN, CWS, Kim on 12/08/2019 18:19:02 , (Nurse, adult) -------------------------------------------------------------------------------- General Visit Notes Details Patient Name: A. Date of Service: 12/08/2019 12:30 PM Medical Record Number: Patient Account Number: Date of Birth/Sex: 04/05/1929 (86 y.o. F) Treating RN: Elliot Gurney Primary Care Elizer Bostic: 12/10/2019 Other Clinician: Referring Rolan Wrightsman: [name] Treating Sherrica Niehaus/Extender: [name] [name], HOYT Weeks in Treatment: 40 Notes Late entry: as patient was being placed back into her wheelchair with the hoyer lift, her pant leg on the left leg slid up. Patient has a skin tear with multiple steri-strips on the shin of the left leg. Nephew was with the patient, RN made him aware of the wound, he looked at the wound himself. He states he  had not been notified about this. He states he will discuss with SNF. Wound is stable. We we assess at patient's next wound care visit. Electronic Signature(s) Signed: 12/08/2019 6:23:31 PM By: [name] Lye, BSN, RN, CWS, Kim RN, BSN Entered By: 12/10/2019, BSN, RN, CWS, Kim on 12/08/2019 18:23:30 [name], 10/21/1928 (06-30-2000) -------------------------------------------------------------------------------- Lower Extremity Assessment Details Patient Name: Huel Coventry A. Date of Service: 12/08/2019 12:30 PM Medical Record Number: Dewaine Oats Patient Account Number: Linwood Dibbles Date of Birth/Sex: 07/13/1928 (86 y.o. F) Treating RN: Elliot Gurney Primary Care Kenlee Maler: 12/10/2019 Other Clinician: Referring Janeen Watson: [name] Treating Nyeemah Jennette/Extender: [name] [name], HOYT Weeks in Treatment: 40 Electronic Signature(s) Signed: 12/13/2019 9:53:36 AM By: [name] Lye Entered By: 12/10/2019 on 12/08/2019 13:03:33 [name] [name], [name] (10/21/1928) -------------------------------------------------------------------------------- Multi Wound Chart Details Patient Name: 06-30-2000 A. Date of Service: 12/08/2019 12:30 PM Medical Record Number: Dewaine Oats Patient Account Number: Dewaine Oats Date of Birth/Sex: 06-25-1928 (86 y.o. F) Treating RN: Tyler Aas Primary Care Melquisedec Journey: Tyler Aas Other Clinician: Referring Levie Wages: 12/10/2019 Treating Hartleigh Edmonston/Extender: Marylouise Stacks, HOYT Weeks in Treatment: 40 Vital Signs Height(in): 67 Pulse(bpm): 88 Weight(lbs): 140 Blood Pressure(mmHg): 127/81 Body Mass Index(BMI): 22 Temperature(F): 98.4 Respiratory Rate(breaths/min): 14 Photos: [4:No Photos] [N/A:N/A] Wound Location: [4:Midline Sacrum] [N/A:N/A] Wounding Event: [4:Pressure Injury] [N/A:N/A] Primary Etiology: [4:Pressure Ulcer] [N/A:N/A] Comorbid History: [4:Glaucoma, Hypertension, Osteoarthritis, Dementia] [N/A:N/A] Date Acquired: [4:07/18/2018] [N/A:N/A] Weeks of Treatment: [4:40] [N/A:N/A] Wound  Status: [4:Open] [N/A:N/A] Clustered Wound: [4:Yes] [N/A:N/A] Clustered Quantity: [4:1] [N/A:N/A] Measurements L x W x D (cm) [4:3x1.4x1] [N/A:N/A] Area (cm) : [4:3.299] [N/A:N/A] Volume (cm) : [4:3.299] [N/A:N/A] % Reduction in Area: [4:80.90%] [N/A:N/A] % Reduction in Volume: [4:-90.90%] [N/A:N/A] Classification: [4:Category/Stage IV] [N/A:N/A] Exudate Amount: [4:Medium] [N/A:N/A] Exudate Type: [4:Serous] [N/A:N/A] Exudate  Color: [4:amber] [N/A:N/A] Wound Margin: [4:Epibole] [N/A:N/A] Granulation Amount: [4:Large (67-100%)] [N/A:N/A] Granulation Quality: [4:Red] [N/A:N/A] Necrotic Amount: [4:Small (1-33%)] [N/A:N/A] Exposed Structures: [4:Fat Layer (Subcutaneous Tissue) Exposed: Yes Fascia: No Tendon: No Muscle: No Joint: No Bone: No Small (1-33%)] [N/A:N/A N/A] Treatment Notes Electronic Signature(s) Signed: 12/13/2019 9:53:36 AM By: Tyler Aas Entered By: Tyler Aas on 12/08/2019 13:10:27 [name] Lye A. ([id number]) -------------------------------------------------------------------------------- Multi-Disciplinary Care Plan Details Patient Name: [name] Lye A. Date of Service: 12/08/2019 12:30 PM Medical Record Number: [id number] Patient Account Number: [name] Date of Birth/Sex: October 24, 1928 (86 y.o. F) Treating RN: Tyler Aas Primary Care Ahjanae Cassel: Dewaine Oats Other Clinician: Referring Keoni Havey: Dewaine Oats Treating Ariyannah Pauling/Extender: Linwood Dibbles, HOYT Weeks in Treatment: 40 Active Inactive Abuse / Safety / Falls / Self Care Management Nursing Diagnoses: Potential for falls Goals: Patient will remain injury free related to falls Date Initiated: 03/03/2019 Target Resolution Date: 03/16/2019 Goal Status: Active Interventions: Assess fall risk on admission and as needed Notes: Medication Nursing Diagnoses: Knowledge deficit related to medication safety: actual or potential Goals: Patient/caregiver will demonstrate understanding of all current  medications Date Initiated: 03/03/2019 Target Resolution Date: 03/16/2019 Goal Status: Active Interventions: Assess for medication contraindications each visit where new medications are prescribed Treatment Activities: New medication prescribed at Wound Center : 03/03/2019 Notes: Necrotic Tissue Nursing Diagnoses: Impaired tissue integrity related to necrotic/devitalized tissue Goals: Necrotic/devitalized tissue will be minimized in the wound bed Date Initiated: 03/03/2019 Target Resolution Date: 03/16/2019 Goal Status: Active Interventions: Assess patient pain level pre-, during and post procedure and prior to discharge Treatment Activities: Apply topical anesthetic as ordered : 03/03/2019 Notes: Nutrition Nursing Diagnoses: Potential for alteratiion in Nutrition/Potential for imbalanced nutrition Heyne, Zylpha A. ([id number]) Goals: Patient/caregiver agrees to and verbalizes understanding of need to use nutritional supplements and/or vitamins as prescribed Date Initiated: 03/03/2019 Target Resolution Date: 03/16/2019 Goal Status: Active Interventions: Provide education on nutrition Notes: Pressure Nursing Diagnoses: Knowledge deficit related to management of pressures ulcers Goals: Patient/caregiver will verbalize understanding of pressure ulcer management Date Initiated: 03/03/2019 Target Resolution Date: 03/16/2019 Goal Status: Active Interventions: Assess: immobility, friction, shearing, incontinence upon admission and as needed Provide education on pressure ulcers Treatment Activities: Patient referred for home evaluation of offloading devices/mattresses : 03/03/2019 Patient referred for pressure reduction/relief devices : 03/03/2019 Pressure reduction/relief device ordered : 03/03/2019 Notes: Wound/Skin Impairment Nursing Diagnoses: Impaired tissue integrity Goals: Ulcer/skin breakdown will have a volume reduction of 30% by week 4 Date Initiated:  03/03/2019 Target Resolution Date: 03/03/2019 Goal Status: Active Interventions: Assess ulceration(s) every visit Treatment Activities: Skin care regimen initiated : 03/03/2019 Notes: Electronic Signature(s) Signed: 12/13/2019 9:53:36 AM By: Tyler Aas Entered By: Tyler Aas on 12/08/2019 13:10:17 [name] Lye A. ([id number]) -------------------------------------------------------------------------------- Pain Assessment Details Patient Name: [name] Lye A. Date of Service: 12/08/2019 12:30 PM Medical Record Number: [id number] Patient Account Number: [name] Date of Birth/Sex: November 30, 1928 (86 y.o. F) Treating RN: Tyler Aas Primary Care Lorean Ekstrand: Dewaine Oats Other Clinician: Referring Hennessey Cantrell: Dewaine Oats Treating Tarris Delbene/Extender: Linwood Dibbles, HOYT Weeks in Treatment: 40 Active Problems Location of Pain Severity and Description of Pain Patient Has Paino No Site Locations Pain Management and Medication Current Pain Management: Electronic Signature(s) Signed: 12/13/2019 9:53:36 AM By: Tyler Aas Entered By: Tyler Aas on 12/08/2019 12:48:36 Sharber, [name] ([id number]) -------------------------------------------------------------------------------- Wound Assessment Details Patient Name: [name] Lye A. Date of Service: 12/08/2019 12:30 PM Medical Record Number: [id number] Patient Account Number: [name] Date of Birth/Sex: Mar 26, 1929 (86 y.o. F) Treating RN: Tyler Aas Primary Care Vernadette Stutsman: Dewaine Oats Other Clinician: Referring  Laylah Riga: Dewaine OatsATE, DENNY Treating Haizlee Henton/Extender: STONE III, HOYT Weeks in Treatment: 40 Wound Status Wound Number: 4 Primary Etiology: Pressure Ulcer Wound Location: Midline Sacrum Wound Status: Open Wounding Event: Pressure Injury Comorbid History: Glaucoma, Hypertension, Osteoarthritis, Dementia Date Acquired: 07/18/2018 Weeks Of Treatment: 40 Clustered Wound: Yes Wound Measurements Length: (cm) 3 Width: (cm)  1.4 Depth: (cm) 1 Clustered Quantity: 1 Area: (cm) 3.299 Volume: (cm) 3.299 % Reduction in Area: 80.9% % Reduction in Volume: -90.9% Epithelialization: Small (1-33%) Tunneling: No Undermining: No Wound Description Classification: Category/Stage IV Wound Margin: Epibole Exudate Amount: Medium Exudate Type: Serous Exudate Color: amber Foul Odor After Cleansing: No Slough/Fibrino Yes Wound Bed Granulation Amount: Large (67-100%) Exposed Structure Granulation Quality: Red Fascia Exposed: No Necrotic Amount: Small (1-33%) Fat Layer (Subcutaneous Tissue) Exposed: Yes Necrotic Quality: Adherent Slough Tendon Exposed: No Muscle Exposed: No Joint Exposed: No Bone Exposed: No Electronic Signature(s) Signed: 12/13/2019 9:53:36 AM By: Tyler AasButler, Michelle Entered By: Tyler AasButler, Michelle on 12/08/2019 13:03:15 Dargis, Marylouise StacksWILMA A. ([id number]) -------------------------------------------------------------------------------- Vitals Details Patient Name: [name] LyeHALL, Lashana A. Date of Service: 12/08/2019 12:30 PM Medical Record Number: [id number] Patient Account Number: [id number] Date of Birth/Sex: 02/21/1929 (86 y.o. F) Treating RN: Tyler AasButler, Michelle Primary Care Ghalia Reicks: Dewaine OatsATE, DENNY Other Clinician: Referring Emyah Roznowski: Dewaine OatsATE, DENNY Treating Katia Hannen/Extender: Linwood DibblesSTONE III, HOYT Weeks in Treatment: 40 Vital Signs Time Taken: 12:45 Temperature (F): 98.4 Height (in): 67 Pulse (bpm): 88 Weight (lbs): 140 Respiratory Rate (breaths/min): 14 Body Mass Index (BMI): 21.9 Blood Pressure (mmHg): 127/81 Reference Range: 80 - 120 mg / dl Electronic Signature(s) Signed: 12/13/2019 9:53:36 AM By: Tyler AasButler, Michelle Entered By: Tyler AasButler, Michelle on 12/08/2019 12:46:56

## 2019-12-29 ENCOUNTER — Encounter: Payer: Medicare Other | Attending: Physician Assistant | Admitting: Physician Assistant

## 2019-12-29 DIAGNOSIS — F0150 Vascular dementia without behavioral disturbance: Secondary | ICD-10-CM | POA: Diagnosis not present

## 2019-12-29 DIAGNOSIS — L89623 Pressure ulcer of left heel, stage 3: Secondary | ICD-10-CM | POA: Diagnosis not present

## 2019-12-29 DIAGNOSIS — R54 Age-related physical debility: Secondary | ICD-10-CM | POA: Insufficient documentation

## 2019-12-29 DIAGNOSIS — I10 Essential (primary) hypertension: Secondary | ICD-10-CM | POA: Insufficient documentation

## 2019-12-29 DIAGNOSIS — L89513 Pressure ulcer of right ankle, stage 3: Secondary | ICD-10-CM | POA: Insufficient documentation

## 2019-12-29 DIAGNOSIS — M1990 Unspecified osteoarthritis, unspecified site: Secondary | ICD-10-CM | POA: Insufficient documentation

## 2019-12-29 DIAGNOSIS — L89224 Pressure ulcer of left hip, stage 4: Secondary | ICD-10-CM | POA: Diagnosis not present

## 2019-12-29 DIAGNOSIS — L89153 Pressure ulcer of sacral region, stage 3: Secondary | ICD-10-CM | POA: Insufficient documentation

## 2019-12-29 NOTE — Progress Notes (Addendum)
Frederick PeersHALL, Chera A. ([id number]) Visit Report for 12/29/2019 Chief Complaint Document Details Patient Name: [name] [name], Kristiane A. Date of Service: 12/29/2019 11:00 AM Medical Record Number: [id number] Patient Account Number: [id number] Date of Birth/Sex: 07/09/1928 (86 y.o. F) Treating RN: Rodell PernaScott, Dajea Primary Care Provider: Dewaine OatsATE, DENNY Other Clinician: Referring Provider: Dewaine OatsATE, DENNY Treating Provider/Extender: Linwood DibblesSTONE III, Pierrette Scheu Weeks in Treatment: 4543 Information Obtained from: Patient Chief Complaint Multiple pressure ulcers Electronic Signature(s) Signed: 12/29/2019 11:15:31 AM By: Lenda KelpStone III, Season Astacio PA-C Entered By: Lenda KelpStone III, Nyrie Sigal on 12/29/2019 11:15:31 Margo AyeHALL, Marylouise StacksWILMA A. ([id number]) -------------------------------------------------------------------------------- HPI Details Patient Name: [name] [name], Aimee A. Date of Service: 12/29/2019 11:00 AM Medical Record Number: [id number] Patient Account Number: [id number] Date of Birth/Sex: 10/29/1928 (86 y.o. F) Treating RN: Rodell PernaScott, Dajea Primary Care Provider: Dewaine OatsATE, DENNY Other Clinician: Referring Provider: Dewaine OatsATE, DENNY Treating Provider/Extender: Linwood DibblesSTONE III, Yuval Nolet Weeks in Treatment: 7843 History of Present Illness HPI Description: 03/03/2019 on evaluation today patient presents for initial evaluation here in the clinic today concerning issues that she has been having with wounds which are pressure in nature at multiple locations currently. This is on her left heel, right ankle, left hip/ischial location, and all her to varying degrees of severity and depth. The worst is on the left ischial location. At this time the patient has been tolerating dressing changes although I am not exactly sure the dressings that have been utilized prior to coming in today. She does have some necrotic tissue noted at several locations this can require at least some degree of cleaning prior to application of dressings going forward. The patient does have dementia and unfortunately in recent  months has become increasingly immobile requiring more significant treatment she was just recently moved roughly 2 weeks ago from assisted living to a skilled nursing facility at [facility]. Based on what I am seeing currently these pressure injuries occurred prior to that 2-week time I do not see any new or obvious pressure injury at this point. I discussed with the patient as well as her family member that this does appear to be doing better in my opinion than likely where things started. They are unsure as to whether or not she has an air mattress at the facility I do think that would be something that would be appropriate and good for her to have but at the moment I do not know for sure whether that is already in place if not that definitely is can be 1 of the recommendations. Also think a Roho cushion for her wheelchair would be good as well as Prevalon offloading boots. The patient currently does not have any severe pain although when I was cleaning some of the areas she did note there was some discomfort at several of the locations. She does have a history of hypertension as well. 10/29; this is a patient from [facility] nursing home. She is nonambulatory. She is here for follow-up on 4 different wounds 1 over the left lateral malleolus, the right medial calcaneus, the left posterior hip and I believe a new area over the sacrum. The patient is nonambulatory but apparently eats well. 03/24/2019 on evaluation today patient actually appears to be doing better with regard to her wounds in general based on what I am seeing. Fortunately there is no signs of active infection at this time. No fever chills noted. Overall been very pleased with the progress that seems to be occurring since I last saw her. There is a little bit of debridement That will need to be performed  today. 04/07/2019 on evaluation today patient actually appears to be doing well in some regards based on what I am seeing  today. Fortunately there is no signs of active infection at this time which is good news. No fever chills noted. With that being said the wounds on her foot and ankle region in general have healed at this point. I do not see anything open in that region. With regard to the other 2 wounds in the left ischium and midline sacral region I believe that we may need to make a dressing change at this point to do something a little bit more conducive to cleaning up the wound bed. The patient is in agreement with that plan today. 04/21/2019 on evaluation today patient actually appears to be doing well with regard to her wounds all things considered. Both appear to be much cleaner than what they were previous. Fortunately there is no signs of active infection at this time. Both I think are doing well with the current wound care measures which is the Dakin moistened gauze dressings. 05/05/2019 on evaluation today patient seems to be doing much better in regard to her ischial ulcer unfortunately she still seems to be getting pressure to the sacral region. I think that this is evidenced by the fact that she does have some surrounding deep tissue injury and the fact that the wound is actually measuring larger not better. Fortunately there is no evidence of active infection at this time I do believe the Dakin's is still the best option treatment wise but again I do think the patient needs more appropriate offloading on the sacral region. 06/24/2019 on evaluation today patient appears to be doing well with regard to the wound over the ischium which appears to be completely healed. This is great news. Fortunately there does appear to be some improvement with regard to the sacral wound as well. There still does seem to be evidence of necrotic tissue noted minimally that I was able to gently clean away. With that being said the wound itself seems to be doing quite well which is good news. She seems to be offloaded  appropriately in my opinion at this point. I think she may be ready for a wound VAC. 07/14/2019 on evaluation today patient appears to be doing well with regard to her sacral wound in general although unfortunately she does appear to have some evidence of potential infection there is some odor that seems to be a little stronger than just what would normally be noted with the wound VAC. With that being said I do think we need to address this and though I think the wound VAC is appropriate to continue I think that we do need to see about a culture as well as placing her on some antibiotic therapy at this point. The patient's granddaughter was present during evaluation today as well. 07/28/2019 upon evaluation today patient unfortunately appears to not be doing quite as well as I would like with regard to the wound VAC and really was not applied appropriately. The phone was applied directly to the skin which did cause a little bit of breakdown around the opening of the wound. Subsequently is also unfortunately the case that the foam that was down at the sacral region and that was tracked up and along her back was not actually connected so they touched this obviously inhibits the ability to be able to properly drain fluid from 1 to the other the have to be continuous and touching. With  that being said there was some improvement today in the overall appearance of the wound bed though obviously size wise not much difference today. We are going to get in touch with the facility to try to make sure that they know what they are doing with applying the wound VAC I am also can likely see her for more frequent follow-up for the time being. All this was discussed with the patient's grandson during the office visit today as well. 08/11/19 upon evaluation today patient appears to be doing poorly in regard to her wound at this time. Unfortunately there appears to be infection at this time including blue-green drainage  and a poor quality to the granulation tissue all of which point toward infection she also seems to be much more painful than we noticed in the past. She is moving around a lot just with very light touch around the area I think that is an indication of discomfort 08/18/2019 upon evaluation today patient appears to be doing excellent in regard to her wound as compared to last evaluation. There is still some odor but she does not have as much of the necrotic tissue nor the significant drainage that I noted at the last visit. There still does appear to be evidence of infection and she still is much more tender than what she was experiencing prior to the infection noted last week. 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 I do believe that she likely is getting need to see infectious Syler, Lois A. ([id number]) disease due to the ongoing nature of this infection. For now would you still have to hold off on reinitiating the wound VAC. 08/25/2019 upon evaluation today patient appears to be doing excellent with regard to the overall appearance of the wound though there is still is an odor and I do think that though the wound VAC would be best for her were not quite at the point of getting that on yet she actually has her appointment with infectious disease this afternoon it sounds like this will be with Dr. Sampson Goon. Fortunately there is no signs of systemic infection at this time. 09/01/2019 upon evaluation today patient appears to be doing excellent at this point in regard to her sacral wound. In fact I do not even notice any odor once everything was cleaned away and I came into the room to evaluate the patient the wound seems to be doing very well. There is excellent granulation, no necrotic tissue, and overall I think the Dakin's solution has done extremely well. I did talk with Dr. Sampson Goon who saw the patient in the interim earlier this week  and of note her lab work and everything else checked out just fine and Dr. Sampson Goon really was not concerned about the possibility of the need for IV antibiotic therapy. Obviously if anything worsens or changes in that may still be a consideration but right now she seems to actually be doing extremely well on all fronts. I think the Dakin's moistened gauze is actually a very good option for her to continue at this point. 09/15/2019 upon evaluation today patient appears to be doing well with regard to her wound in general. Fortunately there is no signs of significant infection overall I feel like she is doing excellent in that regard. With that being said I think the Dakin's moistened gauze packing has done extremely well for her 5/13; 2-week follow-up. The patient has a deep wound on  the lower sacral area however generally healthy looking surface over the bone here. There was no palpable bone no surrounding infection. I see that they have been using Dakin's moistened wet-to-dry. A wound VAC is apparently on hold. 10/20/2019 upon evaluation today patient's wound bed actually showed signs of good granulation and epithelization she seems to be doing excellent as far as the continuation of feeling of the wound is concerned there does not appear to be any significant slough or breakdown and no significant infection at this point. Overall I am extremely pleased with where things stand today. Her family member who is here today is very happy to hear this. 11/03/2019 upon evaluation today patient appears to be doing excellent at this point with regard to her wound in general although is not significantly smaller in size there appears to be a lot of maceration. I think the biggest issue here may be that she really needs to have more of a dressing to help control the drainage. I think possibly silver alginate with dry gauze and behind may be ideal for her. 11/17/2019 upon evaluation today patient's wound actually  appears to be doing quite well at this point. There is no evidence of active infection and overall I feel like this is doing quite nicely. We are still packing this with a silver alginate dressing. His granddaughter is present during evaluation today. 12/08/2019 upon evaluation today patient's wound actually appears to be doing quite well in regard to the overall appearance and progress at this point. There does not appear to be any signs of active infection we have been packing this with a silver alginate dressing which actually has done quite well for her to be honest. There is no evidence of worsening in general and in fact everything appears to be better especially in regard to depth. 12/19/2019 upon evaluation today patient's wound actually appears to be showing signs of good improvement. Fortunately there is no signs of active infection and overall she is doing quite well. No fevers, chills, nausea, vomiting, or diarrhea. Electronic Signature(s) Signed: 12/29/2019 1:11:56 PM By: Lenda Kelp PA-C Entered By: Lenda Kelp on 12/29/2019 13:11:56 [name], [name] ([id number]) -------------------------------------------------------------------------------- Physical Exam Details Patient Name: [name] Lye A. Date of Service: 12/29/2019 11:00 AM Medical Record Number: [id number] Patient Account Number: [id number] Date of Birth/Sex: 02/14/1929 (86 y.o. F) Treating RN: Rodell Perna Primary Care Provider: Dewaine Oats Other Clinician: Referring Provider: TATE, Katherina Right Treating Provider/Extender: STONE III, Octavious Zidek Weeks in Treatment: 67 Constitutional Well-nourished and well-hydrated in no acute distress. Respiratory normal breathing without difficulty. Psychiatric Patient is not able to cooperate in decision making regarding care. Patient has dementia. pleasant and cooperative. Notes Patient's wound bed actually showed signs of good granulation at this time and fortunately there is no evidence of  active infection which is great news overall feel like the alginate is doing a good job we will get a recommend we continue for the time being. Electronic Signature(s) Signed: 12/29/2019 1:12:17 PM By: Lenda Kelp PA-C Entered By: Lenda Kelp on 12/29/2019 13:12:17 [name], [name] ([id number]) -------------------------------------------------------------------------------- Physician Orders Details Patient Name: [name] Lye A. Date of Service: 12/29/2019 11:00 AM Medical Record Number: [id number] Patient Account Number: [id number] Date of Birth/Sex: 02/23/1929 (86 y.o. F) Treating RN: Rodell Perna Primary Care Provider: Dewaine Oats Other Clinician: Referring Provider: Dewaine Oats Treating Provider/Extender: Linwood Dibbles, Conard Alvira Weeks in Treatment: 30 Verbal / Phone Orders: No Diagnosis Coding ICD-10 Coding Code Description L89.153 Pressure ulcer of sacral region,  stage 3 I10 Essential (primary) hypertension F01.50 Vascular dementia without behavioral disturbance Wound Cleansing Wound #4 Midline Sacrum o Clean wound with Normal Saline. o Cleanse wound with mild soap and water Primary Wound Dressing Wound #4 Midline Sacrum o Silver Alginate Secondary Dressing Wound #4 Midline Sacrum o ABD pad o Dry Gauze - to fill space behind silver alginate Dressing Change Frequency Wound #4 Midline Sacrum o Change dressing every day. Follow-up Appointments Wound #4 Midline Sacrum o Return Appointment in 3 weeks. Off-Loading Wound #4 Midline Sacrum o Roho cushion for wheelchair - pt needs Roho cushion for her chair o Mattress - Pt needs Air mattress o Turn and reposition every 2 hours - Pt is still getting pressure to her midline sacrum. Please reposition pt EVERY 2 HOURS Electronic Signature(s) Signed: 12/29/2019 11:58:48 AM By: Rodell Perna Signed: 12/30/2019 5:00:44 PM By: Lenda Kelp PA-C Entered By: Rodell Perna on 12/29/2019 11:58:47 Hoiland, [name]  ([id number]) -------------------------------------------------------------------------------- Problem List Details Patient Name: [name] Lye A. Date of Service: 12/29/2019 11:00 AM Medical Record Number: [id number] Patient Account Number: [id number] Date of Birth/Sex: October 24, 1928 (86 y.o. F) Treating RN: Rodell Perna Primary Care Provider: Dewaine Oats Other Clinician: Referring Provider: Dewaine Oats Treating Provider/Extender: Linwood Dibbles, Caydance Kuehnle Weeks in Treatment: 96 Active Problems ICD-10 Encounter Code Description Active Date MDM Diagnosis L89.153 Pressure ulcer of sacral region, stage 3 03/24/2019 No Yes I10 Essential (primary) hypertension 03/03/2019 No Yes F01.50 Vascular dementia without behavioral disturbance 03/03/2019 No Yes Inactive Problems ICD-10 Code Description Active Date Inactive Date L89.623 Pressure ulcer of left heel, stage 3 03/03/2019 03/03/2019 L89.513 Pressure ulcer of right ankle, stage 3 03/03/2019 03/03/2019 L89.224 Pressure ulcer of left hip, stage 4 03/03/2019 03/03/2019 Resolved Problems Electronic Signature(s) Signed: 12/29/2019 11:15:21 AM By: Lenda Kelp PA-C Entered By: Lenda Kelp on 12/29/2019 11:15:20 Clucas, [name] ([id number]) -------------------------------------------------------------------------------- Progress Note Details Patient Name: [name] Lye A. Date of Service: 12/29/2019 11:00 AM Medical Record Number: [id number] Patient Account Number: [id number] Date of Birth/Sex: 12-07-1928 (86 y.o. F) Treating RN: Rodell Perna Primary Care Provider: Dewaine Oats Other Clinician: Referring Provider: Dewaine Oats Treating Provider/Extender: Linwood Dibbles, Kelley Knoth Weeks in Treatment: 43 Subjective Chief Complaint Information obtained from Patient Multiple pressure ulcers History of Present Illness (HPI) 03/03/2019 on evaluation today patient presents for initial evaluation here in the clinic today concerning issues that she has been having  with wounds which are pressure in nature at multiple locations currently. This is on her left heel, right ankle, left hip/ischial location, and all her to varying degrees of severity and depth. The worst is on the left ischial location. At this time the patient has been tolerating dressing changes although I am not exactly sure the dressings that have been utilized prior to coming in today. She does have some necrotic tissue noted at several locations this can require at least some degree of cleaning prior to application of dressings going forward. The patient does have dementia and unfortunately in recent months has become increasingly immobile requiring more significant treatment she was just recently moved roughly 2 weeks ago from assisted living to a skilled nursing facility at [facility]. Based on what I am seeing currently these pressure injuries occurred prior to that 2-week time I do not see any new or obvious pressure injury at this point. I discussed with the patient as well as her family member that this does appear to be doing better in my opinion than likely where things started. They are unsure as  to whether or not she has an air mattress at the facility I do think that would be something that would be appropriate and good for her to have but at the moment I do not know for sure whether that is already in place if not that definitely is can be 1 of the recommendations. Also think a Roho cushion for her wheelchair would be good as well as Prevalon offloading boots. The patient currently does not have any severe pain although when I was cleaning some of the areas she did note there was some discomfort at several of the locations. She does have a history of hypertension as well. 10/29; this is a patient from [facility] nursing home. She is nonambulatory. She is here for follow-up on 4 different wounds 1 over the left lateral malleolus, the right medial calcaneus, the left posterior  hip and I believe a new area over the sacrum. The patient is nonambulatory but apparently eats well. 03/24/2019 on evaluation today patient actually appears to be doing better with regard to her wounds in general based on what I am seeing. Fortunately there is no signs of active infection at this time. No fever chills noted. Overall been very pleased with the progress that seems to be occurring since I last saw her. There is a little bit of debridement That will need to be performed today. 04/07/2019 on evaluation today patient actually appears to be doing well in some regards based on what I am seeing today. Fortunately there is no signs of active infection at this time which is good news. No fever chills noted. With that being said the wounds on her foot and ankle region in general have healed at this point. I do not see anything open in that region. With regard to the other 2 wounds in the left ischium and midline sacral region I believe that we may need to make a dressing change at this point to do something a little bit more conducive to cleaning up the wound bed. The patient is in agreement with that plan today. 04/21/2019 on evaluation today patient actually appears to be doing well with regard to her wounds all things considered. Both appear to be much cleaner than what they were previous. Fortunately there is no signs of active infection at this time. Both I think are doing well with the current wound care measures which is the Dakin moistened gauze dressings. 05/05/2019 on evaluation today patient seems to be doing much better in regard to her ischial ulcer unfortunately she still seems to be getting pressure to the sacral region. I think that this is evidenced by the fact that she does have some surrounding deep tissue injury and the fact that the wound is actually measuring larger not better. Fortunately there is no evidence of active infection at this time I do believe the Dakin's is  still the best option treatment wise but again I do think the patient needs more appropriate offloading on the sacral region. 06/24/2019 on evaluation today patient appears to be doing well with regard to the wound over the ischium which appears to be completely healed. This is great news. Fortunately there does appear to be some improvement with regard to the sacral wound as well. There still does seem to be evidence of necrotic tissue noted minimally that I was able to gently clean away. With that being said the wound itself seems to be doing quite well which is good news. She seems to be offloaded  appropriately in my opinion at this point. I think she may be ready for a wound VAC. 07/14/2019 on evaluation today patient appears to be doing well with regard to her sacral wound in general although unfortunately she does appear to have some evidence of potential infection there is some odor that seems to be a little stronger than just what would normally be noted with the wound VAC. With that being said I do think we need to address this and though I think the wound VAC is appropriate to continue I think that we do need to see about a culture as well as placing her on some antibiotic therapy at this point. The patient's granddaughter was present during evaluation today as well. 07/28/2019 upon evaluation today patient unfortunately appears to not be doing quite as well as I would like with regard to the wound VAC and really was not applied appropriately. The phone was applied directly to the skin which did cause a little bit of breakdown around the opening of the wound. Subsequently is also unfortunately the case that the foam that was down at the sacral region and that was tracked up and along her back was not actually connected so they touched this obviously inhibits the ability to be able to properly drain fluid from 1 to the other the have to be continuous and touching. With that being said there was  some improvement today in the overall appearance of the wound bed though obviously size wise not much difference today. We are going to get in touch with the facility to try to make sure that they know what they are doing with applying the wound VAC I am also can likely see her for more frequent follow-up for the time being. All this was discussed with the patient's grandson during the office visit today as well. 08/11/19 upon evaluation today patient appears to be doing poorly in regard to her wound at this time. Unfortunately there appears to be infection at this time including blue-green drainage and a poor quality to the granulation tissue all of which point toward infection she also seems to be much more painful than we noticed in the past. She is moving around a lot just with very light touch around the area I think that is an indication of discomfort Armistead, Riah A. ([id number]) 08/18/2019 upon evaluation today patient appears to be doing excellent in regard to her wound as compared to last evaluation. There is still some odor but she does not have as much of the necrotic tissue nor the significant drainage that I noted at the last visit. There still does appear to be evidence of infection and she still is much more tender than what she was experiencing prior to the infection noted last week. 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 I do believe that she likely is getting need to see infectious disease due to the ongoing nature of this infection. For now would you still have to hold off on reinitiating the wound VAC. 08/25/2019 upon evaluation today patient appears to be doing excellent with regard to the overall appearance of the wound though there is still is an odor and I do think that though the wound VAC would be best for her were not quite at the point of getting that on yet she actually has her appointment with infectious disease  this afternoon it sounds like this will be with Dr. Sampson Goon. Fortunately  there is no signs of systemic infection at this time. 09/01/2019 upon evaluation today patient appears to be doing excellent at this point in regard to her sacral wound. In fact I do not even notice any odor once everything was cleaned away and I came into the room to evaluate the patient the wound seems to be doing very well. There is excellent granulation, no necrotic tissue, and overall I think the Dakin's solution has done extremely well. I did talk with Dr. Sampson Goon who saw the patient in the interim earlier this week and of note her lab work and everything else checked out just fine and Dr. Sampson Goon really was not concerned about the possibility of the need for IV antibiotic therapy. Obviously if anything worsens or changes in that may still be a consideration but right now she seems to actually be doing extremely well on all fronts. I think the Dakin's moistened gauze is actually a very good option for her to continue at this point. 09/15/2019 upon evaluation today patient appears to be doing well with regard to her wound in general. Fortunately there is no signs of significant infection overall I feel like she is doing excellent in that regard. With that being said I think the Dakin's moistened gauze packing has done extremely well for her 5/13; 2-week follow-up. The patient has a deep wound on the lower sacral area however generally healthy looking surface over the bone here. There was no palpable bone no surrounding infection. I see that they have been using Dakin's moistened wet-to-dry. A wound VAC is apparently on hold. 10/20/2019 upon evaluation today patient's wound bed actually showed signs of good granulation and epithelization she seems to be doing excellent as far as the continuation of feeling of the wound is concerned there does not appear to be any significant slough or breakdown and no  significant infection at this point. Overall I am extremely pleased with where things stand today. Her family member who is here today is very happy to hear this. 11/03/2019 upon evaluation today patient appears to be doing excellent at this point with regard to her wound in general although is not significantly smaller in size there appears to be a lot of maceration. I think the biggest issue here may be that she really needs to have more of a dressing to help control the drainage. I think possibly silver alginate with dry gauze and behind may be ideal for her. 11/17/2019 upon evaluation today patient's wound actually appears to be doing quite well at this point. There is no evidence of active infection and overall I feel like this is doing quite nicely. We are still packing this with a silver alginate dressing. His granddaughter is present during evaluation today. 12/08/2019 upon evaluation today patient's wound actually appears to be doing quite well in regard to the overall appearance and progress at this point. There does not appear to be any signs of active infection we have been packing this with a silver alginate dressing which actually has done quite well for her to be honest. There is no evidence of worsening in general and in fact everything appears to be better especially in regard to depth. 12/19/2019 upon evaluation today patient's wound actually appears to be showing signs of good improvement. Fortunately there is no signs of active infection and overall she is doing quite well. No fevers, chills, nausea, vomiting, or diarrhea. Objective Constitutional Well-nourished and well-hydrated in no acute distress. Vitals Time Taken: 11:46 AM, Height: 67  in, Weight: 140 lbs, BMI: 21.9, Temperature: 98.1 F, Pulse: 95 bpm, Respiratory Rate: 22 breaths/min, Blood Pressure: 120/66 mmHg. Respiratory normal breathing without difficulty. Psychiatric Patient is not able to cooperate in decision  making regarding care. Patient has dementia. pleasant and cooperative. General Notes: Patient's wound bed actually showed signs of good granulation at this time and fortunately there is no evidence of active infection which is great news overall feel like the alginate is doing a good job we will get a recommend we continue for the time being. Integumentary (Hair, Skin) Wound #4 status is Open. Original cause of wound was Pressure Injury. The wound is located on the Midline Sacrum. The wound measures 3cm length x 2cm width x 0.1cm depth; 4.712cm^2 area and 0.471cm^3 volume. There is Fat Layer (Subcutaneous Tissue) Exposed exposed. There Heese, Patirica A. ([id number]) is no tunneling or undermining noted. There is a medium amount of serous drainage noted. The wound margin is epibole. There is large (67- 100%) red granulation within the wound bed. There is a small (1-33%) amount of necrotic tissue within the wound bed including Adherent Slough. Assessment Active Problems ICD-10 Pressure ulcer of sacral region, stage 3 Essential (primary) hypertension Vascular dementia without behavioral disturbance Plan Wound Cleansing: Wound #4 Midline Sacrum: Clean wound with Normal Saline. Cleanse wound with mild soap and water Primary Wound Dressing: Wound #4 Midline Sacrum: Silver Alginate Secondary Dressing: Wound #4 Midline Sacrum: ABD pad Dry Gauze - to fill space behind silver alginate Dressing Change Frequency: Wound #4 Midline Sacrum: Change dressing every day. Follow-up Appointments: Wound #4 Midline Sacrum: Return Appointment in 3 weeks. Off-Loading: Wound #4 Midline Sacrum: Roho cushion for wheelchair - pt needs Roho cushion for her chair Mattress - Pt needs Air mattress Turn and reposition every 2 hours - Pt is still getting pressure to her midline sacrum. Please reposition pt EVERY 2 HOURS 1. I would recommend at this point that we go ahead and continue with the wound care measures as  before specifically with regard to the silver alginate dressing to the wound bed followed by dry gauze just to ensure this maintains contact with the base of the wound and then subsequently a ABD pad secured with tape. 2. I am also can recommend continued offloading with a Roho cushion as well as alternating air mattress. 3. The patient needs to have continued and appropriate offloading with repositioning every 2 hours. We will see patient back for reevaluation in 2 weeks here in the clinic. If anything worsens or changes patient will contact our office for additional recommendations. Electronic Signature(s) Signed: 12/29/2019 1:13:40 PM By: Lenda Kelp PA-C Entered By: Lenda Kelp on 12/29/2019 13:13:40 Herrin, [name] ([id number]) -------------------------------------------------------------------------------- SuperBill Details Patient Name: [name] Lye A. Date of Service: 12/29/2019 Medical Record Number: [id number] Patient Account Number: [id number] Date of Birth/Sex: April 10, 1929 (86 y.o. F) Treating RN: Rodell Perna Primary Care Provider: Dewaine Oats Other Clinician: Referring Provider: TATE, Katherina Right Treating Provider/Extender: STONE III, Samiksha Pellicano Weeks in Treatment: 43 Diagnosis Coding ICD-10 Codes Code Description L89.153 Pressure ulcer of sacral region, stage 3 I10 Essential (primary) hypertension F01.50 Vascular dementia without behavioral disturbance Facility Procedures CPT4 Code: 08657846 Description: 99213 - WOUND CARE VISIT-LEV 3 EST PT Modifier: Quantity: 1 Physician Procedures CPT4 Code: 9629528 Description: 99213 - WC PHYS LEVEL 3 - EST PT Modifier: Quantity: 1 CPT4 Code: Description: ICD-10 Diagnosis Description L89.153 Pressure ulcer of sacral region, stage 3 I10 Essential (primary) hypertension F01.50 Vascular dementia without behavioral disturbance Modifier: Quantity: Electronic  Signature(s) Signed: 12/29/2019 1:13:53 PM By: Lenda Kelp PA-C Entered By:  Lenda Kelp on 12/29/2019 13:13:52

## 2019-12-29 NOTE — Progress Notes (Addendum)
WILMOTH, RASNIC ([id number]) Visit Report for 12/29/2019 Arrival Information Details Patient Name: [name] [name] [name] A. Date of Service: 12/29/2019 11:00 AM Medical Record Number: [id number] Patient Account Number: [id number] Date of Birth/Sex: 29-[name] [name]-1930 (86 y.o. F) Treating RN: Tyler Aas Primary Care Jalynn Betzold: Dewaine Oats Other Clinician: Referring Addis Bennie: Dewaine Oats Treating Tsering Leaman/Extender: Linwood Dibbles, HOYT Weeks in Treatment: 43 Visit Information History Since Last Visit Added or deleted any medications: No Patient Arrived: Wheel Chair Had a fall or experienced change in No Arrival Time: 11:46 activities of daily living that may affect Accompanied By: son risk of falls: Transfer Assistance: Nurse, adult Hospitalized since last visit: No Pain Present Now: No Electronic Signature(s) Signed: 12/29/2019 4:35:55 PM By: Tyler Aas Entered By: Tyler Aas on 12/29/2019 11:46:37 [name] [name] Stacks A. ([id number]) -------------------------------------------------------------------------------- Clinic Level of Care Assessment Details Patient Name: [name] [name] A. Date of Service: 12/29/2019 11:00 AM Medical Record Number: [id number] Patient Account Number: [id number] Date of Birth/Sex: [birth year] (86 y.o. F) Treating RN: Rodell Perna Primary Care Denise Washburn: Dewaine Oats Other Clinician: Referring Javione Gunawan: Dewaine Oats Treating Chelsei Mcchesney/Extender: Linwood Dibbles, HOYT Weeks in Treatment: 43 Clinic Level of Care Assessment Items TOOL 4 Quantity Score []  - Use when only an EandM is performed on FOLLOW-UP visit 0 ASSESSMENTS - Nursing Assessment / Reassessment X - Reassessment of Co-morbidities (includes updates in patient status) 1 10 X- 1 5 Reassessment of Adherence to Treatment Plan ASSESSMENTS - Wound and Skin Assessment / Reassessment X - Simple Wound Assessment / Reassessment - one wound 1 5 []  - 0 Complex Wound Assessment / Reassessment - multiple wounds []  - 0 Dermatologic / Skin  Assessment (not related to wound area) ASSESSMENTS - Focused Assessment []  - Circumferential Edema Measurements - multi extremities 0 []  - 0 Nutritional Assessment / Counseling / Intervention []  - 0 Lower Extremity Assessment (monofilament, tuning fork, pulses) []  - 0 Peripheral Arterial Disease Assessment (using hand held doppler) ASSESSMENTS - Ostomy and/or Continence Assessment and Care []  - Incontinence Assessment and Management 0 []  - 0 Ostomy Care Assessment and Management (repouching, etc.) PROCESS - Coordination of Care X - Simple Patient / Family Education for ongoing care 1 15 []  - 0 Complex (extensive) Patient / Family Education for ongoing care X- 1 10 Staff obtains , Records, Test Results / Process Orders []  - 0 Staff telephones HHA, Nursing Homes / Clarify orders / etc []  - 0 Routine Transfer to another Facility (non-emergent condition) []  - 0 Routine Hospital Admission (non-emergent condition) []  - 0 New Admissions / / Ordering NPWT, Apligraf, etc. []  - 0 Emergency Hospital Admission (emergent condition) X- 1 10 Simple Discharge Coordination []  - 0 Complex (extensive) Discharge Coordination PROCESS - Special Needs []  - Pediatric / Minor Patient Management 0 []  - 0 Isolation Patient Management []  - 0 Hearing / Language / Visual special needs []  - 0 Assessment of Community assistance (transportation, D/C planning, etc.) []  - 0 Additional assistance / Altered mentation []  - 0 Support Surface(s) Assessment (bed, cushion, seat, etc.) INTERVENTIONS - Wound Cleansing / Measurement Simonet, Kinsie A. ( ) X- 1 5 Simple Wound Cleansing - one wound []  - 0 Complex Wound Cleansing - multiple wounds X- 1 5 Wound Imaging (photographs - any number of wounds) []  - 0 Wound Tracing (instead of photographs) X- 1 5 Simple Wound Measurement - one wound []  - 0 Complex Wound Measurement - multiple wounds INTERVENTIONS - Wound  Dressings []  - Small Wound Dressing one or multiple wounds 0 X- 1 15 Medium  Wound Dressing one or multiple wounds []  - 0 Large Wound Dressing one or multiple wounds []  - 0 Application of Medications - topical []  - 0 Application of Medications - injection INTERVENTIONS - Miscellaneous []  - External ear exam 0 []  - 0 Specimen Collection (cultures, biopsies, blood, body fluids, etc.) []  - 0 Specimen(s) / Culture(s) sent or taken to Lab for analysis []  - 0 Patient Transfer (multiple staff / Nurse, adultHoyer Lift / Similar devices) []  - 0 Simple Staple / Suture removal (25 or less) []  - 0 Complex Staple / Suture removal (26 or more) []  - 0 Hypo / Hyperglycemic Management (close monitor of Blood Glucose) []  - 0 Ankle / Brachial Index (ABI) - do not check if billed separately X- 1 5 Vital Signs Has the patient been seen at the hospital within the last three years: Yes Total Score: 90 Level Of Care: New/Established - Level 3 Electronic Signature(s) Signed: 12/29/2019 3:43:33 PM By: Rodell PernaScott, Dajea Entered By: Rodell PernaScott, Dajea on 12/29/2019 11:59:17 [name] [name] A. ([id number]) -------------------------------------------------------------------------------- Complex / Palliative Patient Assessment Details Patient Name: [name] LyeHALL, [name] Date of Service: 12/29/2019 11:00 AM Medical Record Number: [id number] Patient Account Number: [id number] Date of Birth/Sex: 04/02/1929 (86 y.o. F) Treating RN: Rodell PernaScott, Dajea Primary Care Masson Nalepa: Dewaine OatsATE, DENNY Other Clinician: Referring Michail Boyte: Dewaine OatsATE, DENNY Treating Cailen Mihalik/Extender: Rowan BlaseStone, Hoyt Weeks in Treatment: 43 Palliative Management Criteria Complex Wound Management Criteria Patient has remarkable or complex co-morbidities requiring medications or treatments that extend wound healing times. Examples: o Diabetes mellitus with chronic renal failure or end stage renal disease requiring dialysis o Advanced or poorly controlled rheumatoid arthritis o Diabetes  mellitus and end stage chronic obstructive pulmonary disease o Active cancer with current chemo- or radiation therapy Bed bound and cannot rotate herself in the bed. Currently in Skilled Nursing Facility Care Approach Wound Care Plan: Complex Wound Management Electronic Signature(s) Signed: 03/16/2020 9:05:48 AM By: Lenda KelpStone III, Hoyt PA-C Entered By: Lenda KelpStone III, Hoyt on 03/16/2020 09:05:48 [name] [name] [name] A. ([id number]) -------------------------------------------------------------------------------- Encounter Discharge Information Details Patient Name: [name] LyeHALL, [name] Date of Service: 12/29/2019 11:00 AM Medical Record Number: [id number] Patient Account Number: [id number] Date of Birth/Sex: 01/29/1929 (86 y.o. F) Treating RN: Rodell PernaScott, Dajea Primary Care Sharalyn Lomba: Dewaine OatsATE, DENNY Other Clinician: Referring Jabier Deese: Dewaine OatsATE, DENNY Treating Dathan Attia/Extender: Linwood DibblesSTONE III, HOYT Weeks in Treatment: 4843 Encounter Discharge Information Items Discharge Condition: Stable Ambulatory Status: Wheelchair Discharge Destination: Home Transportation: Private Auto Accompanied By: self Schedule Follow-up Appointment: Yes Clinical Summary of Care: Electronic Signature(s) Signed: 12/29/2019 12:00:01 PM By: Rodell PernaScott, Dajea Entered By: Rodell PernaScott, Dajea on 12/29/2019 12:00:01 [name] LyeHALL, Huyen A. ([id number]) -------------------------------------------------------------------------------- Lower Extremity Assessment Details Patient Name: [name] LyeHALL, [name] Date of Service: 12/29/2019 11:00 AM Medical Record Number: [id number] Patient Account Number: [id number] Date of Birth/Sex: 08/20/1928 (86 y.o. F) Treating RN: Tyler AasButler, Michelle Primary Care Jiayi Lengacher: Dewaine OatsATE, DENNY Other Clinician: Referring Abrish Erny: Dewaine OatsATE, DENNY Treating Khiara Shuping/Extender: Linwood DibblesSTONE III, HOYT Weeks in Treatment: 53: 43 Electronic Signature(s) Signed: 12/29/2019 4:35:55 PM By: Tyler AasButler, Michelle Entered By: Tyler AasButler, Michelle on 12/29/2019 11:49:00 Vanderstelt, [name] StacksWILMA A.  ([id number]) -------------------------------------------------------------------------------- Multi Wound Chart Details Patient Name: [name] LyeHALL, [name] Date of Service: 12/29/2019 11:00 AM Medical Record Number: [id number] Patient Account Number: [id number] Date of Birth/Sex: 04/20/1929 (86 y.o. F) Treating RN: Rodell PernaScott, Dajea Primary Care Alyona Romack: Dewaine OatsATE, DENNY Other Clinician: Referring Jshon Ibe: TATE, Katherina RightENNY Treating Marjo Grosvenor/Extender: STONE III, HOYT Weeks in Treatment: 43 Vital Signs Height(in): 67 Pulse(bpm): 95 Weight(lbs): 140 Blood Pressure(mmHg): 120/66 Body Mass Index(BMI): 22 Temperature(F): 98.1 Respiratory Rate(breaths/min): 22 Photos: [N/A:N/A] Wound Location: Midline Sacrum  N/A N/A Wounding Event: Pressure Injury N/A N/A Primary Etiology: Pressure Ulcer N/A N/A Comorbid History: Glaucoma, Hypertension, N/A N/A Osteoarthritis, Dementia Date Acquired: 07/18/2018 N/A N/A Weeks of Treatment: 31 N/A N/A Wound Status: Open N/A N/A Clustered Wound: Yes N/A N/A Clustered Quantity: 1 N/A N/A Measurements L x W x D (cm) 3x2x0.1 N/A N/A Area (cm) : 4.712 N/A N/A Volume (cm) : 0.471 N/A N/A % Reduction in Area: 72.70% N/A N/A % Reduction in Volume: 72.70% N/A N/A Classification: Category/Stage IV N/A N/A Exudate Amount: Medium N/A N/A Exudate Type: Serous N/A N/A Exudate Color: amber N/A N/A Wound Margin: Epibole N/A N/A Granulation Amount: Large (67-100%) N/A N/A Granulation Quality: Red N/A N/A Necrotic Amount: Small (1-33%) N/A N/A Exposed Structures: Fat Layer (Subcutaneous Tissue): N/A N/A Yes Fascia: No Tendon: No Muscle: No Joint: No Bone: No Epithelialization: Small (1-33%) N/A N/A Treatment Notes Electronic Signature(s) Signed: 12/29/2019 11:58:03 AM By: Rodell Perna Entered By: Rodell Perna on 12/29/2019 11:58:03 Monty, [name] [name] ([id number]) -------------------------------------------------------------------------------- Multi-Disciplinary Care Plan  Details Patient Name: [name] [name] A. Date of Service: 12/29/2019 11:00 AM Medical Record Number: [id number] Patient Account Number: [id number] Date of Birth/Sex: 1928-05-29 (86 y.o. F) Treating RN: Rodell Perna Primary Care Anthea Udovich: Dewaine Oats Other Clinician: Referring Glendale Wherry: Dewaine Oats Treating Calyse Murcia/Extender: Linwood Dibbles, HOYT Weeks in Treatment: 15 Active Inactive Abuse / Safety / Falls / Self Care Management Nursing Diagnoses: Potential for falls Goals: Patient will remain injury free related to falls Date Initiated: 03/03/2019 Target Resolution Date: 03/16/2019 Goal Status: Active Interventions: Assess fall risk on admission and as needed Notes: Medication Nursing Diagnoses: Knowledge deficit related to medication safety: actual or potential Goals: Patient/caregiver will demonstrate understanding of all current medications Date Initiated: 03/03/2019 Target Resolution Date: 03/16/2019 Goal Status: Active Interventions: Assess for medication contraindications each visit where new medications are prescribed Treatment Activities: New medication prescribed at Wound Center : 03/03/2019 Notes: Necrotic Tissue Nursing Diagnoses: Impaired tissue integrity related to necrotic/devitalized tissue Goals: Necrotic/devitalized tissue will be minimized in the wound bed Date Initiated: 03/03/2019 Target Resolution Date: 03/16/2019 Goal Status: Active Interventions: Assess patient pain level pre-, during and post procedure and prior to discharge Treatment Activities: Apply topical anesthetic as ordered : 03/03/2019 Notes: Nutrition Nursing Diagnoses: Potential for alteratiion in Nutrition/Potential for imbalanced nutrition Kamps, Autumne A. ([id number]) Goals: Patient/caregiver agrees to and verbalizes understanding of need to use nutritional supplements and/or vitamins as prescribed Date Initiated: 03/03/2019 Target Resolution Date: 03/16/2019 Goal Status:  Active Interventions: Provide education on nutrition Notes: Pressure Nursing Diagnoses: Knowledge deficit related to management of pressures ulcers Goals: Patient/caregiver will verbalize understanding of pressure ulcer management Date Initiated: 03/03/2019 Target Resolution Date: 03/16/2019 Goal Status: Active Interventions: Assess: immobility, friction, shearing, incontinence upon admission and as needed Provide education on pressure ulcers Treatment Activities: Patient referred for home evaluation of offloading devices/mattresses : 03/03/2019 Patient referred for pressure reduction/relief devices : 03/03/2019 Pressure reduction/relief device ordered : 03/03/2019 Notes: Wound/Skin Impairment Nursing Diagnoses: Impaired tissue integrity Goals: Ulcer/skin breakdown will have a volume reduction of 30% by week 4 Date Initiated: 03/03/2019 Target Resolution Date: 03/03/2019 Goal Status: Active Interventions: Assess ulceration(s) every visit Treatment Activities: Skin care regimen initiated : 03/03/2019 Notes: Electronic Signature(s) Signed: 12/29/2019 11:57:54 AM By: Rodell Perna Entered By: Rodell Perna on 12/29/2019 11:57:53 [name] [name] A. ([id number]) -------------------------------------------------------------------------------- Pain Assessment Details Patient Name: [name] [name] A. Date of Service: 12/29/2019 11:00 AM Medical Record Number: [id number] Patient Account Number: [id number] Date of Birth/Sex: Oct 01, 1928 (86 y.o. F) Treating RN:  Tyler Aas Primary Care Amond Speranza: Dewaine Oats Other Clinician: Referring Atom Solivan: Dewaine Oats Treating Bryonna Sundby/Extender: Linwood Dibbles, HOYT Weeks in Treatment: 26 Active Problems Location of Pain Severity and Description of Pain Patient Has Paino No Site Locations Pain Management and Medication Current Pain Management: Electronic Signature(s) Signed: 12/29/2019 4:35:55 PM By: Tyler Aas Entered By: Tyler Aas on  12/29/2019 11:47:16 [name], [name] [name] ([id number]) -------------------------------------------------------------------------------- Patient/Caregiver Education Details Patient Name: [name] [name] A. Date of Service: 12/29/2019 11:00 AM Medical Record Number: [id number] Patient Account Number: [id number] Date of Birth/Gender: 11-23-1928 (86 y.o. F) Treating RN: Rodell Perna Primary Care Physician: Dewaine Oats Other Clinician: Referring Physician: Dewaine Oats Treating Physician/Extender: Skeet Simmer in Treatment: 47 Education Assessment Education Provided To: Patient Education Topics Provided Wound/Skin Impairment: Handouts: Caring for Your Ulcer Methods: Demonstration, Explain/Verbal Responses: State content correctly Electronic Signature(s) Signed: 12/29/2019 3:43:33 PM By: Rodell Perna Entered By: Rodell Perna on 12/29/2019 11:59:31 Novak, Loryn A. ([id number]) -------------------------------------------------------------------------------- Wound Assessment Details Patient Name: [name] [name] A. Date of Service: 12/29/2019 11:00 AM Medical Record Number: [id number] Patient Account Number: [id number] Date of Birth/Sex: Jun 01, 1928 (86 y.o. F) Treating RN: Tyler Aas Primary Care Dell Hurtubise: Dewaine Oats Other Clinician: Referring Cinzia Devos: Dewaine Oats Treating Lassie Demorest/Extender: Linwood Dibbles, HOYT Weeks in Treatment: 43 Wound Status Wound Number: 4 Primary Etiology: Pressure Ulcer Wound Location: Midline Sacrum Wound Status: Open Wounding Event: Pressure Injury Comorbid History: Glaucoma, Hypertension, Osteoarthritis, Dementia Date Acquired: 07/18/2018 Weeks Of Treatment: 43 Clustered Wound: Yes Photos Wound Measurements Length: (cm) 3 Width: (cm) 2 Depth: (cm) 0.1 Clustered Quantity: 1 Area: (cm) 4.712 Volume: (cm) 0.471 % Reduction in Area: 72.7% % Reduction in Volume: 72.7% Epithelialization: Small (1-33%) Tunneling: No Undermining: No Wound  Description Classification: Category/Stage IV Wound Margin: Epibole Exudate Amount: Medium Exudate Type: Serous Exudate Color: amber Foul Odor After Cleansing: No Slough/Fibrino Yes Wound Bed Granulation Amount: Large (67-100%) Exposed Structure Granulation Quality: Red Fascia Exposed: No Necrotic Amount: Small (1-33%) Fat Layer (Subcutaneous Tissue) Exposed: Yes Necrotic Quality: Adherent Slough Tendon Exposed: No Muscle Exposed: No Joint Exposed: No Bone Exposed: No Treatment Notes Wound #4 (Midline Sacrum) Notes silver ag, dry gauze, ABD and tape to midline sacrum ADREANNE, YONO A. ([id number]) Electronic Signature(s) Signed: 12/29/2019 4:35:55 PM By: Tyler Aas Entered By: Tyler Aas on 12/29/2019 11:48:44 [name] [name] Stacks A. ([id number]) -------------------------------------------------------------------------------- Vitals Details Patient Name: [name] [name] A. Date of Service: 12/29/2019 11:00 AM Medical Record Number: [id number] Patient Account Number: [id number] Date of Birth/Sex: [birth year] (86 y.o. F) Treating RN: Tyler Aas Primary Care Ariz Terrones: Dewaine Oats Other Clinician: Referring Bakary Bramer: Dewaine Oats Treating Adalena Abdulla/Extender: Linwood Dibbles, HOYT Weeks in Treatment: 43 Vital Signs Time Taken: 11:46 Temperature (F): 98.1 Height (in): 67 Pulse (bpm): 95 Weight (lbs): 140 Respiratory Rate (breaths/min): 22 Body Mass Index (BMI): 21.9 Blood Pressure (mmHg): 120/66 Reference Range: 80 - 120 mg / dl Electronic Signature(s) Signed: 12/29/2019 4:35:55 PM By: Tyler Aas Entered By: Tyler Aas on 12/29/2019 11:47:08

## 2020-01-19 ENCOUNTER — Ambulatory Visit: Payer: Medicare Other | Admitting: Physician Assistant

## 2020-04-20 ENCOUNTER — Ambulatory Visit: Payer: Medicare Other | Admitting: Physician Assistant

## 2020-06-03 IMAGING — CT CT MAXILLOFACIAL WITHOUT CONTRAST
4 of 13 series · 16 of 47 positions shown, 18 images · non-contrast
Comparison: 05/01/2018 head CT

CLINICAL DATA: Fall with head injury.  Dementia

EXAM:
CT HEAD WITHOUT CONTRAST
CT MAXILLOFACIAL WITHOUT CONTRAST
CT CERVICAL SPINE WITHOUT CONTRAST
TECHNIQUE: Multidetector CT imaging of the head, cervical spine, and
maxillofacial structures were performed using the standard protocol
without intravenous contrast. Multiplanar CT image reconstructions
of the cervical spine and maxillofacial structures were also
generated.

[Series 4: c spine soft · axial · 0.35mm/px · z∈[-194,-78]mm · 5 of 87 slices shown]
[im 15/87  brain]
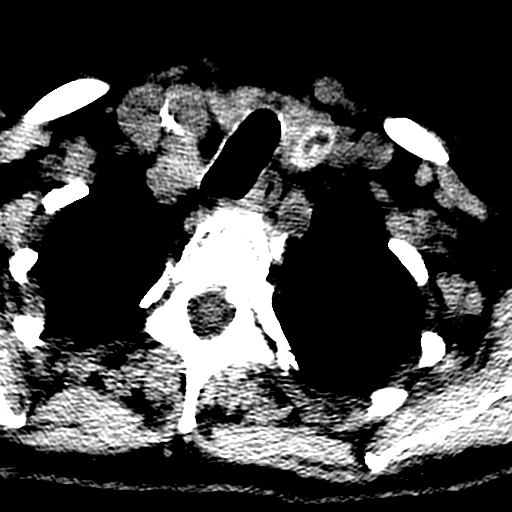
[im 29/87  brain]
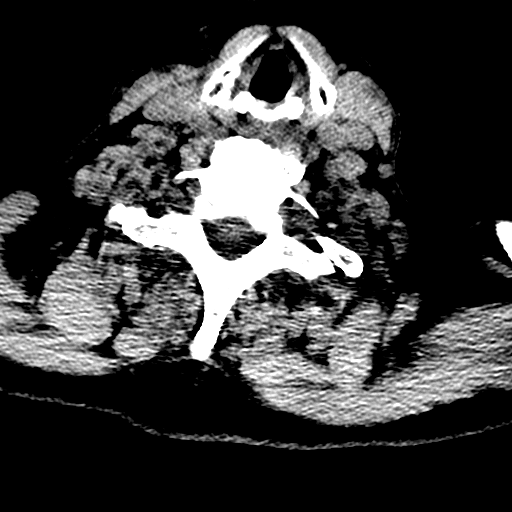
[im 44/87  brain]
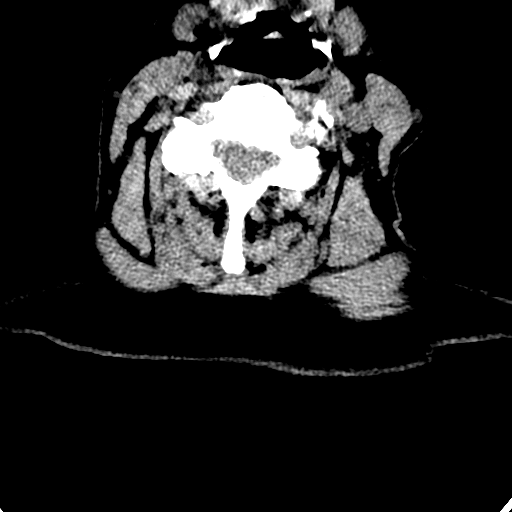
[im 58/87  brain]
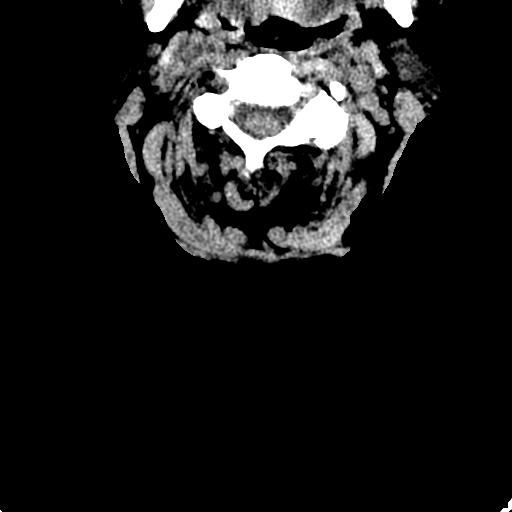
[im 72/87  brain]
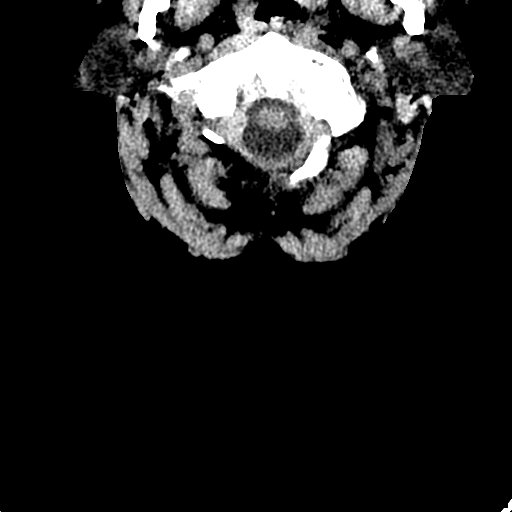

[Series 6: max soft · axial · 0.33mm/px · z∈[-128,-74]mm · 3 of 81 slices shown]
[im 14/81  brain]
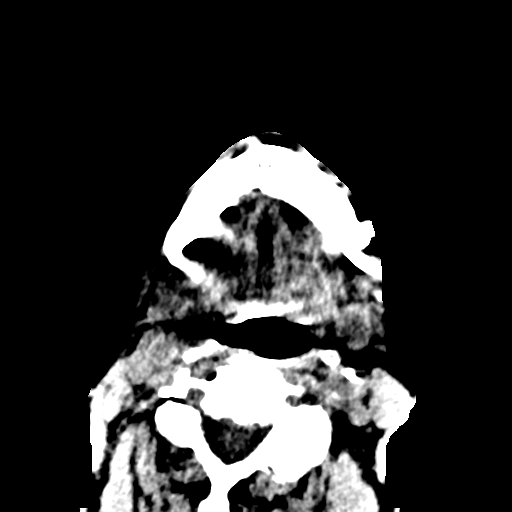
[im 27/81  brain]
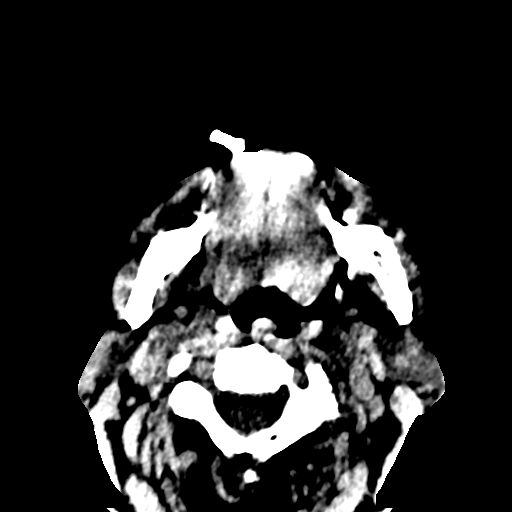
[im 41/81  brain]
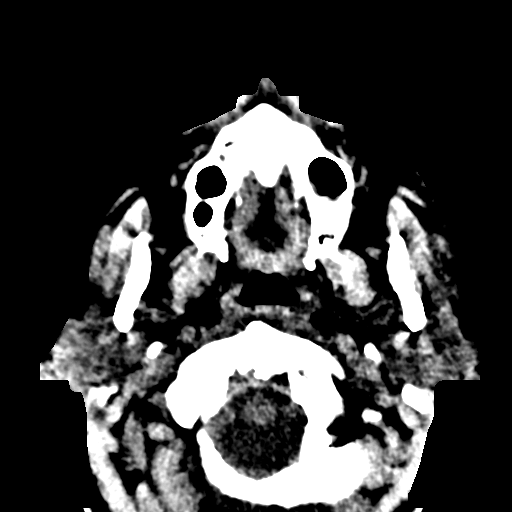

[Series 14: orthogonal axials · axial · 0.24mm/px · z∈[-212,-97]mm · 6 of 95 slices shown, 8 images]
[im 14/95  brain]
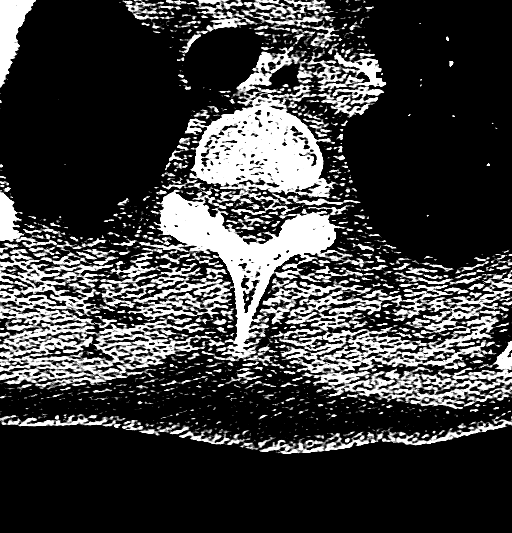
[im 14/95  bone]
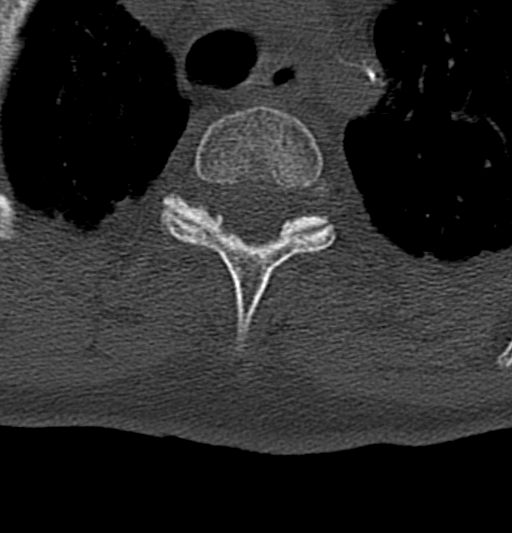
[im 27/95  bone]
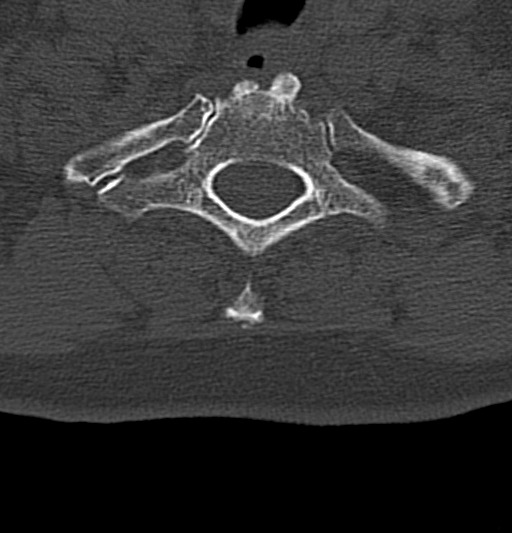
[im 41/95  bone]
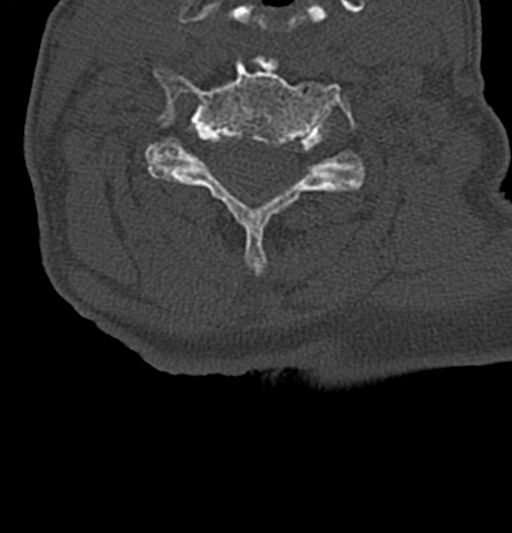
[im 54/95  bone]
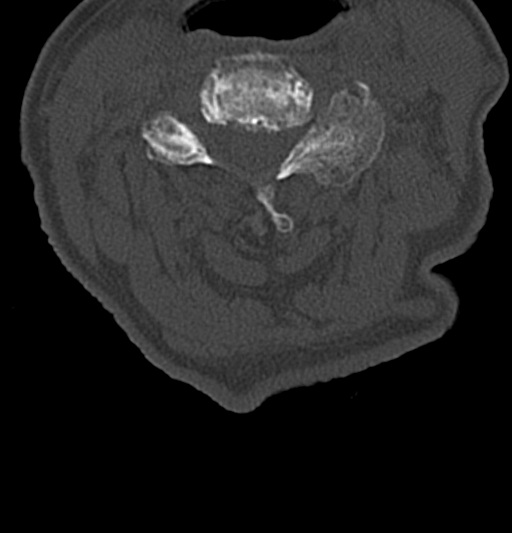
[im 68/95  brain]
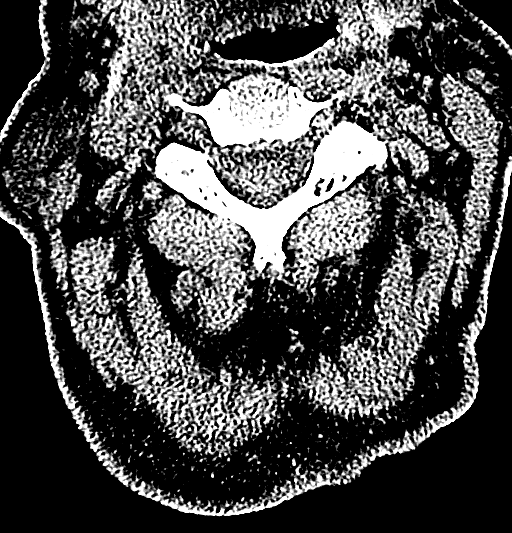
[im 68/95  bone]
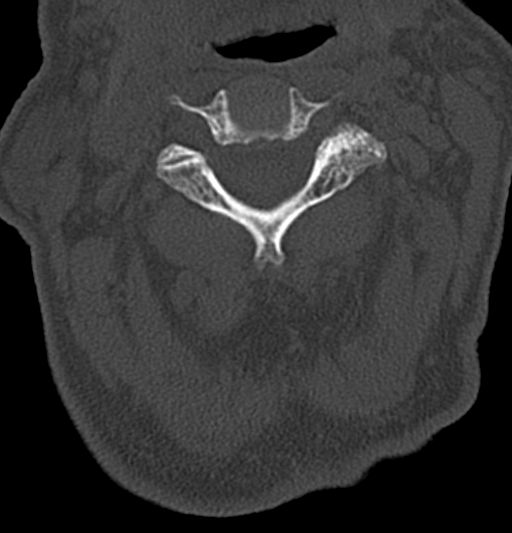
[im 81/95  bone]
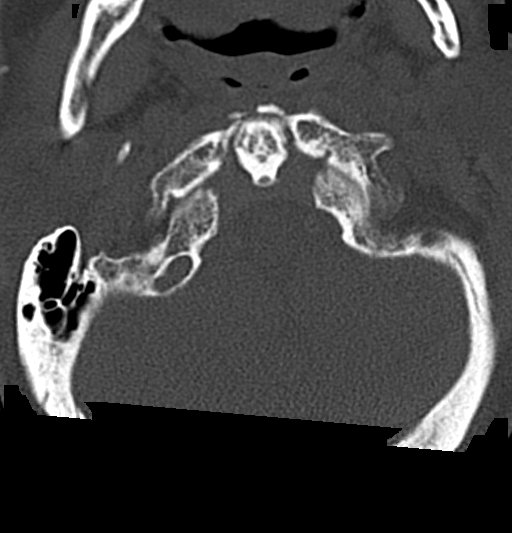

[Series 20: coronal soft · coronal · 0.32mm/px · 2 of 59 slices shown]
[im 20/59  bone]
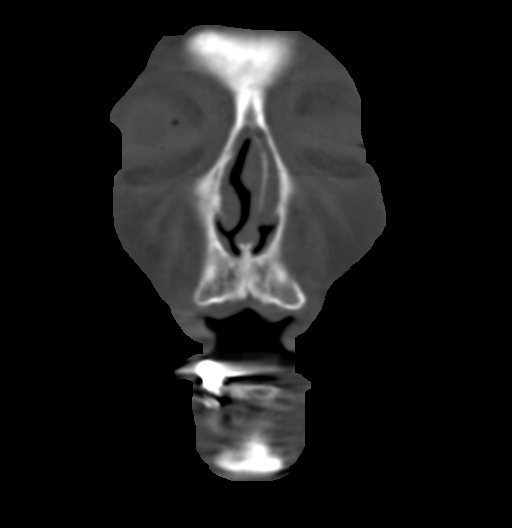
[im 39/59  bone]
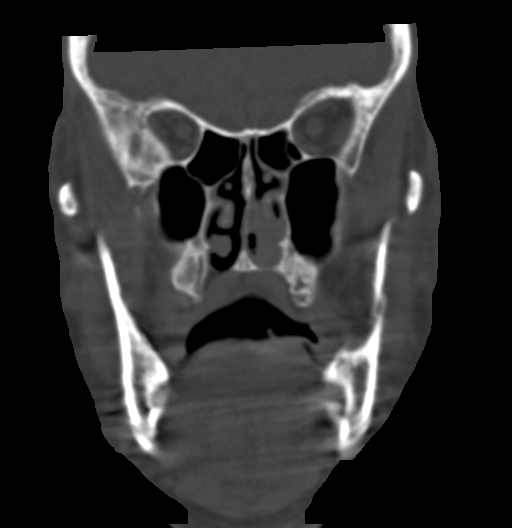

[16 of 47 positions shown; findings below may reference images not displayed]

FINDINGS: CT HEAD FINDINGS

Brain: No evidence of acute infarction, hemorrhage, hydrocephalus,
extra-axial collection or mass lesion/mass effect. Moderate area of
encephalomalacia in the inferior right temporal to occipital lobe
which could be ischemic or posttraumatic. Atrophy and mild chronic
small vessel ischemia for age.

Vascular: No hyperdense vessel or unexpected calcification.

Skull: Negative for fracture

CT MAXILLOFACIAL FINDINGS

Osseous: Motion artifact, especially affecting the mandible. No
acute fracture.

Orbits: No evidence of injury. Bilateral cataract resection and AP
elongation of the globes

Sinuses: Negative for hemosinus. There is prominent leftward nasal
septal deviation. Frothy material in the nasopharynx and left nasal
cavity which may be epistaxis.

Soft tissues: Possible soft tissue swelling over the nasal bridge.

CT CERVICAL SPINE FINDINGS

Alignment: No traumatic malalignment. Mild, degenerative, fused
anterolisthesis at C4-5.

Skull base and vertebrae: Negative for acute fracture

Soft tissues and spinal canal: No prevertebral fluid or swelling. No
visible canal hematoma. Incidental left thyroid nodule.

Disc levels: Generalized disc narrowing and facet spurring.
Ankylosis of the most hypertrophic facet on the left at C4-5.

Upper chest: No acute finding
IMPRESSION: 1. No evidence of acute intracranial or cervical spine injury.
2. Motion degraded face CT that is negative for fracture.

## 2020-08-17 DEATH — deceased
